# Patient Record
Sex: Male | Born: 1955
Health system: Southern US, Community
[De-identification: ages and names within clinical notes are randomized; demographics above are authoritative.]

## PROBLEM LIST (undated history)

## (undated) DIAGNOSIS — I469 Cardiac arrest, cause unspecified: Secondary | ICD-10-CM

## (undated) DIAGNOSIS — R011 Cardiac murmur, unspecified: Secondary | ICD-10-CM

## (undated) DIAGNOSIS — I2510 Atherosclerotic heart disease of native coronary artery without angina pectoris: Secondary | ICD-10-CM

## (undated) DIAGNOSIS — J449 Chronic obstructive pulmonary disease, unspecified: Secondary | ICD-10-CM

## (undated) DIAGNOSIS — I219 Acute myocardial infarction, unspecified: Secondary | ICD-10-CM

## (undated) DIAGNOSIS — F32A Depression, unspecified: Secondary | ICD-10-CM

## (undated) DIAGNOSIS — K219 Gastro-esophageal reflux disease without esophagitis: Secondary | ICD-10-CM

## (undated) DIAGNOSIS — E119 Type 2 diabetes mellitus without complications: Secondary | ICD-10-CM

## (undated) DIAGNOSIS — N189 Chronic kidney disease, unspecified: Secondary | ICD-10-CM

## (undated) DIAGNOSIS — G4730 Sleep apnea, unspecified: Secondary | ICD-10-CM

## (undated) DIAGNOSIS — E785 Hyperlipidemia, unspecified: Secondary | ICD-10-CM

## (undated) DIAGNOSIS — F1020 Alcohol dependence, uncomplicated: Secondary | ICD-10-CM

## (undated) DIAGNOSIS — M259 Joint disorder, unspecified: Secondary | ICD-10-CM

## (undated) DIAGNOSIS — I495 Sick sinus syndrome: Secondary | ICD-10-CM

## (undated) DIAGNOSIS — G250 Essential tremor: Secondary | ICD-10-CM

## (undated) DIAGNOSIS — J45909 Unspecified asthma, uncomplicated: Secondary | ICD-10-CM

## (undated) DIAGNOSIS — I10 Essential (primary) hypertension: Secondary | ICD-10-CM

## (undated) DIAGNOSIS — I499 Cardiac arrhythmia, unspecified: Secondary | ICD-10-CM

## (undated) DIAGNOSIS — K589 Irritable bowel syndrome without diarrhea: Secondary | ICD-10-CM

## (undated) HISTORY — PX: SHOULDER SURGERY: SHX246

## (undated) HISTORY — PX: CORONARY ANGIOPLASTY WITH STENT PLACEMENT: SHX49

## (undated) HISTORY — PX: SIGMOIDOSCOPY: SHX6686

## (undated) HISTORY — PX: RHINOPLASTY: SUR1284

---

## 2006-01-18 ENCOUNTER — Ambulatory Visit: Payer: Self-pay | Admitting: Internal Medicine

## 2006-03-08 ENCOUNTER — Ambulatory Visit: Payer: Self-pay | Admitting: Internal Medicine

## 2007-02-01 ENCOUNTER — Ambulatory Visit: Payer: Self-pay | Admitting: Rheumatology

## 2007-04-30 ENCOUNTER — Ambulatory Visit: Payer: Self-pay | Admitting: Orthopaedic Surgery

## 2007-06-26 ENCOUNTER — Other Ambulatory Visit: Payer: Self-pay

## 2007-06-26 ENCOUNTER — Emergency Department: Payer: Self-pay | Admitting: Emergency Medicine

## 2007-06-27 ENCOUNTER — Other Ambulatory Visit: Payer: Self-pay

## 2007-07-01 ENCOUNTER — Ambulatory Visit: Payer: Self-pay | Admitting: Emergency Medicine

## 2007-07-11 ENCOUNTER — Inpatient Hospital Stay: Payer: Self-pay | Admitting: Internal Medicine

## 2007-07-11 ENCOUNTER — Other Ambulatory Visit: Payer: Self-pay

## 2008-04-02 ENCOUNTER — Ambulatory Visit: Payer: Medicare Other | Admitting: Internal Medicine

## 2008-05-29 DIAGNOSIS — M259 Joint disorder, unspecified: Secondary | ICD-10-CM

## 2008-05-29 HISTORY — DX: Joint disorder, unspecified: M25.9

## 2009-06-08 ENCOUNTER — Inpatient Hospital Stay: Payer: Self-pay | Admitting: Internal Medicine

## 2010-06-17 ENCOUNTER — Ambulatory Visit: Payer: Self-pay | Admitting: Internal Medicine

## 2011-11-22 DIAGNOSIS — I2510 Atherosclerotic heart disease of native coronary artery without angina pectoris: Secondary | ICD-10-CM | POA: Insufficient documentation

## 2011-12-20 DIAGNOSIS — Z9889 Other specified postprocedural states: Secondary | ICD-10-CM | POA: Insufficient documentation

## 2013-09-24 ENCOUNTER — Ambulatory Visit: Payer: Self-pay | Admitting: Unknown Physician Specialty

## 2013-10-15 ENCOUNTER — Ambulatory Visit: Payer: Self-pay | Admitting: Unknown Physician Specialty

## 2013-10-17 LAB — PATHOLOGY REPORT

## 2013-12-16 ENCOUNTER — Ambulatory Visit: Payer: Self-pay | Admitting: Unknown Physician Specialty

## 2014-03-25 DIAGNOSIS — E119 Type 2 diabetes mellitus without complications: Secondary | ICD-10-CM | POA: Insufficient documentation

## 2014-04-17 DIAGNOSIS — E782 Mixed hyperlipidemia: Secondary | ICD-10-CM | POA: Insufficient documentation

## 2014-10-12 DIAGNOSIS — I10 Essential (primary) hypertension: Secondary | ICD-10-CM | POA: Insufficient documentation

## 2015-03-12 ENCOUNTER — Ambulatory Visit: Payer: 59 | Attending: Specialist

## 2015-03-12 DIAGNOSIS — G4733 Obstructive sleep apnea (adult) (pediatric): Secondary | ICD-10-CM | POA: Insufficient documentation

## 2015-04-03 ENCOUNTER — Ambulatory Visit: Payer: 59 | Attending: Specialist

## 2015-04-03 DIAGNOSIS — G4761 Periodic limb movement disorder: Secondary | ICD-10-CM | POA: Diagnosis not present

## 2015-04-03 DIAGNOSIS — G4733 Obstructive sleep apnea (adult) (pediatric): Secondary | ICD-10-CM | POA: Diagnosis present

## 2016-01-27 ENCOUNTER — Inpatient Hospital Stay
Admission: EM | Admit: 2016-01-27 | Discharge: 2016-01-28 | DRG: 392 | Disposition: A | Discharge status: Home / Self Care | Payer: 59 | Attending: Internal Medicine | Admitting: Internal Medicine

## 2016-01-27 ENCOUNTER — Encounter: Payer: Self-pay | Admitting: Emergency Medicine

## 2016-01-27 ENCOUNTER — Emergency Department: Payer: 59

## 2016-01-27 DIAGNOSIS — Z9889 Other specified postprocedural states: Secondary | ICD-10-CM | POA: Diagnosis not present

## 2016-01-27 DIAGNOSIS — R739 Hyperglycemia, unspecified: Secondary | ICD-10-CM

## 2016-01-27 DIAGNOSIS — N189 Chronic kidney disease, unspecified: Secondary | ICD-10-CM | POA: Diagnosis present

## 2016-01-27 DIAGNOSIS — Z79899 Other long term (current) drug therapy: Secondary | ICD-10-CM | POA: Diagnosis not present

## 2016-01-27 DIAGNOSIS — F1021 Alcohol dependence, in remission: Secondary | ICD-10-CM | POA: Diagnosis present

## 2016-01-27 DIAGNOSIS — F329 Major depressive disorder, single episode, unspecified: Secondary | ICD-10-CM | POA: Diagnosis present

## 2016-01-27 DIAGNOSIS — T380X5A Adverse effect of glucocorticoids and synthetic analogues, initial encounter: Secondary | ICD-10-CM | POA: Diagnosis present

## 2016-01-27 DIAGNOSIS — Z87891 Personal history of nicotine dependence: Secondary | ICD-10-CM

## 2016-01-27 DIAGNOSIS — R1110 Vomiting, unspecified: Secondary | ICD-10-CM

## 2016-01-27 DIAGNOSIS — Z880 Allergy status to penicillin: Secondary | ICD-10-CM

## 2016-01-27 DIAGNOSIS — Z825 Family history of asthma and other chronic lower respiratory diseases: Secondary | ICD-10-CM | POA: Diagnosis not present

## 2016-01-27 DIAGNOSIS — I2510 Atherosclerotic heart disease of native coronary artery without angina pectoris: Secondary | ICD-10-CM | POA: Diagnosis present

## 2016-01-27 DIAGNOSIS — Z955 Presence of coronary angioplasty implant and graft: Secondary | ICD-10-CM | POA: Diagnosis not present

## 2016-01-27 DIAGNOSIS — E1165 Type 2 diabetes mellitus with hyperglycemia: Secondary | ICD-10-CM | POA: Diagnosis present

## 2016-01-27 DIAGNOSIS — Z809 Family history of malignant neoplasm, unspecified: Secondary | ICD-10-CM | POA: Diagnosis not present

## 2016-01-27 DIAGNOSIS — K529 Noninfective gastroenteritis and colitis, unspecified: Secondary | ICD-10-CM | POA: Diagnosis present

## 2016-01-27 DIAGNOSIS — L501 Idiopathic urticaria: Secondary | ICD-10-CM | POA: Diagnosis present

## 2016-01-27 DIAGNOSIS — Z8674 Personal history of sudden cardiac arrest: Secondary | ICD-10-CM | POA: Diagnosis not present

## 2016-01-27 DIAGNOSIS — Z8249 Family history of ischemic heart disease and other diseases of the circulatory system: Secondary | ICD-10-CM | POA: Diagnosis not present

## 2016-01-27 DIAGNOSIS — I129 Hypertensive chronic kidney disease with stage 1 through stage 4 chronic kidney disease, or unspecified chronic kidney disease: Secondary | ICD-10-CM | POA: Diagnosis present

## 2016-01-27 DIAGNOSIS — I252 Old myocardial infarction: Secondary | ICD-10-CM

## 2016-01-27 DIAGNOSIS — J45909 Unspecified asthma, uncomplicated: Secondary | ICD-10-CM | POA: Diagnosis present

## 2016-01-27 DIAGNOSIS — G4733 Obstructive sleep apnea (adult) (pediatric): Secondary | ICD-10-CM | POA: Diagnosis present

## 2016-01-27 DIAGNOSIS — E1122 Type 2 diabetes mellitus with diabetic chronic kidney disease: Secondary | ICD-10-CM | POA: Diagnosis present

## 2016-01-27 HISTORY — DX: Joint disorder, unspecified: M25.9

## 2016-01-27 HISTORY — DX: Cardiac arrest, cause unspecified: I46.9

## 2016-01-27 HISTORY — DX: Unspecified asthma, uncomplicated: J45.909

## 2016-01-27 HISTORY — DX: Atherosclerotic heart disease of native coronary artery without angina pectoris: I25.10

## 2016-01-27 HISTORY — DX: Acute myocardial infarction, unspecified: I21.9

## 2016-01-27 HISTORY — DX: Type 2 diabetes mellitus without complications: E11.9

## 2016-01-27 LAB — CBC
HEMATOCRIT: 51.7 % (ref 40.0–52.0)
HEMOGLOBIN: 18 g/dL (ref 13.0–18.0)
MCH: 30.3 pg (ref 26.0–34.0)
MCHC: 34.8 g/dL (ref 32.0–36.0)
MCV: 87.1 fL (ref 80.0–100.0)
Platelets: 188 10*3/uL (ref 150–440)
RBC: 5.93 MIL/uL — ABNORMAL HIGH (ref 4.40–5.90)
RDW: 15.1 % — ABNORMAL HIGH (ref 11.5–14.5)
WBC: 17.9 10*3/uL — ABNORMAL HIGH (ref 3.8–10.6)

## 2016-01-27 LAB — COMPREHENSIVE METABOLIC PANEL
ALBUMIN: 4.4 g/dL (ref 3.5–5.0)
ALK PHOS: 140 U/L — AB (ref 38–126)
ALT: 46 U/L (ref 17–63)
ANION GAP: 11 (ref 5–15)
AST: 35 U/L (ref 15–41)
BUN: 15 mg/dL (ref 6–20)
CALCIUM: 9.5 mg/dL (ref 8.9–10.3)
CHLORIDE: 103 mmol/L (ref 101–111)
CO2: 22 mmol/L (ref 22–32)
Creatinine, Ser: 1.09 mg/dL (ref 0.61–1.24)
GFR calc Af Amer: >60 mL/min (ref >60)
GFR calc non Af Amer: >60 mL/min (ref >60)
GLUCOSE: 310 mg/dL — AB (ref 65–99)
POTASSIUM: 4.2 mmol/L (ref 3.5–5.1)
SODIUM: 136 mmol/L (ref 135–145)
Total Bilirubin: 1.2 mg/dL (ref 0.3–1.2)
Total Protein: 7.6 g/dL (ref 6.5–8.1)

## 2016-01-27 LAB — URINALYSIS COMPLETE WITH MICROSCOPIC (ARMC ONLY)
BACTERIA UA: NONE SEEN
Bilirubin Urine: NEGATIVE
Glucose, UA: >500 mg/dL — AB
Hgb urine dipstick: NEGATIVE
Leukocytes, UA: NEGATIVE
Nitrite: NEGATIVE
PH: 5 (ref 5.0–8.0)
PROTEIN: 30 mg/dL — AB
SQUAMOUS EPITHELIAL / LPF: NONE SEEN
Specific Gravity, Urine: 1.03 (ref 1.005–1.030)

## 2016-01-27 LAB — GLUCOSE, CAPILLARY
GLUCOSE-CAPILLARY: 308 mg/dL — AB (ref 65–99)
GLUCOSE-CAPILLARY: 190 mg/dL — AB (ref 65–99)
GLUCOSE-CAPILLARY: 164 mg/dL — AB (ref 65–99)
Glucose-Capillary: 252 mg/dL — ABNORMAL HIGH (ref 65–99)

## 2016-01-27 LAB — MAGNESIUM: Magnesium: 1.8 mg/dL (ref 1.7–2.4)

## 2016-01-27 LAB — PHOSPHORUS: PHOSPHORUS: 2.4 mg/dL — AB (ref 2.5–4.6)

## 2016-01-27 LAB — LIPASE, BLOOD: Lipase: 46 U/L (ref 11–51)

## 2016-01-27 MED ORDER — PROMETHAZINE HCL 25 MG/ML IJ SOLN
INTRAMUSCULAR | Status: AC
Start: 2016-01-27 — End: 2016-01-27
  Administered 2016-01-27: 25 mg via INTRAVENOUS
  Filled 2016-01-27: qty 1

## 2016-01-27 MED ORDER — ACETAMINOPHEN 650 MG RE SUPP: 650.0000 mg | Freq: Four times a day (QID) | RECTAL | Status: DC | PRN

## 2016-01-27 MED ORDER — BENAZEPRIL HCL 20 MG PO TABS
20.0000 mg | ORAL_TABLET | Freq: Every day | ORAL | Status: DC
  Administered 2016-01-28: 20 mg via ORAL
  Filled 2016-01-27: qty 1

## 2016-01-27 MED ORDER — HYDROMORPHONE HCL 1 MG/ML IJ SOLN
0.5000 mg | Freq: Once | INTRAMUSCULAR | Status: AC
  Administered 2016-01-27: 0.5 mg via INTRAVENOUS
  Filled 2016-01-27: qty 1

## 2016-01-27 MED ORDER — ADULT MULTIVITAMIN W/MINERALS CH
0.5000 | ORAL_TABLET | Freq: Every day | ORAL | Status: DC
  Administered 2016-01-28: 0.5 via ORAL
  Filled 2016-01-27: qty 1

## 2016-01-27 MED ORDER — METRONIDAZOLE 500 MG PO TABS: 500.0000 mg | ORAL_TABLET | Freq: Two times a day (BID) | ORAL | 0 refills | Status: DC

## 2016-01-27 MED ORDER — ONDANSETRON HCL 4 MG/2ML IJ SOLN: 4.0000 mg | Freq: Once | INTRAMUSCULAR | Status: DC

## 2016-01-27 MED ORDER — IOPAMIDOL (ISOVUE-300) INJECTION 61%
30.0000 mL | Freq: Once | INTRAVENOUS | Status: AC | PRN
  Administered 2016-01-27: 30 mL via ORAL

## 2016-01-27 MED ORDER — MAGNESIUM OXIDE 400 (241.3 MG) MG PO TABS
400.0000 mg | ORAL_TABLET | Freq: Every day | ORAL | Status: DC
  Administered 2016-01-28: 400 mg via ORAL
  Filled 2016-01-27: qty 1

## 2016-01-27 MED ORDER — TESTOSTERONE 50 MG/5GM (1%) TD GEL: 10.0000 g | Freq: Every day | TRANSDERMAL | Status: DC

## 2016-01-27 MED ORDER — LEVOCETIRIZINE DIHYDROCHLORIDE 5 MG PO TABS: 5.0000 mg | ORAL_TABLET | Freq: Every day | ORAL | Status: DC

## 2016-01-27 MED ORDER — SODIUM CHLORIDE 0.9 % IV BOLUS (SEPSIS)
1000.0000 mL | Freq: Once | INTRAVENOUS | Status: AC
  Administered 2016-01-27: 1000 mL via INTRAVENOUS

## 2016-01-27 MED ORDER — SODIUM CHLORIDE 0.9 % IV SOLN
INTRAVENOUS | Status: DC
  Administered 2016-01-27 – 2016-01-28 (×2): via INTRAVENOUS

## 2016-01-27 MED ORDER — INSULIN ASPART 100 UNIT/ML ~~LOC~~ SOLN
SUBCUTANEOUS | Status: AC
  Administered 2016-01-27: 10 [IU] via SUBCUTANEOUS
  Filled 2016-01-27: qty 10

## 2016-01-27 MED ORDER — ONDANSETRON HCL 4 MG/2ML IJ SOLN
4.0000 mg | Freq: Once | INTRAMUSCULAR | Status: AC
  Administered 2016-01-27: 4 mg via INTRAVENOUS

## 2016-01-27 MED ORDER — ONDANSETRON HCL 4 MG/2ML IJ SOLN
INTRAMUSCULAR | Status: AC
  Administered 2016-01-27: 4 mg via INTRAVENOUS
  Filled 2016-01-27: qty 2

## 2016-01-27 MED ORDER — MORPHINE SULFATE (PF) 2 MG/ML IV SOLN: 1.0000 mg | INTRAVENOUS | Status: DC | PRN

## 2016-01-27 MED ORDER — SODIUM CHLORIDE 0.9 % IV BOLUS (SEPSIS)
1000.0000 mL | Freq: Once | INTRAVENOUS | Status: AC
Start: 2016-01-27 — End: 2016-01-27
  Administered 2016-01-27: 1000 mL via INTRAVENOUS

## 2016-01-27 MED ORDER — ASPIRIN EC 81 MG PO TBEC
81.0000 mg | DELAYED_RELEASE_TABLET | Freq: Every day | ORAL | Status: DC
  Administered 2016-01-28: 81 mg via ORAL
  Filled 2016-01-27: qty 1

## 2016-01-27 MED ORDER — ENOXAPARIN SODIUM 40 MG/0.4ML ~~LOC~~ SOLN
40.0000 mg | SUBCUTANEOUS | Status: DC
  Administered 2016-01-27: 40 mg via SUBCUTANEOUS
  Filled 2016-01-27: qty 0.4

## 2016-01-27 MED ORDER — ONDANSETRON 4 MG PO TBDP: 4.0000 mg | ORAL_TABLET | Freq: Three times a day (TID) | ORAL | 0 refills | Status: DC | PRN

## 2016-01-27 MED ORDER — METRONIDAZOLE IN NACL 5-0.79 MG/ML-% IV SOLN
500.0000 mg | Freq: Three times a day (TID) | INTRAVENOUS | Status: DC
  Administered 2016-01-28: 500 mg via INTRAVENOUS
  Filled 2016-01-27 (×3): qty 100

## 2016-01-27 MED ORDER — CIPROFLOXACIN IN D5W 400 MG/200ML IV SOLN
400.0000 mg | Freq: Two times a day (BID) | INTRAVENOUS | Status: DC
  Administered 2016-01-28: 400 mg via INTRAVENOUS
  Filled 2016-01-27 (×3): qty 200

## 2016-01-27 MED ORDER — FAMOTIDINE 20 MG PO TABS
20.0000 mg | ORAL_TABLET | Freq: Every day | ORAL | Status: DC
  Administered 2016-01-27: 20 mg via ORAL
  Filled 2016-01-27: qty 1

## 2016-01-27 MED ORDER — IOPAMIDOL (ISOVUE-300) INJECTION 61%
100.0000 mL | Freq: Once | INTRAVENOUS | Status: AC | PRN
  Administered 2016-01-27: 100 mL via INTRAVENOUS

## 2016-01-27 MED ORDER — INSULIN ASPART 100 UNIT/ML ~~LOC~~ SOLN
0.0000 [IU] | Freq: Three times a day (TID) | SUBCUTANEOUS | Status: DC
  Administered 2016-01-28: 3 [IU] via SUBCUTANEOUS
  Filled 2016-01-27: qty 3

## 2016-01-27 MED ORDER — PROMETHAZINE HCL 25 MG/ML IJ SOLN
25.0000 mg | Freq: Once | INTRAMUSCULAR | Status: AC
  Administered 2016-01-27: 25 mg via INTRAVENOUS

## 2016-01-27 MED ORDER — ACETAMINOPHEN 325 MG PO TABS: 650.0000 mg | ORAL_TABLET | Freq: Four times a day (QID) | ORAL | Status: DC | PRN

## 2016-01-27 MED ORDER — UMECLIDINIUM-VILANTEROL 62.5-25 MCG/INH IN AEPB
1.0000 | INHALATION_SPRAY | Freq: Every day | RESPIRATORY_TRACT | Status: DC
  Administered 2016-01-28: 1 via RESPIRATORY_TRACT
  Filled 2016-01-27: qty 14

## 2016-01-27 MED ORDER — CIPROFLOXACIN HCL 500 MG PO TABS: 500.0000 mg | ORAL_TABLET | Freq: Two times a day (BID) | ORAL | 0 refills | Status: DC

## 2016-01-27 MED ORDER — BISACODYL 5 MG PO TBEC: 5.0000 mg | DELAYED_RELEASE_TABLET | Freq: Every day | ORAL | Status: DC | PRN

## 2016-01-27 MED ORDER — DICYCLOMINE HCL 10 MG PO CAPS
10.0000 mg | ORAL_CAPSULE | Freq: Every day | ORAL | Status: DC
  Administered 2016-01-27: 10 mg via ORAL
  Filled 2016-01-27: qty 1

## 2016-01-27 MED ORDER — ONDANSETRON HCL 4 MG/2ML IJ SOLN
4.0000 mg | Freq: Once | INTRAMUSCULAR | Status: AC | PRN
  Administered 2016-01-27: 4 mg via INTRAVENOUS

## 2016-01-27 MED ORDER — MONTELUKAST SODIUM 10 MG PO TABS
10.0000 mg | ORAL_TABLET | Freq: Every day | ORAL | Status: DC
  Administered 2016-01-27: 10 mg via ORAL
  Filled 2016-01-27: qty 1

## 2016-01-27 MED ORDER — ZOLPIDEM TARTRATE 5 MG PO TABS: 5.0000 mg | ORAL_TABLET | Freq: Every evening | ORAL | Status: DC | PRN

## 2016-01-27 MED ORDER — ONDANSETRON HCL 4 MG/2ML IJ SOLN
INTRAMUSCULAR | Status: AC
  Filled 2016-01-27: qty 2

## 2016-01-27 MED ORDER — CIPROFLOXACIN IN D5W 400 MG/200ML IV SOLN
400.0000 mg | Freq: Once | INTRAVENOUS | Status: AC
  Administered 2016-01-27: 400 mg via INTRAVENOUS

## 2016-01-27 MED ORDER — ATORVASTATIN CALCIUM 20 MG PO TABS
40.0000 mg | ORAL_TABLET | Freq: Every day | ORAL | Status: DC
  Administered 2016-01-28: 40 mg via ORAL
  Filled 2016-01-27: qty 2

## 2016-01-27 MED ORDER — ONDANSETRON HCL 4 MG/2ML IJ SOLN: 4.0000 mg | Freq: Four times a day (QID) | INTRAMUSCULAR | Status: DC | PRN

## 2016-01-27 MED ORDER — LORATADINE 10 MG PO TABS
10.0000 mg | ORAL_TABLET | Freq: Every day | ORAL | Status: DC
  Administered 2016-01-28: 10 mg via ORAL
  Filled 2016-01-27: qty 1

## 2016-01-27 MED ORDER — HYDROCODONE-ACETAMINOPHEN 5-325 MG PO TABS
1.0000 | ORAL_TABLET | ORAL | Status: DC | PRN
  Administered 2016-01-27: 2 via ORAL
  Filled 2016-01-27: qty 2

## 2016-01-27 MED ORDER — MORPHINE SULFATE (PF) 2 MG/ML IV SOLN: 2.0000 mg | INTRAVENOUS | Status: DC | PRN

## 2016-01-27 MED ORDER — METOCLOPRAMIDE HCL 5 MG/ML IJ SOLN
10.0000 mg | Freq: Once | INTRAMUSCULAR | Status: AC
  Administered 2016-01-27: 10 mg via INTRAVENOUS
  Filled 2016-01-27: qty 2

## 2016-01-27 MED ORDER — HYDROXYZINE HCL 25 MG PO TABS
50.0000 mg | ORAL_TABLET | Freq: Every day | ORAL | Status: DC
  Administered 2016-01-27: 50 mg via ORAL
  Filled 2016-01-27: qty 2

## 2016-01-27 MED ORDER — SENNOSIDES-DOCUSATE SODIUM 8.6-50 MG PO TABS: 1.0000 | ORAL_TABLET | Freq: Every evening | ORAL | Status: DC | PRN

## 2016-01-27 MED ORDER — METRONIDAZOLE IN NACL 5-0.79 MG/ML-% IV SOLN
500.0000 mg | Freq: Once | INTRAVENOUS | Status: AC
  Administered 2016-01-27: 500 mg via INTRAVENOUS
  Filled 2016-01-27: qty 100

## 2016-01-27 MED ORDER — ALBUTEROL SULFATE (2.5 MG/3ML) 0.083% IN NEBU
2.5000 mg | INHALATION_SOLUTION | Freq: Every day | RESPIRATORY_TRACT | Status: DC
  Administered 2016-01-28: 2.5 mg via RESPIRATORY_TRACT
  Filled 2016-01-27: qty 3

## 2016-01-27 MED ORDER — METOCLOPRAMIDE HCL 10 MG PO TABS: 10.0000 mg | ORAL_TABLET | Freq: Three times a day (TID) | ORAL | 0 refills | Status: DC | PRN

## 2016-01-27 MED ORDER — MAGNESIUM CITRATE PO SOLN
1.0000 | Freq: Once | ORAL | Status: DC | PRN
  Filled 2016-01-27: qty 296

## 2016-01-27 MED ORDER — INSULIN ASPART 100 UNIT/ML ~~LOC~~ SOLN
10.0000 [IU] | Freq: Once | SUBCUTANEOUS | Status: AC
  Administered 2016-01-27: 10 [IU] via SUBCUTANEOUS

## 2016-01-27 MED ORDER — GABAPENTIN 600 MG PO TABS
600.0000 mg | ORAL_TABLET | Freq: Every day | ORAL | Status: DC
  Administered 2016-01-28: 600 mg via ORAL
  Filled 2016-01-27: qty 1

## 2016-01-27 MED ORDER — ONDANSETRON HCL 4 MG PO TABS: 4.0000 mg | ORAL_TABLET | Freq: Four times a day (QID) | ORAL | Status: DC | PRN

## 2016-01-27 MED ORDER — PANTOPRAZOLE SODIUM 40 MG PO TBEC
40.0000 mg | DELAYED_RELEASE_TABLET | Freq: Every day | ORAL | Status: DC
  Administered 2016-01-28: 40 mg via ORAL
  Filled 2016-01-27: qty 1

## 2016-01-27 MED ORDER — INSULIN ASPART 100 UNIT/ML ~~LOC~~ SOLN: 0.0000 [IU] | Freq: Every day | SUBCUTANEOUS | Status: DC

## 2016-01-27 NOTE — ED Notes (Signed)
PT reports nausea/ vomiting from drinking contrast, EDP made aware and VORB received for phenergan

## 2016-01-27 NOTE — Progress Notes (Signed)
Pharmacy Antibiotic Note  Frank Wong is a 60 y.o. male admitted on 01/27/2016 with IAI.  Pharmacy has been consulted for ciprofloxacin dosing.  Plan: Ciprofloxacin 400 mg IV Q12H  Height: 5\' 8"  (172.7 cm) Weight: 183 lb 1.6 oz (83.1 kg) IBW/kg (Calculated) : 68.4  Temp (24hrs), Avg:98.5 F (36.9 C), Min:98 F (36.7 C), Max:98.9 F (37.2 C)   Recent Labs Lab 01/27/16 1618  WBC 17.9*  CREATININE 1.09    Estimated Creatinine Clearance: 78.6 mL/min (by C-G formula based on SCr of 1.09 mg/dL).    Allergies  Allergen Reactions  . Penicillins Other (See Comments)    Has patient had a PCN reaction causing immediate rash, facial/tongue/throat swelling, SOB or lightheadedness with hypotension: No Has patient had a PCN reaction causing severe rash involving mucus membranes or skin necrosis: No Has patient had a PCN reaction that required hospitalization No Has patient had a PCN reaction occurring within the last 10 years: No If all of the above answers are "NO", then may proceed with Cephalosporin use.     Thank you for allowing pharmacy to be a part of this patient's care.  Laural Benes, Pharm.D., BCPS Clinical Pharmacist 01/27/2016 10:21 PM

## 2016-01-27 NOTE — H&P (Signed)
[place] @ [facility] Admission History and Physical Harvie Bridge, D.O.  ---------------------------------------------------------------------------------------------------------------------   PATIENT NAME: Frank Wong MR#: [id number] DATE OF BIRTH: August 09, 1955 DATE OF ADMISSION: 01/27/2016 PRIMARY CARE PHYSICIAN: Madelyn Brunner, MD  REQUESTING/REFERRING PHYSICIAN: ED Dr. Mariea Clonts  CHIEF COMPLAINT: Chief Complaint  Patient presents with  . Abdominal Pain  . Emesis    HISTORY OF PRESENT ILLNESS: Frank Wong is a 60 y.o. male with a known history of DM, CAD, MI, asthma, idiopathic hives was in a usual state of health until yesterday evening when he began to feel nauseous.  This morning he had one episode of loose stool and then vomited several times, nonbloody nonbilious.  Patient reports that he has been experiencing idiopathic urticaria including hives to his upper extremities and trunk for the past year. He was recently had an accident exacerbation and was placed on a course of prednisone. His blood sugars have been elevated up as high as the 400s in the last couple of days.  Otherwise there has been no change in status. Patient has been taking medication as prescribed and there has been no recent change in medication or diet.  There has been no recent illness, travel or sick contacts.    In the ED, patient received Cipro, Flagyl.  Zofran, IVFs and the plan was to treat with by mouth antibiotics as an outpatient however he had intractable vomiting. Hospitalists were therefore contacted requesting admission to treat his enteritis with IV medication as well as for intractable vomiting.  Patient denies fevers/chills, weakness, dizziness, chest pain, shortness of breath, abdominal pain, dysuria/frequency, changes in mental status.   PAST MEDICAL HISTORY: Past Medical History:  Diagnosis Date  . Asthma   . CAD (coronary artery disease)   . Cardiac arrest ([place])    . Diabetes mellitus without complication ([place])   . Heart attack ([place])     . Alcoholism (CMS-HCC)  . Asthma, exercise induced  . Cardiac arrhythmia  . Chronic epigastric pain 11/24/2013  . Chronic kidney disease  . Coronary artery disease  . Depression  . Diabetes mellitus type 2, diet-controlled (CMS-HCC)  . Epididymal pain 11/24/2013  . Essential tremor  . Fingers fractured 1970 1971  bilateral  . Gastritis 2015 EGD  . GERD (gastroesophageal reflux disease) 2009  . H. pylori infection  . Heart attack (CMS-HCC) 2010  . Hyperlipemia  . Hypertension  . Internal hemorrhoids  . Multiple gastric erosions 11/24/2013  . Renal insufficiency  . Sinoatrial node dysfunction (CMS-HCC)  . Sleep apnea, obstructive  uses BI-PAP, instructed to bring on DOS  . Tendinitis of left rotator cuff  . Tremor 2012  . Wears glasses       PAST SURGICAL HISTORY: Past Surgical History:  Procedure Laterality Date  . CORONARY ANGIOPLASTY WITH STENT PLACEMENT    . RHINOPLASTY    . SHOULDER SURGERY     . ARTHROSCOPIC SUBACROMIAL DECOMP Right 11/23/2011  Procedure: ARTHROSCOPIC SUBACROMIAL DECOMP; Surgeon: Park Liter, MD; Location: [facility] OR; Service: Orthopedics; Laterality: Right; revision due to recurrent symptoms  . ARTHROSCOPY SHOULDER Right 2008  seizure, cardiac arrest, respiratory arrest post block  . ARTHROSCOPY SHOULDER W/DEBRIDEMENT Right 11/23/2011  Procedure: ARTHROSCOPY SHOULDER W/DEBRIDEMENT; Surgeon: Park Liter, MD; Location: [facility] OR; Service: Orthopedics; Laterality: Right; with biceps tenotomy  . CARDIAC CATHETERIZATION  . cardiac stent 2011  single  . COLONOSCOPY 03/10/2003  Hyperplastic Polyps  . COLONOSCOPY 10/15/2013  Int Hemorrhoids: CBF 09/2023  . CORONARY ANGIOPLASTY  .  deviated septum  . EGD 03/10/2003  No repeat per RTE  . EGD 10/15/2013  H Pylori  . SHOULDER SURGERY Right 11/06/2007  Debridement labrum, SAD        SOCIAL HISTORY: Social History   Substance Use Topics  . Smoking status: Former Research scientist (life sciences)  . Smokeless tobacco: Not on file  . Alcohol use No  . Smoking status: Former Smoker  Packs/day: 1.50  Years: 23.00  Types: Cigarettes  Quit date: 11/22/1994  . Smokeless tobacco: Former Systems developer  Types: Chew  . Alcohol use No  Comment: hx of ETOH abuse; quit 1985  . Drug use: No  Comment: muscle relaxants. Quit 1985  . Sexual activity: Defer      FAMILY HISTORY: . Hypertension Brother  . Emphysema Mother  . Ulcers Father  stomach  . Hypertension Brother  . Ulcerative colitis Son  . Cancer Maternal Grandmother    MEDICATIONS AT HOME: Prior to Admission medications   Medication Sig Start Date End Date Taking? Authorizing Provider  ciprofloxacin (CIPRO) 500 MG tablet Take 1 tablet (500 mg total) by mouth 2 (two) times daily. 01/27/16 02/06/16  Anne-Caroline Mariea Clonts, MD  metoCLOPramide (REGLAN) 10 MG tablet Take 1 tablet (10 mg total) by mouth every 8 (eight) hours as needed for nausea or vomiting. 01/27/16 01/26/17  Eula Listen, MD  metroNIDAZOLE (FLAGYL) 500 MG tablet Take 1 tablet (500 mg total) by mouth 2 (two) times daily. 01/27/16   Anne-Caroline Mariea Clonts, MD  ondansetron (ZOFRAN ODT) 4 MG disintegrating tablet Take 1 tablet (4 mg total) by mouth every 8 (eight) hours as needed for nausea or vomiting. 01/27/16   Eula Listen, MD    . albuterol 90 mcg/actuation inhaler, 2 puff qid prn, Disp: 1 Inhaler, Rfl: 6 . aspirin 81 mg tablet, 1 tab by mouth daily, Disp: , Rfl:  . atorvastatin (LIPITOR) 40 MG tablet, TAKE 1 TABLET (40 MG TOTAL) BY MOUTH ONCE DAILY., Disp: , Rfl: 4 . B INFANTIS/B ANI/B LON/B BIFID (PROBIOTIC 4X ORAL), Take by mouth., Disp: , Rfl:  . benazepril (LOTENSIN) 20 MG tablet, TAKE 1 TABLET BY MOUTH ONCE A DAY FOR BLOOD PRESSURE., Disp: 30 tablet, Rfl: 4 . clobetasol (CORMAX) 0.05 % external solution, Apply topically as needed. , Disp: , Rfl:  . dicyclomine (BENTYL) 10 mg capsule, TAKE 1 CAPSULE (10 MG  TOTAL) BY MOUTH ONCE DAILY AS NEEDED (ONLY TAKE AS NEEDED)., Disp: 60 capsule, Rfl: 0 . doxepin (SINEQUAN) 25 MG capsule, TAKE 1 CAPSULE BY MOUTH AT BEDTIME MAY MAKE DROWSY, Disp: , Rfl: 1 . fexofenadine (ALLEGRA) 180 MG tablet, Take 180 mg by mouth once daily., Disp: , Rfl:  . gabapentin (NEURONTIN) 600 MG tablet, Take 1 tablet (600 mg total) by mouth once daily., Disp: 90 tablet, Rfl: 3 . hydrocortisone (ANUSOL-HC) 2.5 % rectal cream, Apply topically 2 (two) times daily. Apply a small amount to affected area bid prn, Disp: , Rfl:  . hydrocortisone-iodoquinl-aloe2 (ALCORTIN A) 2-1-1 % Gel, Apply topically., Disp: , Rfl:  . hydrOXYzine (ATARAX) 25 MG tablet, Take 25 mg by mouth once daily as needed for Itching., Disp: , Rfl:  . levocetirizine (XYZAL) 5 MG tablet, Take 5 mg by mouth every evening., Disp: , Rfl:  . magnesium sulfate 500 mg/mL oral solution, Take 500 mg by mouth once., Disp: , Rfl:  . metFORMIN (GLUCOPHAGE) 500 MG tablet, Take 1 tablet (500 mg total) by mouth 2 (two) times daily with meals., Disp: 60 tablet, Rfl: 11 . montelukast (SINGULAIR) 10 mg tablet,  Take 10 mg by mouth nightly., Disp: , Rfl:  . nystatin-triamcinolone cream, Apply topically 2 (two) times daily., Disp: 30 g, Rfl: 3 . ranitidine (ZANTAC) 150 MG capsule, Take 150 mg by mouth 2 (two) times daily., Disp: , Rfl:  . ranitidine (ZANTAC) 150 MG tablet, Take 150 mg by mouth nightly., Disp: , Rfl: 2 . sildenafil (REVATIO) 20 mg tablet, Take 20 mg by mouth once daily., Disp: , Rfl:  . testosterone 20.25 mg/1.25 gram (1.62 %) GlPm, , Disp: , Rfl:  . triamcinolone 0.1 % cream, Apply to affected area bid until clear., Disp: 30 g, Rfl: 3 . umeclidinium-vilanterol (ANORO ELLIPTA) 62.5-25 mcg/actuation inhaler, Inhale 1 inhalation into the lungs once daily., Disp: 1 Inhaler, Rfl: 6 . UNABLE TO FIND, BiPap Machine Max IPAP of 25 Min EPAP of 10 Max pressure support of 8, Disp: 1 Unspecified, Rfl: 0 . VAYACOG 100-19.5-6.5 mg Cap,  TAKE 1 CAPSULE BY MOUTH ONCE DAILY.-NOT COVERED LEFT MESSAGE, Disp: 30 capsule, Rfl: 3      DRUG ALLERGIES: Allergies  Allergen Reactions  . Penicillins      REVIEW OF SYSTEMS: CONSTITUTIONAL: No fever/chills, fatigue, weakness, weight gain/loss, headache EYES: No blurry or double vision. ENT: No tinnitus, postnasal drip, redness or soreness of the oropharynx. RESPIRATORY: No cough, wheeze, hemoptysis, dyspnea. CARDIOVASCULAR: No chest pain, orthopnea, palpitations, syncope. GASTROINTESTINAL: No nausea, vomiting, constipation, diarrhea, abdominal pain, hematemesis, melena or hematochezia. GENITOURINARY: No dysuria or hematuria. ENDOCRINE: No polyuria or nocturia. No heat or cold intolerance. HEMATOLOGY: No anemia, bruising, bleeding. INTEGUMENTARY: No rashes, ulcers, lesions. MUSCULOSKELETAL: No arthritis, swelling, gout. NEUROLOGIC: No numbness, tingling, weakness or ataxia. No seizure-type activity. PSYCHIATRIC: No anxiety, depression, insomnia.  PHYSICAL EXAMINATION: VITAL SIGNS: Blood pressure 132/76, pulse (!) 102, temperature 98 F (36.7 C), temperature source Oral, resp. rate 18, height 5\' 8"  (1.727 m), weight 81.2 kg (179 lb), SpO2 94 %.  GENERAL: 60 y.o.-year-old white male patient, well-developed, well-nourished lying in the bed in no acute distress.  Pleasant and cooperative.   HEENT: Head atraumatic, normocephalic. Pupils equal, round, reactive to light and accommodation. No scleral icterus. Extraocular muscles intact. Nares are patent. Oropharynx is clear. Mucus membranes moist. NECK: Supple, full range of motion. No JVD, no bruit heard. No thyroid enlargement, no tenderness, no cervical lymphadenopathy. CHEST: Normal breath sounds bilaterally. No wheezing, rales, rhonchi or crackles. No use of accessory muscles of respiration.  No reproducible chest wall tenderness.  CARDIOVASCULAR: S1, S2 normal. No murmurs, rubs, or gallops. Cap refill <2 seconds. ABDOMEN: Soft,  nontender, nondistended. No rebound, guarding, rigidity. Normoactive bowel sounds present in all four quadrants. No organomegaly or mass. EXTREMITIES: Full range of motion. No pedal edema, cyanosis, or clubbing. NEUROLOGIC: Cranial nerves II through XII are grossly intact with no focal sensorimotor deficit. Muscle strength 5/5 in all extremities. Sensation intact. Gait not checked. PSYCHIATRIC: The patient is alert and oriented x 3. Normal affect, mood, thought content. SKIN: Warm, dry, and intact without obvious rash, lesion, or ulcer.  LABORATORY PANEL:  CBC  Recent Labs Lab 01/27/16 1618  WBC 17.9*  HGB 18.0  HCT 51.7  PLT 188   ----------------------------------------------------------------------------------------------------------------- Chemistries  Recent Labs Lab 01/27/16 1618  NA 136  K 4.2  CL 103  CO2 22  GLUCOSE 310*  BUN 15  CREATININE 1.09  CALCIUM 9.5  AST 35  ALT 46  ALKPHOS 140*  BILITOT 1.2   ------------------------------------------------------------------------------------------------------------------ Cardiac Enzymes No results for input(s): TROPONINI in the last 168 hours. ------------------------------------------------------------------------------------------------------------------  RADIOLOGY: Ct  Abdomen Pelvis W Contrast  Result Date: 01/27/2016 CLINICAL DATA:  60 year old male with acute abdominal pain, nausea and vomiting today. EXAM: CT ABDOMEN AND PELVIS WITH CONTRAST TECHNIQUE: Multidetector CT imaging of the abdomen and pelvis was performed using the standard protocol following bolus administration of intravenous contrast. CONTRAST:  170mL ISOVUE-300 IOPAMIDOL (ISOVUE-300) INJECTION 61% COMPARISON:  12/16/2013 CT FINDINGS: Lower chest:  Unremarkable Hepatobiliary: The liver and gallbladder are unremarkable. There is no evidence of biliary dilatation. Pancreas: Unremarkable Spleen: Unremarkable Adrenals/Urinary Tract: The kidneys, adrenal  glands and bladder are unremarkable except for left renal cysts. Stomach/Bowel: Nondistended fluid-filled loops of small bowel with possible mild small bowel wall thickening in the central and right lower abdomen identified. There is no evidence of bowel obstruction. The appendix is normal. Vascular/Lymphatic: Abdominal aortic atherosclerotic calcifications noted without aneurysm. No enlarged lymph nodes identified. Reproductive: Unremarkable Other: No free fluid, abscess or pneumoperitoneum. Musculoskeletal: No acute or suspicious abnormality. IMPRESSION: Nondistended fluid-filled loops of small bowel with possible mild small bowel wall thickening -question enteritis. No evidence of bowel obstruction. No other acute abnormalities noted. Abdominal aortic atherosclerosis. Electronically Signed   By: Margarette Canada M.D.   On: 01/27/2016 19:32    EKG: NSR@ 97bpm, nonspecific ST T wave changes.    IMPRESSION AND PLAN:  This is a 60 y.o. male with a history of DM, HTN, CAD, Asthma, idiopathic urticaria now being admitted with: 1. Enteritis-Admit patient for IV fluid hydration, IV Cipro and Flagyl, stool studies, pain control. Patient may have a clear liquid diet 2. Intractable vomiting secondary to #1-antiemetics and IV fluid hydration 3. Hyperglycemia secondary to infection and steroids tight glucose control with insulin sliding scale before meals and at bedtime. We'll be holding his metformin during his hospitalization. 4. HTN continue home medications 5. CAD continue home medications 6. Asthma continue home medications 7. Idiopathic urticaria-continue home medications   Diet/Nutrition: Clear liquids, heart healthy, card controlled Fluids: IVNS DVT Px: Lovenox, SCDs and early ambulation Code Status: Full  All the records are reviewed and case discussed with ED provider. Management plans discussed with the patient and his family who express understanding and agree with plan of care.   TOTAL TIME  TAKING CARE OF THIS PATIENT: 45 minutes.   Deonna Krummel D.O. on 01/27/2016 at 9:03 PM Between 7am to 6pm - Pager - [phone] After 6pm go to [email or web address] - [employer] [place] [facility] Office [phone] CC: Primary care physician; Madelyn Brunner, MD     Note: This dictation was prepared with Dragon dictation along with smaller phrase technology. Any transcriptional errors that result from this process are unintentional.

## 2016-01-27 NOTE — ED Notes (Signed)
Patient transported to CT 

## 2016-01-27 NOTE — ED Provider Notes (Signed)
[facility] Emergency Department Provider Note  ____________________________________________  Time seen: Approximately 5:23 PM  I have reviewed the triage vital signs and the nursing notes.   HISTORY  Chief Complaint Abdominal Pain and Emesis    HPI Frank Wong is a 60 y.o. male with a history of DM, CAD presenting with nausea, vomiting, diarrhea and hyperglycemia. The patient reports that several weeks ago he was prescribed prednisone for idiosyncratic hives. His hives have resolved and he has not been on prednisone recently, but he has noticed that his blood sugars have been variable between 89 and 500. His morning,  the patient developed some nausea and vomiting, one episode of loose nonbloody stool, and some mild lower abdominal pain. He has not had any fever, chills, urinary symptoms. No back pain. Recent changes in his IBD's medications. He was given a liter of intravenous fluids and Zofran in triage, and his nausea has completely resolved. He has no known sick contacts and no travel outside the [place].    Past Medical History:  Diagnosis Date  . Asthma   . CAD (coronary artery disease)   . Cardiac arrest ([place])   . Diabetes mellitus without complication ([place])   . Heart attack ([place])     There are no active problems to display for this patient.   Past Surgical History:  Procedure Laterality Date  . CORONARY ANGIOPLASTY WITH STENT PLACEMENT    . RHINOPLASTY    . SHOULDER SURGERY        Allergies Penicillins  History reviewed. No pertinent family history.  Social History Social History  Substance Use Topics  . Smoking status: Former Research scientist (life sciences)  . Smokeless tobacco: Not on file  . Alcohol use No    Review of Systems Constitutional: No fever/chills. no lightheadedness or syncope.  Eyes: No visual changes. ENT: No sore throat. No congestion or rhinorrhea. Cardiovascular: Denies chest pain. Denies palpitations. Respiratory: Denies  shortness of breath.  No cough. GI: Pos lower abd pain.  Pos n/v and diarrhea.  Neg constipation Genitourinary: Negative for dysuria.  No flank pain Musculoskeletal: Negative for back pain. Skin: Negative for rash. Neurological: Negative for headaches. No focal numbness, tingling or weakness.   10-point ROS otherwise negative.  ____________________________________________   PHYSICAL EXAM:  VITAL SIGNS: ED Triage Vitals  Enc Vitals Group     BP 01/27/16 1604 116/76     Pulse Rate 01/27/16 1604 (!) 107     Resp 01/27/16 1604 18     Temp 01/27/16 1604 98 F (36.7 C)     Temp Source 01/27/16 1604 Oral     SpO2 01/27/16 1604 97 %     Weight 01/27/16 1609 179 lb (81.2 kg)     Height 01/27/16 1609 5\' 8"  (1.727 m)     Head Circumference --      Peak Flow --      Pain Score 01/27/16 1609 6     Pain Loc --      Pain Edu? --      Excl. in [place]? --     Constitutional: Alert and oriented. Well appearing and in no acute distress. Answers questions appropriately. Eyes: Conjunctivae are normal.  EOMI. No scleral icterus. Head: Atraumatic. Nose: No congestion/rhinnorhea. Mouth/Throat: Mucous membranes are moist.  Neck: No stridor.  Supple.  No JVD. Cardiovascular: Normal rate, regular rhythm. No murmurs, rubs or gallops.  Respiratory: Normal respiratory effort.  No accessory muscle use or retractions. Lungs CTAB.  No wheezes,  rales or ronchi. Gastrointestinal: Soft and nondistended. tender to palpation in the right and left lower quadrant without focality.   No guarding or rebound.  No peritoneal signs. Musculoskeletal: No LE edema. . Neurologic:  A&Ox3.  Speech is clear.  Face and smile are symmetric.  EOMI.  Moves all extremities well. Skin:  Skin is warm, dry and intact. No rash noted. No jaundice Psychiatric: Mood and affect are normal. Speech and behavior are normal.  Normal judgement.  ____________________________________________   LABS (all labs ordered are listed, but only  abnormal results are displayed)  Labs Reviewed  COMPREHENSIVE METABOLIC PANEL - Abnormal; Notable for the following:       Result Value   Glucose, Bld 310 (*)    Alkaline Phosphatase 140 (*)    All other components within normal limits  CBC - Abnormal; Notable for the following:    WBC 17.9 (*)    RBC 5.93 (*)    RDW 15.1 (*)    All other components within normal limits  URINALYSIS COMPLETEWITH MICROSCOPIC ([facility] ONLY) - Abnormal; Notable for the following:    Color, Urine YELLOW (*)    APPearance CLEAR (*)    Glucose, UA >500 (*)    Ketones, ur TRACE (*)    Protein, ur 30 (*)    All other components within normal limits  GLUCOSE, CAPILLARY - Abnormal; Notable for the following:    Glucose-Capillary 308 (*)    All other components within normal limits  GLUCOSE, CAPILLARY - Abnormal; Notable for the following:    Glucose-Capillary 252 (*)    All other components within normal limits  GLUCOSE, CAPILLARY - Abnormal; Notable for the following:    Glucose-Capillary 190 (*)    All other components within normal limits  LIPASE, BLOOD   ____________________________________________  EKG  ED ECG REPORT I, Eula Listen, the attending physician, personally viewed and interpreted this ECG.   Date: 01/27/2016  EKG Time: 1619  Rate: 97  Rhythm: normal sinus rhythm  Axis: Normal  Intervals:none  ST&T Change: No ST elevation.  ____________________________________________  RADIOLOGY  Ct Abdomen Pelvis W Contrast  Result Date: 01/27/2016 CLINICAL DATA:  61 year old male with acute abdominal pain, nausea and vomiting today. EXAM: CT ABDOMEN AND PELVIS WITH CONTRAST TECHNIQUE: Multidetector CT imaging of the abdomen and pelvis was performed using the standard protocol following bolus administration of intravenous contrast. CONTRAST:  172mL ISOVUE-300 IOPAMIDOL (ISOVUE-300) INJECTION 61% COMPARISON:  12/16/2013 CT FINDINGS: Lower chest:  Unremarkable Hepatobiliary: The liver  and gallbladder are unremarkable. There is no evidence of biliary dilatation. Pancreas: Unremarkable Spleen: Unremarkable Adrenals/Urinary Tract: The kidneys, adrenal glands and bladder are unremarkable except for left renal cysts. Stomach/Bowel: Nondistended fluid-filled loops of small bowel with possible mild small bowel wall thickening in the central and right lower abdomen identified. There is no evidence of bowel obstruction. The appendix is normal. Vascular/Lymphatic: Abdominal aortic atherosclerotic calcifications noted without aneurysm. No enlarged lymph nodes identified. Reproductive: Unremarkable Other: No free fluid, abscess or pneumoperitoneum. Musculoskeletal: No acute or suspicious abnormality. IMPRESSION: Nondistended fluid-filled loops of small bowel with possible mild small bowel wall thickening -question enteritis. No evidence of bowel obstruction. No other acute abnormalities noted. Abdominal aortic atherosclerosis. Electronically Signed   By: Margarette Canada M.D.   On: 01/27/2016 19:32    ____________________________________________   PROCEDURES  Procedure(s) performed: None  Procedures  Critical Care performed: No ____________________________________________   INITIAL IMPRESSION / ASSESSMENT AND PLAN / ED COURSE  Pertinent labs & imaging results  that were available during my care of the patient were reviewed by me and considered in my medical decision making (see chart for details).  60 y.o. male with a history of diabetes, CAD presenting with 1 day of nausea vomiting and diarrhea with lower abdominal pain. The patient does not have a fever here, but he does have an elevated white blood cell count at 17 and tenderness on my examination. I'll get a CT scan to evaluate for intra-abdominal abscess or infection, diverticulitis. Other possible etiologies include a viral or foodborne GI illness. The patient's symptoms have significantly improved, and I will continue to work on  improving his hyperglycemia.  ----------------------------------------- 7:51 PM on 01/27/2016 -----------------------------------------  At this time, the patient is still experiencing some nausea although his pain has significantly improved. His CT scan shows a diffuse enteritis without any focal complication including perforation or abscess. I will treat him with IV ciprofloxacin and Flagyl, as well as continued antibiotic. If he is able to tolerate by mouth. We'll plan discharge home with symptomatic treatment and antibiotics. If he continues to vomit, plan to admit him to the hospital for further treatment.  ----------------------------------------- 8:02 PM on 01/27/2016 -----------------------------------------  The patient has failed his by mouth challenge and will require admission to the hospital for further treatment.  ____________________________________________  FINAL CLINICAL IMPRESSION(S) / ED DIAGNOSES  Final diagnoses:  Enteritis  Hyperglycemia    Clinical Course      NEW MEDICATIONS STARTED DURING THIS VISIT:  New Prescriptions   CIPROFLOXACIN (CIPRO) 500 MG TABLET    Take 1 tablet (500 mg total) by mouth 2 (two) times daily.   METOCLOPRAMIDE (REGLAN) 10 MG TABLET    Take 1 tablet (10 mg total) by mouth every 8 (eight) hours as needed for nausea or vomiting.   METRONIDAZOLE (FLAGYL) 500 MG TABLET    Take 1 tablet (500 mg total) by mouth 2 (two) times daily.   ONDANSETRON (ZOFRAN ODT) 4 MG DISINTEGRATING TABLET    Take 1 tablet (4 mg total) by mouth every 8 (eight) hours as needed for nausea or vomiting.      Eula Listen, MD 01/27/16 2008

## 2016-01-27 NOTE — ED Triage Notes (Signed)
Pt c/o nausea and vomiting that started today. Is type II DM. Reports sugars over 400 at home. Blood sugar 308 in ED. Vomiting in triage and now diaphoretic.

## 2016-01-27 NOTE — ED Notes (Signed)
Pt vomited large amounts of yellow bile/fluid

## 2016-01-28 LAB — C DIFFICILE QUICK SCREEN W PCR REFLEX
C DIFFICILE (CDIFF) TOXIN: NEGATIVE
C Diff antigen: NEGATIVE
C Diff interpretation: NOT DETECTED

## 2016-01-28 LAB — BASIC METABOLIC PANEL
Anion gap: 6 (ref 5–15)
BUN: 13 mg/dL (ref 6–20)
CHLORIDE: 110 mmol/L (ref 101–111)
CO2: 24 mmol/L (ref 22–32)
CREATININE: 1.07 mg/dL (ref 0.61–1.24)
Calcium: 7.6 mg/dL — ABNORMAL LOW (ref 8.9–10.3)
Glucose, Bld: 145 mg/dL — ABNORMAL HIGH (ref 65–99)
POTASSIUM: 3.6 mmol/L (ref 3.5–5.1)
SODIUM: 140 mmol/L (ref 135–145)

## 2016-01-28 LAB — GASTROINTESTINAL PANEL BY PCR, STOOL (REPLACES STOOL CULTURE)
ADENOVIRUS F40/41: NOT DETECTED
ASTROVIRUS: NOT DETECTED
CRYPTOSPORIDIUM: NOT DETECTED
Campylobacter species: NOT DETECTED
Cyclospora cayetanensis: NOT DETECTED
ENTEROAGGREGATIVE E COLI (EAEC): NOT DETECTED
ENTEROPATHOGENIC E COLI (EPEC): NOT DETECTED
Entamoeba histolytica: NOT DETECTED
Enterotoxigenic E coli (ETEC): NOT DETECTED
GIARDIA LAMBLIA: NOT DETECTED
Norovirus GI/GII: DETECTED — AB
Plesimonas shigelloides: NOT DETECTED
ROTAVIRUS A: NOT DETECTED
Salmonella species: NOT DETECTED
Sapovirus (I, II, IV, and V): NOT DETECTED
Shiga like toxin producing E coli (STEC): NOT DETECTED
Shigella/Enteroinvasive E coli (EIEC): NOT DETECTED
VIBRIO SPECIES: NOT DETECTED
Vibrio cholerae: NOT DETECTED
YERSINIA ENTEROCOLITICA: NOT DETECTED

## 2016-01-28 LAB — GLUCOSE, CAPILLARY: GLUCOSE-CAPILLARY: 138 mg/dL — AB (ref 65–99)

## 2016-01-28 LAB — CBC
HCT: 37.2 % — ABNORMAL LOW (ref 40.0–52.0)
Hemoglobin: 13.4 g/dL (ref 13.0–18.0)
MCH: 31.2 pg (ref 26.0–34.0)
MCHC: 35.9 g/dL (ref 32.0–36.0)
MCV: 86.9 fL (ref 80.0–100.0)
PLATELETS: 138 10*3/uL — AB (ref 150–440)
RBC: 4.28 MIL/uL — AB (ref 4.40–5.90)
RDW: 15 % — AB (ref 11.5–14.5)
WBC: 8.7 10*3/uL (ref 3.8–10.6)

## 2016-01-28 MED ORDER — CIPROFLOXACIN HCL 500 MG PO TABS: 500.0000 mg | ORAL_TABLET | Freq: Two times a day (BID) | ORAL | 0 refills | Status: AC

## 2016-01-28 NOTE — Progress Notes (Signed)
Following discharge, lab called with a critical value that patient's stool tested positive for norovirus, Dr. Gloriajean Dell notified via telephone and notified that patient has already been discharged, MD acknowledged.

## 2016-01-28 NOTE — Discharge Instructions (Signed)
°  DIET:  °Diabetic diet ° °DISCHARGE CONDITION:  °Stable ° °ACTIVITY:  °Activity as tolerated ° °OXYGEN:  °Home Oxygen: No. °  °Oxygen Delivery: room air ° °DISCHARGE LOCATION:  °home  ° ° °ADDITIONAL DISCHARGE INSTRUCTION: ° ° °If you experience worsening of your admission symptoms, develop shortness of breath, life threatening emergency, suicidal or homicidal thoughts you must seek medical attention immediately by calling 911 or calling your MD immediately  if symptoms less severe. ° °You Must read complete instructions/literature along with all the possible adverse reactions/side effects for all the Medicines you take and that have been prescribed to you. Take any new Medicines after you have completely understood and accpet all the possible adverse reactions/side effects.  ° °Please note ° °You were cared for by a hospitalist during your hospital stay. If you have any questions about your discharge medications or the care you received while you were in the hospital after you are discharged, you can call the unit and asked to speak with the hospitalist on call if the hospitalist that took care of you is not available. Once you are discharged, your primary care physician will handle any further medical issues. Please note that NO REFILLS for any discharge medications will be authorized once you are discharged, as it is imperative that you return to your primary care physician (or establish a relationship with a primary care physician if you do not have one) for your aftercare needs so that they can reassess your need for medications and monitor your lab values. ° ° °

## 2016-01-28 NOTE — Progress Notes (Signed)
Pharmacy Antibiotic Note  Frank Wong is a 60 y.o. male admitted on 01/27/2016 with IAI.  Pharmacy has been consulted for ciprofloxacin dosing.  Day #2 of antibiotics.   Plan: Continue Ciprofloxacin 400 mg IV Q12H and flagyl 500mg  IV Q8h.  Height: 5\' 8"  (172.7 cm) Weight: 183 lb 1.6 oz (83.1 kg) IBW/kg (Calculated) : 68.4  Temp (24hrs), Avg:98.3 F (36.8 C), Min:98 F (36.7 C), Max:98.9 F (37.2 C)   Recent Labs Lab 01/27/16 1618 01/28/16 0522  WBC 17.9* 8.7  CREATININE 1.09 1.07    Estimated Creatinine Clearance: 80 mL/min (by C-G formula based on SCr of 1.07 mg/dL).    Allergies  Allergen Reactions  . Penicillins Other (See Comments)    Has patient had a PCN reaction causing immediate rash, facial/tongue/throat swelling, SOB or lightheadedness with hypotension: No Has patient had a PCN reaction causing severe rash involving mucus membranes or skin necrosis: No Has patient had a PCN reaction that required hospitalization No Has patient had a PCN reaction occurring within the last 10 years: No If all of the above answers are "NO", then may proceed with Cephalosporin use.    9/1: C. Diff test: in process 9/1: GI Panel: in process  Thank you for allowing pharmacy to be a part of this patient's care.  Loree Fee, Pharm.D., BCPS Clinical Pharmacist 01/28/2016 10:38 AM

## 2016-01-28 NOTE — Progress Notes (Signed)
Patient is alert and oriented x 4, ambulatory in room, 1 loose bm in am sent for specimen, c.diff negative, wife at bedside, on room air, denies n/v or pain, tolerating diet, d/c to home on po cipro, medications sent to pt's pharmacy, f/u appt with patient's pcp scheduled, reviewed d/c instructions with patient and patient's wife, both verbalize understanding of d/c instructions and denies further questions at this time. Discharged to home via wife, pushed to visitor entrance via nursing staff. Uneventful shift.

## 2016-01-28 NOTE — Discharge Summary (Signed)
Frank Wong, 60 y.o., DOB 08/26/1955, MRN [id number]. Admission date: 01/27/2016 Discharge Date 01/28/2016 Primary MD Madelyn Brunner, MD Admitting Physician Harvie Bridge, DO  Admission Diagnosis  Enteritis [K52.9] Hyperglycemia [R73.9] Intractable vomiting with nausea, vomiting of unspecified type [R11.10]  Discharge Diagnosis   Active Problems:   Enteritis presumed infectious asthma Coronary artery disease Diabetes type 2 History of alcoholism Chronic kidney disease Depression       Hospital Course  Frank Wong is a 60 y.o. male with a known history of DM, CAD, MI, asthma, idiopathic hives was in a usual state of health until yesterday evening when he began to feel nauseous.  This morning he had one episode of loose stool and then vomited several times, nonbloody nonbilious.Marland Kitchen He did have a CT scan of the abdomen which showed possible entritis. He was given IV fluids and supportive care his symptoms are much improved and he wants to be discharged home today.         Consults  None  Significant Tests:  See full reports for all details     Ct Abdomen Pelvis W Contrast  Result Date: 01/27/2016 CLINICAL DATA:  60 year old male with acute abdominal pain, nausea and vomiting today. EXAM: CT ABDOMEN AND PELVIS WITH CONTRAST TECHNIQUE: Multidetector CT imaging of the abdomen and pelvis was performed using the standard protocol following bolus administration of intravenous contrast. CONTRAST:  181mL ISOVUE-300 IOPAMIDOL (ISOVUE-300) INJECTION 61% COMPARISON:  12/16/2013 CT FINDINGS: Lower chest:  Unremarkable Hepatobiliary: The liver and gallbladder are unremarkable. There is no evidence of biliary dilatation. Pancreas: Unremarkable Spleen: Unremarkable Adrenals/Urinary Tract: The kidneys, adrenal glands and bladder are unremarkable except for left renal cysts. Stomach/Bowel: Nondistended fluid-filled loops of small bowel with possible mild small bowel wall thickening in the  central and right lower abdomen identified. There is no evidence of bowel obstruction. The appendix is normal. Vascular/Lymphatic: Abdominal aortic atherosclerotic calcifications noted without aneurysm. No enlarged lymph nodes identified. Reproductive: Unremarkable Other: No free fluid, abscess or pneumoperitoneum. Musculoskeletal: No acute or suspicious abnormality. IMPRESSION: Nondistended fluid-filled loops of small bowel with possible mild small bowel wall thickening -question enteritis. No evidence of bowel obstruction. No other acute abnormalities noted. Abdominal aortic atherosclerosis. Electronically Signed   By: Margarette Canada M.D.   On: 01/27/2016 19:32       Today   Subjective:   Frank Wong  Feeling better nausea vomting resolved  Objective:   Blood pressure 103/60, pulse 72, temperature 98.2 F (36.8 C), temperature source Oral, resp. rate 18, height 5\' 8"  (1.727 m), weight 183 lb 1.6 oz (83.1 kg), SpO2 97 %.  .  Intake/Output Summary (Last 24 hours) at 01/28/16 1349 Last data filed at 01/28/16 0948  Gross per 24 hour  Intake           781.67 ml  Output              900 ml  Net          -118.33 ml    Exam VITAL SIGNS: Blood pressure 103/60, pulse 72, temperature 98.2 F (36.8 C), temperature source Oral, resp. rate 18, height 5\' 8"  (1.727 m), weight 183 lb 1.6 oz (83.1 kg), SpO2 97 %.  GENERAL:  60 y.o.-year-old patient lying in the bed with no acute distress.  EYES: Pupils equal, round, reactive to light and accommodation. No scleral icterus. Extraocular muscles intact.  HEENT: Head atraumatic, normocephalic. Oropharynx and nasopharynx clear.  NECK:  Supple, no jugular venous distention. No  thyroid enlargement, no tenderness.  LUNGS: Normal breath sounds bilaterally, no wheezing, rales,rhonchi or crepitation. No use of accessory muscles of respiration.  CARDIOVASCULAR: S1, S2 normal. No murmurs, rubs, or gallops.  ABDOMEN: Soft, nontender, nondistended. Bowel sounds  present. No organomegaly or mass.  EXTREMITIES: No pedal edema, cyanosis, or clubbing.  NEUROLOGIC: Cranial nerves II through XII are intact. Muscle strength 5/5 in all extremities. Sensation intact. Gait not checked.  PSYCHIATRIC: The patient is alert and oriented x 3.  SKIN: No obvious rash, lesion, or ulcer.   Data Review     CBC w Diff: Lab Results  Component Value Date   WBC 8.7 01/28/2016   HGB 13.4 01/28/2016   HCT 37.2 (L) 01/28/2016   PLT 138 (L) 01/28/2016   CMP: Lab Results  Component Value Date   NA 140 01/28/2016   K 3.6 01/28/2016   CL 110 01/28/2016   CO2 24 01/28/2016   BUN 13 01/28/2016   CREATININE 1.07 01/28/2016   PROT 7.6 01/27/2016   ALBUMIN 4.4 01/27/2016   BILITOT 1.2 01/27/2016   ALKPHOS 140 (H) 01/27/2016   AST 35 01/27/2016   ALT 46 01/27/2016  .  Micro Results Recent Results (from the past 240 hour(s))  Gastrointestinal Panel by PCR , Stool     Status: Abnormal   Collection Time: 01/28/16  9:15 AM  Result Value Ref Range Status   Campylobacter species NOT DETECTED NOT DETECTED Final   Plesimonas shigelloides NOT DETECTED NOT DETECTED Final   Salmonella species NOT DETECTED NOT DETECTED Final   Yersinia enterocolitica NOT DETECTED NOT DETECTED Final   Vibrio species NOT DETECTED NOT DETECTED Final   Vibrio cholerae NOT DETECTED NOT DETECTED Final   Enteroaggregative E coli (EAEC) NOT DETECTED NOT DETECTED Final   Enteropathogenic E coli (EPEC) NOT DETECTED NOT DETECTED Final   Enterotoxigenic E coli (ETEC) NOT DETECTED NOT DETECTED Final   Shiga like toxin producing E coli (STEC) NOT DETECTED NOT DETECTED Final   Shigella/Enteroinvasive E coli (EIEC) NOT DETECTED NOT DETECTED Final   Cryptosporidium NOT DETECTED NOT DETECTED Final   Cyclospora cayetanensis NOT DETECTED NOT DETECTED Final   Entamoeba histolytica NOT DETECTED NOT DETECTED Final   Giardia lamblia NOT DETECTED NOT DETECTED Final   Adenovirus F40/41 NOT DETECTED NOT DETECTED  Final   Astrovirus NOT DETECTED NOT DETECTED Final   Norovirus GI/GII DETECTED (A) NOT DETECTED Final    Comment: CRITICAL RESULT CALLED TO, READ BACK BY AND VERIFIED WITH: BRANDI DAVENPORT 01/28/16 1255 SGD    Rotavirus A NOT DETECTED NOT DETECTED Final   Sapovirus (I, II, IV, and V) NOT DETECTED NOT DETECTED Final  C difficile quick scan w PCR reflex     Status: None   Collection Time: 01/28/16  9:15 AM  Result Value Ref Range Status   C Diff antigen NEGATIVE NEGATIVE Final   C Diff toxin NEGATIVE NEGATIVE Final   C Diff interpretation No C. difficile detected.  Final        Code Status Orders        Start     Ordered   01/27/16 2220  Full code  Continuous     01/27/16 2219    Code Status History    Date Active Date Inactive Code Status Order ID Comments User Context   This patient has a current code status but no historical code status.    Advance Directive Documentation   Flowsheet Row Most Recent Value  Type of Advance Directive  Living will  Pre-existing out of facility DNR order (yellow form or pink MOST form)  No data  "MOST" Form in Place?  No data          Follow-up Information    Madelyn Brunner, MD On 02/04/2016.   Specialty:  Internal Medicine Why:  Friday at 11:00am for hospital follow-up Contact information: [address] [place] [address] [phone]           Discharge Medications     Medication List    TAKE these medications   albuterol 108 (90 Base) MCG/ACT inhaler Commonly known as:  PROVENTIL HFA;VENTOLIN HFA Inhale 1 puff into the lungs daily.   aspirin EC 81 MG tablet Take 81 mg by mouth daily.   atorvastatin 40 MG tablet Commonly known as:  LIPITOR Take 40 mg by mouth daily.   benazepril 20 MG tablet Commonly known as:  LOTENSIN Take 1 tablet by mouth daily.   ciprofloxacin 500 MG tablet Commonly known as:  CIPRO Take 1 tablet (500 mg total) by mouth 2 (two) times daily.    dicyclomine 10 MG capsule Commonly known as:  BENTYL Take 10 mg by mouth at bedtime.   fexofenadine 180 MG tablet Commonly known as:  ALLEGRA Take 180 mg by mouth daily.   gabapentin 600 MG tablet Commonly known as:  NEURONTIN Take 600 mg by mouth daily.   hydrOXYzine 25 MG tablet Commonly known as:  ATARAX/VISTARIL Take 50 mg by mouth at bedtime.   lansoprazole 15 MG capsule Commonly known as:  PREVACID Take 30 mg by mouth daily at 12 noon.   levocetirizine 5 MG tablet Commonly known as:  XYZAL Take 5 mg by mouth daily.   magnesium oxide 400 MG tablet Commonly known as:  MAG-OX Take 400 mg by mouth daily.   metFORMIN 500 MG tablet Commonly known as:  GLUCOPHAGE Take 500 mg by mouth 2 (two) times daily with a meal.   montelukast 10 MG tablet Commonly known as:  SINGULAIR Take 10 mg by mouth at bedtime.   multivitamin with minerals Tabs tablet Take 0.5 tablets by mouth daily.   ondansetron 4 MG disintegrating tablet Commonly known as:  ZOFRAN ODT Take 1 tablet (4 mg total) by mouth every 8 (eight) hours as needed for nausea or vomiting.   ranitidine 150 MG tablet Commonly known as:  ZANTAC Take 150 mg by mouth at bedtime.   testosterone 50 MG/5GM (1%) Gel Commonly known as:  ANDROGEL Place 10 g onto the skin daily.   umeclidinium-vilanterol 62.5-25 MCG/INH Aepb Commonly known as:  ANORO ELLIPTA Inhale 1 puff into the lungs daily.          Total Time in preparing paper work, data evaluation and todays exam - 35 minutes  Dustin Flock M.D on 01/28/2016 at 1:49 PM  [facility]   Office  [phone]

## 2016-04-07 DIAGNOSIS — M25512 Pain in left shoulder: Secondary | ICD-10-CM | POA: Insufficient documentation

## 2016-04-07 DIAGNOSIS — G8929 Other chronic pain: Secondary | ICD-10-CM | POA: Insufficient documentation

## 2016-10-03 ENCOUNTER — Other Ambulatory Visit: Payer: Self-pay | Admitting: Nurse Practitioner

## 2016-10-03 ENCOUNTER — Ambulatory Visit
Admission: RE | Admit: 2016-10-03 | Discharge: 2016-10-03 | Disposition: A | Discharge status: Home / Self Care | Payer: 59 | Source: Ambulatory Visit | Attending: Nurse Practitioner | Admitting: Nurse Practitioner

## 2016-10-03 ENCOUNTER — Other Ambulatory Visit
Admission: RE | Admit: 2016-10-03 | Discharge: 2016-10-03 | Disposition: A | Discharge status: Home / Self Care | Payer: 59 | Source: Ambulatory Visit | Attending: Nurse Practitioner | Admitting: Nurse Practitioner

## 2016-10-03 DIAGNOSIS — I700 Atherosclerosis of aorta: Secondary | ICD-10-CM | POA: Insufficient documentation

## 2016-10-03 DIAGNOSIS — R1084 Generalized abdominal pain: Secondary | ICD-10-CM | POA: Diagnosis present

## 2016-10-03 DIAGNOSIS — R197 Diarrhea, unspecified: Secondary | ICD-10-CM | POA: Diagnosis present

## 2016-10-03 HISTORY — DX: Essential (primary) hypertension: I10

## 2016-10-03 LAB — GASTROINTESTINAL PANEL BY PCR, STOOL (REPLACES STOOL CULTURE)

## 2016-10-03 LAB — C DIFFICILE QUICK SCREEN W PCR REFLEX
C Diff antigen: NEGATIVE
C Diff interpretation: NOT DETECTED
C Diff toxin: NEGATIVE

## 2016-10-03 LAB — POCT I-STAT CREATININE: Creatinine, Ser: 1.1 mg/dL (ref 0.61–1.24)

## 2016-10-03 MED ORDER — IOPAMIDOL (ISOVUE-300) INJECTION 61%
100.0000 mL | Freq: Once | INTRAVENOUS | Status: AC | PRN
  Administered 2016-10-03: 100 mL via INTRAVENOUS

## 2016-12-01 ENCOUNTER — Encounter (INDEPENDENT_AMBULATORY_CARE_PROVIDER_SITE_OTHER): Payer: Self-pay

## 2016-12-06 ENCOUNTER — Other Ambulatory Visit (INDEPENDENT_AMBULATORY_CARE_PROVIDER_SITE_OTHER): Payer: Self-pay | Admitting: Nurse Practitioner

## 2016-12-06 DIAGNOSIS — R109 Unspecified abdominal pain: Secondary | ICD-10-CM

## 2016-12-07 ENCOUNTER — Ambulatory Visit (INDEPENDENT_AMBULATORY_CARE_PROVIDER_SITE_OTHER): Payer: 59

## 2016-12-07 DIAGNOSIS — R109 Unspecified abdominal pain: Secondary | ICD-10-CM | POA: Diagnosis not present

## 2017-03-15 ENCOUNTER — Other Ambulatory Visit: Payer: Self-pay | Admitting: Otolaryngology

## 2017-03-15 DIAGNOSIS — R1312 Dysphagia, oropharyngeal phase: Secondary | ICD-10-CM

## 2017-04-03 ENCOUNTER — Ambulatory Visit
Admission: RE | Admit: 2017-04-03 | Discharge: 2017-04-03 | Disposition: A | Discharge status: Home / Self Care | Payer: 59 | Source: Ambulatory Visit | Attending: Otolaryngology | Admitting: Otolaryngology

## 2017-04-03 DIAGNOSIS — R1312 Dysphagia, oropharyngeal phase: Secondary | ICD-10-CM

## 2017-04-03 DIAGNOSIS — R1310 Dysphagia, unspecified: Secondary | ICD-10-CM | POA: Diagnosis present

## 2017-04-23 NOTE — Therapy (Addendum)
[facility] [place], [place], [address] Phone: [phone]   Fax:     Modified Barium Swallow  Patient Details  Name: Frank Wong MRN: [id number] Date of Birth: [birth year] No Data Recorded  Encounter Date: 04/03/2017    Past Medical History:  Diagnosis Date  . Asthma   . CAD (coronary artery disease)   . Cardiac arrest ([place])   . Diabetes mellitus without complication ([place])   . Heart attack ([place])   . Hypertension   . Shoulder problem 2010   right    Past Surgical History:  Procedure Laterality Date  . CORONARY ANGIOPLASTY WITH STENT PLACEMENT    . RHINOPLASTY    . SHOULDER SURGERY      There were no vitals filed for this visit.      Subjective: Patient behavior: (alertness, ability to follow instructions, etc.): pt was alert, talkative. He followed commands and engaged easily w/ SLP. 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  Chief complaint: dysphagia. Pt described ongoing difficulty swallowing solids moreso than liquids - mild-mod in severity for 6+ months. He does have Reflux and is on a PPI per chart notes. He has been consulted by GI and ENT.    Objective:  Radiological Procedure: A videoflouroscopic evaluation of oral-preparatory, reflex initiation, and pharyngeal phases of the swallow was performed; as well as a screening of the upper esophageal phase.  I. POSTURE: upright II. VIEW: lateral III. COMPENSATORY STRATEGIES: none indicated IV. BOLUSES ADMINISTERED:  Thin Liquid: 5 trials  Nectar-thick Liquid: 1 trial  Honey-thick Liquid: NT  Puree: 3 trials  Mechanical Soft: 2 trials V. RESULTS OF EVALUATION: A. ORAL PREPARATORY PHASE: (The lips, tongue, and velum are observed for strength and coordination)       **Overall Severity Rating: WFL. Pt exhibited timely mastication and bolus  management for A-P transfer; full oral clearing post swallow w/ all consistencies.  B. SWALLOW INITIATION/REFLEX: (The reflex is normal if "triggered" by the time the bolus reached the base of the tongue)  **Overall Severity Rating: grossly WFL. The pharyngeal swallow initiation appeared to trigger at the level of the Valleculae w/ the majority of the bolus trials and consistencies; intermittently at the [facility] w/ the increased solid texture consistency given. Suspect pt's baseline of Reflux could increase the possibility of Laryngopharyngeal Reflux Diseae (LPR) in which the inflammation and edema from the effects of the Reflux could decrease sensation of the larynx and pharynx thus impacting timing of the pharyngeal swallow.   C. PHARYNGEAL PHASE: (Pharyngeal function is normal if the bolus shows rapid, smooth, and continuous transit through the pharynx and there is no pharyngeal residue after the swallow)  **Overall Severity Rating: [facility]. Pt exhibited no pharyngeal residue post swallowing w/ the trial consistencies indicating adequate laryngeal excursion and pharyngeal pressure during the swallowing.   D. LARYNGEAL PENETRATION: (Material entering into the laryngeal inlet/vestibule but not aspirated): NONE E. ASPIRATION: NONE F. ESOPHAGEAL PHASE: (Screening of the upper esophagus): Noted min Esophageal dysmotility in the Cervical Esophagus w/ bolus stasis intermittently. Of note, there appeared to be min narrowing of the Cervical Esophagus at about the C5-C6 level d/t what appeared to be bony protrusions of the vertebrae into the Esophageal space. This could be the feeling of "difficulty swallowing" and food "getting stuck" that pt has been complaining about.   ASSESSMENT: Pt appears to present w/  adequate oropharyngeal phase swallow function w/ reduced risk for prandial aspiration(oropharyngeal); however, pt does have Esophageal dysmotility and baseline h/o GERD/REFLUX per chart notes. During  the Oral phase, pt exhibited timely mastication and bolus management w/ appropriately A-P transfer and clearing of bolus material w/ each consistency. During the Pharyngeal phase, adequate, timely pharyngeal swallow initiation was noted w/ all trial consistencies; trials appeared to trigger the pharyngeal swallow at the level of the Valleculae w/ the majority of the bolus trials and consistencies; intermittently at the [facility] w/ the increased solid texture consistency given. Suspect pt's baseline of Reflux could increase the possibility of Laryngopharyngeal Reflux Diseae (LPR) in which the inflammation and edema from the effects of the Reflux could decrease sensation of the larynx and pharynx thus impacting timing of the pharyngeal swallow.  No pharyngeal residue remained post swallowing indicating adequate laryngeal excursion and pharyngeal pressure during the swallow. No laryngeal penetration or aspiration noted during this study. During the Esophageal Phase of swallowing, however, noted min. Esophageal dysmotility in the Cervical Esophagus w/ bolus stasis occurring intermittently. Of note, there appeared to be min narrowing of the Cervical Esophagus at about the C5-C6 level d/t what appeared to be bony protrusions of the vertebrae into the Esophageal space. This could be the feeling of "difficulty swallowing" and food "getting stuck" that pt has been complaining about.     PLAN/RECOMMENDATIONS:  A. Diet: regular/mech soft diet consistency(meats cut small, moistened foods); Thin liquids. Small bites of foods chewed thoroughly and eaten slowly.  B. Swallowing Precautions: general aspiration precautions; REFLUX Precautions.   C. Recommended consultation to continue f/u w/ GI for management of Esophageal dysmotility; Reflux s/s.   D. Therapy recommendations: none.  E. Results and recommendations were discussed w/ pt; video viewed. Education given.          Oropharyngeal dysphagia - Plan: DG  Swallowing Func-Speech Pathology, DG Swallowing Func-Speech Pathology        Problem List Patient Active Problem List   Diagnosis Date Noted  . Enteritis presumed infectious 01/27/2016    Rema Lievanos 04/23/2017, 3:16 PM  [facility] [place], [place], [address] Phone: [phone]   Fax:     Name: ATIBA KIMBERLIN MRN: [id number] Date of Birth: April 16, 1956

## 2017-04-27 ENCOUNTER — Ambulatory Visit: Admit: 2017-04-27 | Payer: 59 | Admitting: Unknown Physician Specialty

## 2017-04-27 SURGERY — ESOPHAGOGASTRODUODENOSCOPY (EGD) WITH PROPOFOL
Anesthesia: General

## 2017-06-07 DIAGNOSIS — J300 Vasomotor rhinitis: Secondary | ICD-10-CM | POA: Diagnosis not present

## 2017-06-07 DIAGNOSIS — J4540 Moderate persistent asthma, uncomplicated: Secondary | ICD-10-CM | POA: Diagnosis not present

## 2017-06-07 DIAGNOSIS — H1045 Other chronic allergic conjunctivitis: Secondary | ICD-10-CM | POA: Diagnosis not present

## 2017-06-07 DIAGNOSIS — R21 Rash and other nonspecific skin eruption: Secondary | ICD-10-CM | POA: Diagnosis not present

## 2017-06-11 ENCOUNTER — Other Ambulatory Visit: Payer: Self-pay

## 2017-06-11 ENCOUNTER — Ambulatory Visit: Payer: 59 | Admitting: Anesthesiology

## 2017-06-11 ENCOUNTER — Ambulatory Visit
Admission: RE | Admit: 2017-06-11 | Discharge: 2017-06-11 | Disposition: A | Discharge status: Home / Self Care | Payer: 59 | Source: Ambulatory Visit | Attending: Unknown Physician Specialty | Admitting: Unknown Physician Specialty

## 2017-06-11 ENCOUNTER — Encounter: Payer: Self-pay | Admitting: Student

## 2017-06-11 ENCOUNTER — Encounter
Admission: RE | Disposition: A | Discharge status: Home / Self Care | Payer: Self-pay | Source: Ambulatory Visit | Attending: Unknown Physician Specialty

## 2017-06-11 DIAGNOSIS — Z7984 Long term (current) use of oral hypoglycemic drugs: Secondary | ICD-10-CM | POA: Diagnosis not present

## 2017-06-11 DIAGNOSIS — K219 Gastro-esophageal reflux disease without esophagitis: Secondary | ICD-10-CM | POA: Diagnosis not present

## 2017-06-11 DIAGNOSIS — K2970 Gastritis, unspecified, without bleeding: Secondary | ICD-10-CM | POA: Diagnosis not present

## 2017-06-11 DIAGNOSIS — Z8674 Personal history of sudden cardiac arrest: Secondary | ICD-10-CM | POA: Diagnosis not present

## 2017-06-11 DIAGNOSIS — I252 Old myocardial infarction: Secondary | ICD-10-CM | POA: Insufficient documentation

## 2017-06-11 DIAGNOSIS — K3189 Other diseases of stomach and duodenum: Secondary | ICD-10-CM | POA: Diagnosis not present

## 2017-06-11 DIAGNOSIS — I2510 Atherosclerotic heart disease of native coronary artery without angina pectoris: Secondary | ICD-10-CM | POA: Diagnosis not present

## 2017-06-11 DIAGNOSIS — Z87891 Personal history of nicotine dependence: Secondary | ICD-10-CM | POA: Diagnosis not present

## 2017-06-11 DIAGNOSIS — K2960 Other gastritis without bleeding: Secondary | ICD-10-CM | POA: Diagnosis not present

## 2017-06-11 DIAGNOSIS — I10 Essential (primary) hypertension: Secondary | ICD-10-CM | POA: Insufficient documentation

## 2017-06-11 DIAGNOSIS — R1310 Dysphagia, unspecified: Secondary | ICD-10-CM | POA: Diagnosis present

## 2017-06-11 DIAGNOSIS — E119 Type 2 diabetes mellitus without complications: Secondary | ICD-10-CM | POA: Diagnosis not present

## 2017-06-11 DIAGNOSIS — J45909 Unspecified asthma, uncomplicated: Secondary | ICD-10-CM | POA: Diagnosis not present

## 2017-06-11 DIAGNOSIS — G4730 Sleep apnea, unspecified: Secondary | ICD-10-CM | POA: Diagnosis not present

## 2017-06-11 DIAGNOSIS — Z955 Presence of coronary angioplasty implant and graft: Secondary | ICD-10-CM | POA: Insufficient documentation

## 2017-06-11 DIAGNOSIS — Z79899 Other long term (current) drug therapy: Secondary | ICD-10-CM | POA: Diagnosis not present

## 2017-06-11 DIAGNOSIS — K2900 Acute gastritis without bleeding: Secondary | ICD-10-CM | POA: Diagnosis not present

## 2017-06-11 DIAGNOSIS — Z7982 Long term (current) use of aspirin: Secondary | ICD-10-CM | POA: Insufficient documentation

## 2017-06-11 HISTORY — DX: Gastro-esophageal reflux disease without esophagitis: K21.9

## 2017-06-11 HISTORY — PX: ESOPHAGOGASTRODUODENOSCOPY (EGD) WITH PROPOFOL: SHX5813

## 2017-06-11 HISTORY — PX: BALLOON DILATION: SHX5330

## 2017-06-11 HISTORY — DX: Sleep apnea, unspecified: G47.30

## 2017-06-11 LAB — GLUCOSE, CAPILLARY
GLUCOSE-CAPILLARY: 284 mg/dL — AB (ref 65–99)
Glucose-Capillary: 258 mg/dL — ABNORMAL HIGH (ref 65–99)

## 2017-06-11 SURGERY — ESOPHAGOGASTRODUODENOSCOPY (EGD) WITH PROPOFOL
Anesthesia: General

## 2017-06-11 MED ORDER — FENTANYL CITRATE (PF) 100 MCG/2ML IJ SOLN
INTRAMUSCULAR | Status: DC | PRN
  Administered 2017-06-11: 50 ug via INTRAVENOUS
  Administered 2017-06-11 (×2): 25 ug via INTRAVENOUS

## 2017-06-11 MED ORDER — SODIUM CHLORIDE 0.9 % IV SOLN
INTRAVENOUS | Status: DC
  Administered 2017-06-11: 10:00:00 via INTRAVENOUS

## 2017-06-11 MED ORDER — LIDOCAINE HCL (CARDIAC) 20 MG/ML IV SOLN
INTRAVENOUS | Status: DC | PRN
  Administered 2017-06-11: 30 mg via INTRAVENOUS

## 2017-06-11 MED ORDER — PROPOFOL 500 MG/50ML IV EMUL
INTRAVENOUS | Status: DC | PRN
  Administered 2017-06-11: 120 ug/kg/min via INTRAVENOUS

## 2017-06-11 MED ORDER — BUTAMBEN-TETRACAINE-BENZOCAINE 2-2-14 % EX AERO
INHALATION_SPRAY | CUTANEOUS | Status: AC
  Filled 2017-06-11: qty 5

## 2017-06-11 MED ORDER — BUTAMBEN-TETRACAINE-BENZOCAINE 2-2-14 % EX AERO
INHALATION_SPRAY | CUTANEOUS | Status: DC | PRN
  Administered 2017-06-11: 2 via TOPICAL

## 2017-06-11 MED ORDER — LIDOCAINE HCL (PF) 2 % IJ SOLN
INTRAMUSCULAR | Status: AC
  Filled 2017-06-11: qty 10

## 2017-06-11 MED ORDER — PROPOFOL 500 MG/50ML IV EMUL
INTRAVENOUS | Status: AC
  Filled 2017-06-11: qty 50

## 2017-06-11 MED ORDER — MIDAZOLAM HCL 2 MG/2ML IJ SOLN
INTRAMUSCULAR | Status: DC | PRN
  Administered 2017-06-11: 2 mg via INTRAVENOUS

## 2017-06-11 MED ORDER — SODIUM CHLORIDE 0.9 % IV SOLN: INTRAVENOUS | Status: DC

## 2017-06-11 MED ORDER — MIDAZOLAM HCL 2 MG/2ML IJ SOLN
INTRAMUSCULAR | Status: AC
  Filled 2017-06-11: qty 2

## 2017-06-11 MED ORDER — FENTANYL CITRATE (PF) 100 MCG/2ML IJ SOLN
INTRAMUSCULAR | Status: AC
  Filled 2017-06-11: qty 2

## 2017-06-11 NOTE — H&P (Signed)
Primary Care Physician:  Katheren Shams Primary Gastroenterologist:  Dr. Vira Agar  Pre-Procedure History & Physical: HPI:  Frank Wong is a 62 y.o. male is here for an endoscopy.   Past Medical History:  Diagnosis Date  . Asthma   . CAD (coronary artery disease)   . Cardiac arrest ([place])   . Diabetes mellitus without complication ([place])   . GERD (gastroesophageal reflux disease)   . Heart attack ([place])   . Hypertension   . Shoulder problem 2010   right  . Sleep apnea     Past Surgical History:  Procedure Laterality Date  . CORONARY ANGIOPLASTY WITH STENT PLACEMENT    . RHINOPLASTY    . SHOULDER SURGERY      Prior to Admission medications   Medication Sig Start Date End Date Taking? Authorizing Provider  albuterol (PROVENTIL HFA;VENTOLIN HFA) 108 (90 Base) MCG/ACT inhaler Inhale 1 puff into the lungs daily.   Yes [provider]  aspirin EC 81 MG tablet Take 81 mg by mouth daily.   Yes [provider]  atorvastatin (LIPITOR) 40 MG tablet Take 40 mg by mouth daily.   Yes [provider]  benazepril (LOTENSIN) 20 MG tablet Take 1 tablet by mouth daily. 07/18/15  Yes [provider]  dexlansoprazole (DEXILANT) 60 MG capsule Take 60 mg by mouth daily.   Yes [provider]  dicyclomine (BENTYL) 10 MG capsule Take 10 mg by mouth at bedtime.   Yes [provider]  fexofenadine (ALLEGRA) 180 MG tablet Take 180 mg by mouth daily.   Yes [provider]  gabapentin (NEURONTIN) 600 MG tablet Take 600 mg by mouth daily.   Yes [provider]  hydrOXYzine (ATARAX/VISTARIL) 25 MG tablet Take 50 mg by mouth at bedtime.   Yes [provider]  lansoprazole (PREVACID) 15 MG capsule Take 30 mg by mouth daily at 12 noon.   Yes [provider]  levocetirizine (XYZAL) 5 MG tablet Take 5 mg by mouth daily.   Yes [provider]  magnesium oxide (MAG-OX) 400 MG tablet Take 400 mg by mouth daily.    Yes [provider]  metFORMIN (GLUCOPHAGE) 500 MG tablet Take 500 mg by mouth 2 (two) times daily with a meal.   Yes [provider]  montelukast (SINGULAIR) 10 MG tablet Take 10 mg by mouth at bedtime.   Yes [provider]  ranitidine (ZANTAC) 150 MG tablet Take 150 mg by mouth at bedtime.   Yes [provider]  testosterone (ANDROGEL) 50 MG/5GM (1%) GEL Place 10 g onto the skin daily.    Yes [provider]  Multiple Vitamin (MULTIVITAMIN WITH MINERALS) TABS tablet Take 0.5 tablets by mouth daily.    [provider]  ondansetron (ZOFRAN ODT) 4 MG disintegrating tablet Take 1 tablet (4 mg total) by mouth every 8 (eight) hours as needed for nausea or vomiting. Patient not taking: Reported on 06/11/2017 01/27/16   Eula Listen, MD  umeclidinium-vilanterol [facility] ELLIPTA) 62.5-25 MCG/INH AEPB Inhale 1 puff into the lungs daily. 10/14/15   [provider]    Allergies as of 04/18/2017 - Review Complete 10/03/2016  Allergen Reaction Noted  . Penicillins Other (See Comments) 01/27/2016    History reviewed. No pertinent family history.  Social History   Socioeconomic History  . Marital status: Married    Spouse name: Not on file  . Number of children: Not on file  . Years of education: Not on file  .  Highest education level: Not on file  Social Needs  . Financial resource strain: Not on file  . Food insecurity - worry: Not on file  . Food insecurity - inability: Not on file  . Transportation needs - medical: Not on file  . Transportation needs - non-medical: Not on file  Occupational History  . Not on file  Tobacco Use  . Smoking status: Former Research scientist (life sciences)  . Smokeless tobacco: Former Network engineer and Sexual Activity  . Alcohol use: No  . Drug use: No  . Sexual activity: Not on file  Other Topics Concern  . Not on file  Social History Narrative  . Not on file    Review of Systems: See HPI, otherwise  negative ROS  Physical Exam: BP 130/87   Pulse 72   Temp (!) 96.5 F (35.8 C) (Tympanic)   Resp 16   Ht 5\' 8"  (1.727 m)   Wt 87.1 kg (192 lb)   SpO2 99%   BMI 29.19 kg/m  General:   Alert,  pleasant and cooperative in NAD Head:  Normocephalic and atraumatic. Neck:  Supple; no masses or thyromegaly. Lungs:  Clear throughout to auscultation.    Heart:  Regular rate and rhythm. Abdomen:  Soft, nontender and nondistended. Normal bowel sounds, without guarding, and without rebound.   Neurologic:  Alert and  oriented x4;  grossly normal neurologically.  Impression/Plan: Frank Wong is here for an endoscopy to be performed for dysphagia.  Risks, benefits, limitations, and alternatives regarding  endoscopy have been reviewed with the patient.  Questions have been answered.  All parties agreeable.   Gaylyn Cheers, MD  06/11/2017, 9:52 AM

## 2017-06-11 NOTE — Transfer of Care (Signed)
Immediate Anesthesia Transfer of Care Note  Patient: Frank Wong  Procedure(s) Performed: ESOPHAGOGASTRODUODENOSCOPY (EGD) WITH PROPOFOL (N/A ) BALLOON DILATION (N/A )  Patient Location: PACU  Anesthesia Type:General  Level of Consciousness: awake and sedated  Airway & Oxygen Therapy: Patient Spontanous Breathing and Patient connected to nasal cannula oxygen  Post-op Assessment: Report given to RN and Post -op Vital signs reviewed and stable  Post vital signs: Reviewed and stable  Last Vitals:  Vitals:   06/11/17 0921  BP: 130/87  Pulse: 72  Resp: 16  Temp: (!) 35.8 C  SpO2: 99%    Last Pain:  Vitals:   06/11/17 0921  TempSrc: Tympanic         Complications: No apparent anesthesia complications

## 2017-06-11 NOTE — Anesthesia Postprocedure Evaluation (Signed)
Anesthesia Post Note  Patient: Frank Wong  Procedure(s) Performed: ESOPHAGOGASTRODUODENOSCOPY (EGD) WITH PROPOFOL (N/A ) BALLOON DILATION (N/A )  Patient location during evaluation: Endoscopy Anesthesia Type: General Level of consciousness: awake and alert Pain management: pain level controlled Vital Signs Assessment: post-procedure vital signs reviewed and stable Respiratory status: spontaneous breathing and respiratory function stable Cardiovascular status: stable Anesthetic complications: no     Last Vitals:  Vitals:   06/11/17 1047 06/11/17 1057  BP: 129/72 (!) 139/91  Pulse: 65 62  Resp: 14 15  Temp:    SpO2: 96% 97%    Last Pain:  Vitals:   06/11/17 1057  TempSrc:   PainSc: 2                  KEPHART,WILLIAM K

## 2017-06-11 NOTE — Anesthesia Post-op Follow-up Note (Signed)
Anesthesia QCDR form completed.        

## 2017-06-11 NOTE — Anesthesia Procedure Notes (Signed)
Performed by: Cook-Martin, Adaleen Hulgan Pre-anesthesia Checklist: Patient identified, Emergency Drugs available, Suction available, Patient being monitored and Timeout performed Patient Re-evaluated:Patient Re-evaluated prior to induction Oxygen Delivery Method: Nasal cannula Preoxygenation: Pre-oxygenation with 100% oxygen Induction Type: IV induction Airway Equipment and Method: Bite block Placement Confirmation: CO2 detector and positive ETCO2       

## 2017-06-11 NOTE — Anesthesia Preprocedure Evaluation (Signed)
Anesthesia Evaluation  Patient identified by MRN, date of birth, ID band Patient awake    Reviewed: Allergy & Precautions, NPO status , Patient's Chart, lab work & pertinent test results  History of Anesthesia Complications (+) history of anesthetic complications (iv injection of local, sz and cardiovascular collapse)  Airway Mallampati: II       Dental   Pulmonary asthma , sleep apnea and Continuous Positive Airway Pressure Ventilation , former smoker,           Cardiovascular hypertension, Pt. on medications + CAD and + Past MI  (-) CHF (-) dysrhythmias (-) Valvular Problems/Murmurs     Neuro/Psych neg Seizures    GI/Hepatic Neg liver ROS, GERD  Medicated,  Endo/Other  diabetes, Type 2, Oral Hypoglycemic Agents  Renal/GU negative Renal ROS     Musculoskeletal   Abdominal   Peds  Hematology negative hematology ROS (+)   Anesthesia Other Findings   Reproductive/Obstetrics                             Anesthesia Physical Anesthesia Plan  ASA: III  Anesthesia Plan: General   Post-op Pain Management:    Induction: Intravenous  PONV Risk Score and Plan: 2 and TIVA, Propofol infusion and Treatment may vary due to age or medical condition  Airway Management Planned: Nasal Cannula  Additional Equipment:   Intra-op Plan:   Post-operative Plan:   Informed Consent: I have reviewed the patients History and Physical, chart, labs and discussed the procedure including the risks, benefits and alternatives for the proposed anesthesia with the patient or authorized representative who has indicated his/her understanding and acceptance.     Plan Discussed with:   Anesthesia Plan Comments:         Anesthesia Quick Evaluation

## 2017-06-11 NOTE — Op Note (Signed)
[facility] Patient Name: Frank Wong Procedure Date: 06/11/2017 9:55 AM MRN: [id number] Account #: [id number] Date of Birth: [birth year] Admit Type: Outpatient Age: 62 Room: [facility] ENDO ROOM 3 Gender: Male Note Status: Finalized Procedure:            Upper GI endoscopy Indications:          Dysphagia Providers:            Manya Silvas, MD Referring MD:         No Local Md, MD (Referring MD) Medicines:            Propofol per Anesthesia Complications:        No immediate complications. Procedure:            Pre-Anesthesia Assessment:                       - After reviewing the risks and benefits, the patient                        was deemed in satisfactory condition to undergo the                        procedure.                       After obtaining informed consent, the endoscope was                        passed under direct vision. Throughout the procedure,                        the patient's blood pressure, pulse, and oxygen                        saturations were monitored continuously. The Endoscope                        was introduced through the mouth, and advanced to the                        second part of duodenum. The upper GI endoscopy was                        accomplished without difficulty. The patient tolerated                        the procedure well. Findings:      The examined esophagus was normal. At the end of the procedure A       guidewire was placed and the scope was withdrawn. Dilation was performed       with a Savary dilator with minimal resistance at 17 mm.      Patchy mild inflammation characterized by erythema and granularity was       found in the gastric antrum. Biopsies were taken with a cold forceps for       histology. Biopsies were taken with a cold forceps for Helicobacter       pylori testing.      The examined duodenum was normal. Impression:           - Normal esophagus. Dilated.            -  Gastritis. Biopsied.                       - Normal examined duodenum. Recommendation:       - Await pathology results.                       - soft food for 3 days, eat slowly, chew well, take                        small bites Manya Silvas, MD 06/11/2017 10:15:36 AM This report has been signed electronically. Number of Addenda: 0 Note Initiated On: 06/11/2017 9:55 AM      [facility]

## 2017-06-12 ENCOUNTER — Encounter: Payer: Self-pay | Admitting: Unknown Physician Specialty

## 2017-06-12 LAB — SURGICAL PATHOLOGY

## 2017-06-14 DIAGNOSIS — L570 Actinic keratosis: Secondary | ICD-10-CM | POA: Diagnosis not present

## 2017-06-14 DIAGNOSIS — L501 Idiopathic urticaria: Secondary | ICD-10-CM | POA: Diagnosis not present

## 2017-06-14 DIAGNOSIS — L820 Inflamed seborrheic keratosis: Secondary | ICD-10-CM | POA: Diagnosis not present

## 2017-06-14 DIAGNOSIS — L509 Urticaria, unspecified: Secondary | ICD-10-CM | POA: Diagnosis not present

## 2017-06-14 DIAGNOSIS — L503 Dermatographic urticaria: Secondary | ICD-10-CM | POA: Diagnosis not present

## 2017-06-26 ENCOUNTER — Other Ambulatory Visit: Payer: Self-pay | Admitting: Nurse Practitioner

## 2017-06-26 DIAGNOSIS — I10 Essential (primary) hypertension: Secondary | ICD-10-CM | POA: Diagnosis not present

## 2017-06-26 DIAGNOSIS — K766 Portal hypertension: Secondary | ICD-10-CM | POA: Diagnosis not present

## 2017-06-26 DIAGNOSIS — R0789 Other chest pain: Secondary | ICD-10-CM | POA: Diagnosis not present

## 2017-06-26 DIAGNOSIS — I25118 Atherosclerotic heart disease of native coronary artery with other forms of angina pectoris: Secondary | ICD-10-CM | POA: Diagnosis not present

## 2017-06-26 DIAGNOSIS — K3189 Other diseases of stomach and duodenum: Secondary | ICD-10-CM | POA: Diagnosis not present

## 2017-06-28 ENCOUNTER — Ambulatory Visit
Admission: RE | Admit: 2017-06-28 | Discharge: 2017-06-28 | Disposition: A | Discharge status: Home / Self Care | Payer: 59 | Source: Ambulatory Visit | Attending: Nurse Practitioner | Admitting: Nurse Practitioner

## 2017-06-28 ENCOUNTER — Ambulatory Visit: Payer: 59

## 2017-06-28 DIAGNOSIS — N281 Cyst of kidney, acquired: Secondary | ICD-10-CM | POA: Insufficient documentation

## 2017-06-28 DIAGNOSIS — K766 Portal hypertension: Secondary | ICD-10-CM | POA: Insufficient documentation

## 2017-06-28 DIAGNOSIS — K3189 Other diseases of stomach and duodenum: Secondary | ICD-10-CM | POA: Insufficient documentation

## 2017-07-10 DIAGNOSIS — R0789 Other chest pain: Secondary | ICD-10-CM | POA: Diagnosis not present

## 2017-07-10 DIAGNOSIS — G250 Essential tremor: Secondary | ICD-10-CM | POA: Diagnosis not present

## 2017-07-10 DIAGNOSIS — R200 Anesthesia of skin: Secondary | ICD-10-CM | POA: Diagnosis not present

## 2017-07-16 DIAGNOSIS — I25118 Atherosclerotic heart disease of native coronary artery with other forms of angina pectoris: Secondary | ICD-10-CM | POA: Diagnosis not present

## 2017-07-16 DIAGNOSIS — I10 Essential (primary) hypertension: Secondary | ICD-10-CM | POA: Diagnosis not present

## 2017-07-16 DIAGNOSIS — E782 Mixed hyperlipidemia: Secondary | ICD-10-CM | POA: Diagnosis not present

## 2017-08-08 DIAGNOSIS — E291 Testicular hypofunction: Secondary | ICD-10-CM | POA: Diagnosis not present

## 2017-08-08 DIAGNOSIS — N401 Enlarged prostate with lower urinary tract symptoms: Secondary | ICD-10-CM | POA: Diagnosis not present

## 2017-08-08 DIAGNOSIS — Z79899 Other long term (current) drug therapy: Secondary | ICD-10-CM | POA: Diagnosis not present

## 2017-09-18 DIAGNOSIS — G4733 Obstructive sleep apnea (adult) (pediatric): Secondary | ICD-10-CM | POA: Diagnosis not present

## 2017-09-24 DIAGNOSIS — L509 Urticaria, unspecified: Secondary | ICD-10-CM | POA: Diagnosis not present

## 2017-10-01 DIAGNOSIS — L501 Idiopathic urticaria: Secondary | ICD-10-CM | POA: Diagnosis not present

## 2017-10-16 DIAGNOSIS — Z5181 Encounter for therapeutic drug level monitoring: Secondary | ICD-10-CM | POA: Diagnosis not present

## 2017-10-16 DIAGNOSIS — E119 Type 2 diabetes mellitus without complications: Secondary | ICD-10-CM | POA: Diagnosis not present

## 2017-10-16 DIAGNOSIS — E782 Mixed hyperlipidemia: Secondary | ICD-10-CM | POA: Diagnosis not present

## 2017-10-23 DIAGNOSIS — I25118 Atherosclerotic heart disease of native coronary artery with other forms of angina pectoris: Secondary | ICD-10-CM | POA: Diagnosis not present

## 2017-10-23 DIAGNOSIS — Z0001 Encounter for general adult medical examination with abnormal findings: Secondary | ICD-10-CM | POA: Diagnosis not present

## 2017-10-23 DIAGNOSIS — I10 Essential (primary) hypertension: Secondary | ICD-10-CM | POA: Diagnosis not present

## 2017-10-25 DIAGNOSIS — R200 Anesthesia of skin: Secondary | ICD-10-CM | POA: Diagnosis not present

## 2017-10-25 DIAGNOSIS — G250 Essential tremor: Secondary | ICD-10-CM | POA: Diagnosis not present

## 2017-12-12 DIAGNOSIS — G4733 Obstructive sleep apnea (adult) (pediatric): Secondary | ICD-10-CM | POA: Diagnosis not present

## 2017-12-13 DIAGNOSIS — L509 Urticaria, unspecified: Secondary | ICD-10-CM | POA: Diagnosis not present

## 2017-12-13 DIAGNOSIS — G4733 Obstructive sleep apnea (adult) (pediatric): Secondary | ICD-10-CM | POA: Diagnosis not present

## 2017-12-13 DIAGNOSIS — J45990 Exercise induced bronchospasm: Secondary | ICD-10-CM | POA: Diagnosis not present

## 2017-12-18 DIAGNOSIS — L509 Urticaria, unspecified: Secondary | ICD-10-CM | POA: Diagnosis not present

## 2017-12-31 DIAGNOSIS — R079 Chest pain, unspecified: Secondary | ICD-10-CM | POA: Diagnosis not present

## 2017-12-31 DIAGNOSIS — N183 Chronic kidney disease, stage 3 unspecified: Secondary | ICD-10-CM | POA: Insufficient documentation

## 2017-12-31 DIAGNOSIS — R0602 Shortness of breath: Secondary | ICD-10-CM | POA: Diagnosis not present

## 2017-12-31 DIAGNOSIS — I25118 Atherosclerotic heart disease of native coronary artery with other forms of angina pectoris: Secondary | ICD-10-CM | POA: Diagnosis not present

## 2018-01-03 DIAGNOSIS — J300 Vasomotor rhinitis: Secondary | ICD-10-CM | POA: Diagnosis not present

## 2018-01-03 DIAGNOSIS — H1045 Other chronic allergic conjunctivitis: Secondary | ICD-10-CM | POA: Diagnosis not present

## 2018-01-03 DIAGNOSIS — J4540 Moderate persistent asthma, uncomplicated: Secondary | ICD-10-CM | POA: Diagnosis not present

## 2018-01-03 DIAGNOSIS — R21 Rash and other nonspecific skin eruption: Secondary | ICD-10-CM | POA: Diagnosis not present

## 2018-01-08 DIAGNOSIS — H16223 Keratoconjunctivitis sicca, not specified as Sjogren's, bilateral: Secondary | ICD-10-CM | POA: Diagnosis not present

## 2018-01-08 DIAGNOSIS — H11003 Unspecified pterygium of eye, bilateral: Secondary | ICD-10-CM | POA: Diagnosis not present

## 2018-01-09 DIAGNOSIS — Z09 Encounter for follow-up examination after completed treatment for conditions other than malignant neoplasm: Secondary | ICD-10-CM | POA: Diagnosis not present

## 2018-01-09 DIAGNOSIS — Z01818 Encounter for other preprocedural examination: Secondary | ICD-10-CM | POA: Diagnosis not present

## 2018-01-09 DIAGNOSIS — I25118 Atherosclerotic heart disease of native coronary artery with other forms of angina pectoris: Secondary | ICD-10-CM | POA: Diagnosis not present

## 2018-01-09 DIAGNOSIS — R079 Chest pain, unspecified: Secondary | ICD-10-CM | POA: Diagnosis not present

## 2018-01-10 DIAGNOSIS — I208 Other forms of angina pectoris: Secondary | ICD-10-CM

## 2018-01-16 ENCOUNTER — Encounter
Admission: RE | Disposition: A | Discharge status: Home / Self Care | Payer: Self-pay | Source: Ambulatory Visit | Attending: Internal Medicine

## 2018-01-16 ENCOUNTER — Other Ambulatory Visit: Payer: Self-pay

## 2018-01-16 ENCOUNTER — Observation Stay
Admission: RE | Admit: 2018-01-16 | Discharge: 2018-01-17 | Disposition: A | Discharge status: Home / Self Care | Payer: 59 | Source: Ambulatory Visit | Attending: Internal Medicine | Admitting: Internal Medicine

## 2018-01-16 DIAGNOSIS — Z8379 Family history of other diseases of the digestive system: Secondary | ICD-10-CM | POA: Diagnosis not present

## 2018-01-16 DIAGNOSIS — K589 Irritable bowel syndrome without diarrhea: Secondary | ICD-10-CM | POA: Insufficient documentation

## 2018-01-16 DIAGNOSIS — Z8249 Family history of ischemic heart disease and other diseases of the circulatory system: Secondary | ICD-10-CM | POA: Insufficient documentation

## 2018-01-16 DIAGNOSIS — F329 Major depressive disorder, single episode, unspecified: Secondary | ICD-10-CM | POA: Diagnosis not present

## 2018-01-16 DIAGNOSIS — I208 Other forms of angina pectoris: Secondary | ICD-10-CM

## 2018-01-16 DIAGNOSIS — K219 Gastro-esophageal reflux disease without esophagitis: Secondary | ICD-10-CM | POA: Diagnosis not present

## 2018-01-16 DIAGNOSIS — M25512 Pain in left shoulder: Secondary | ICD-10-CM | POA: Insufficient documentation

## 2018-01-16 DIAGNOSIS — Z7984 Long term (current) use of oral hypoglycemic drugs: Secondary | ICD-10-CM | POA: Insufficient documentation

## 2018-01-16 DIAGNOSIS — Z7951 Long term (current) use of inhaled steroids: Secondary | ICD-10-CM | POA: Diagnosis not present

## 2018-01-16 DIAGNOSIS — Z809 Family history of malignant neoplasm, unspecified: Secondary | ICD-10-CM | POA: Insufficient documentation

## 2018-01-16 DIAGNOSIS — G4733 Obstructive sleep apnea (adult) (pediatric): Secondary | ICD-10-CM | POA: Diagnosis not present

## 2018-01-16 DIAGNOSIS — I25119 Atherosclerotic heart disease of native coronary artery with unspecified angina pectoris: Secondary | ICD-10-CM | POA: Diagnosis not present

## 2018-01-16 DIAGNOSIS — Z79899 Other long term (current) drug therapy: Secondary | ICD-10-CM | POA: Diagnosis not present

## 2018-01-16 DIAGNOSIS — Z825 Family history of asthma and other chronic lower respiratory diseases: Secondary | ICD-10-CM | POA: Insufficient documentation

## 2018-01-16 DIAGNOSIS — G8929 Other chronic pain: Secondary | ICD-10-CM | POA: Insufficient documentation

## 2018-01-16 DIAGNOSIS — Z8711 Personal history of peptic ulcer disease: Secondary | ICD-10-CM | POA: Insufficient documentation

## 2018-01-16 DIAGNOSIS — R079 Chest pain, unspecified: Secondary | ICD-10-CM

## 2018-01-16 DIAGNOSIS — G250 Essential tremor: Secondary | ICD-10-CM | POA: Diagnosis not present

## 2018-01-16 DIAGNOSIS — I200 Unstable angina: Secondary | ICD-10-CM | POA: Diagnosis not present

## 2018-01-16 DIAGNOSIS — N189 Chronic kidney disease, unspecified: Secondary | ICD-10-CM | POA: Insufficient documentation

## 2018-01-16 DIAGNOSIS — I2510 Atherosclerotic heart disease of native coronary artery without angina pectoris: Secondary | ICD-10-CM | POA: Diagnosis not present

## 2018-01-16 DIAGNOSIS — Z87891 Personal history of nicotine dependence: Secondary | ICD-10-CM | POA: Diagnosis not present

## 2018-01-16 DIAGNOSIS — I25790 Atherosclerosis of other coronary artery bypass graft(s) with unstable angina pectoris: Secondary | ICD-10-CM | POA: Diagnosis not present

## 2018-01-16 DIAGNOSIS — M1990 Unspecified osteoarthritis, unspecified site: Secondary | ICD-10-CM | POA: Insufficient documentation

## 2018-01-16 DIAGNOSIS — I1310 Hypertensive heart and chronic kidney disease without heart failure, with stage 1 through stage 4 chronic kidney disease, or unspecified chronic kidney disease: Secondary | ICD-10-CM | POA: Diagnosis not present

## 2018-01-16 DIAGNOSIS — Z955 Presence of coronary angioplasty implant and graft: Secondary | ICD-10-CM | POA: Diagnosis present

## 2018-01-16 DIAGNOSIS — E1122 Type 2 diabetes mellitus with diabetic chronic kidney disease: Secondary | ICD-10-CM | POA: Diagnosis not present

## 2018-01-16 DIAGNOSIS — J45990 Exercise induced bronchospasm: Secondary | ICD-10-CM | POA: Diagnosis not present

## 2018-01-16 DIAGNOSIS — Z9889 Other specified postprocedural states: Secondary | ICD-10-CM | POA: Diagnosis not present

## 2018-01-16 DIAGNOSIS — I252 Old myocardial infarction: Secondary | ICD-10-CM | POA: Diagnosis not present

## 2018-01-16 DIAGNOSIS — E785 Hyperlipidemia, unspecified: Secondary | ICD-10-CM | POA: Diagnosis not present

## 2018-01-16 HISTORY — PX: CORONARY STENT INTERVENTION: CATH118234

## 2018-01-16 HISTORY — PX: LEFT HEART CATH AND CORONARY ANGIOGRAPHY: CATH118249

## 2018-01-16 LAB — GLUCOSE, CAPILLARY
GLUCOSE-CAPILLARY: 100 mg/dL — AB (ref 70–99)
GLUCOSE-CAPILLARY: 111 mg/dL — AB (ref 70–99)
Glucose-Capillary: 150 mg/dL — ABNORMAL HIGH (ref 70–99)

## 2018-01-16 LAB — POCT ACTIVATED CLOTTING TIME: ACTIVATED CLOTTING TIME: 351 s

## 2018-01-16 LAB — HEPARIN LEVEL (UNFRACTIONATED)

## 2018-01-16 SURGERY — CORONARY STENT INTERVENTION
Anesthesia: Moderate Sedation

## 2018-01-16 SURGERY — LEFT HEART CATH AND CORONARY ANGIOGRAPHY
Anesthesia: Moderate Sedation | Laterality: Left

## 2018-01-16 MED ORDER — FENTANYL CITRATE (PF) 100 MCG/2ML IJ SOLN
INTRAMUSCULAR | Status: DC | PRN
  Administered 2018-01-16 (×2): 25 ug via INTRAVENOUS

## 2018-01-16 MED ORDER — MIDAZOLAM HCL 2 MG/2ML IJ SOLN
INTRAMUSCULAR | Status: AC
  Filled 2018-01-16: qty 2

## 2018-01-16 MED ORDER — SODIUM CHLORIDE 0.9 % IV SOLN
INTRAVENOUS | Status: AC | PRN
  Administered 2018-01-16: 243 mL via INTRAVENOUS

## 2018-01-16 MED ORDER — ASPIRIN 81 MG PO CHEW
CHEWABLE_TABLET | ORAL | Status: AC
  Filled 2018-01-16: qty 3

## 2018-01-16 MED ORDER — SODIUM CHLORIDE 0.9% FLUSH: 3.0000 mL | Freq: Two times a day (BID) | INTRAVENOUS | Status: DC

## 2018-01-16 MED ORDER — ACETAMINOPHEN 325 MG PO TABS
650.0000 mg | ORAL_TABLET | ORAL | Status: DC | PRN
  Administered 2018-01-16: 650 mg via ORAL
  Filled 2018-01-16: qty 2

## 2018-01-16 MED ORDER — HEPARIN (PORCINE) IN NACL 100-0.45 UNIT/ML-% IJ SOLN
INTRAMUSCULAR | Status: AC
  Administered 2018-01-16: 4000 [IU] via INTRAVENOUS
  Filled 2018-01-16: qty 250

## 2018-01-16 MED ORDER — SODIUM CHLORIDE 0.9 % IV SOLN: 250.0000 mL | INTRAVENOUS | Status: DC | PRN

## 2018-01-16 MED ORDER — MORPHINE SULFATE (PF) 2 MG/ML IV SOLN
2.0000 mg | Freq: Once | INTRAVENOUS | Status: AC
  Administered 2018-01-16: 2 mg via INTRAVENOUS

## 2018-01-16 MED ORDER — ATORVASTATIN CALCIUM 80 MG PO TABS
80.0000 mg | ORAL_TABLET | Freq: Every day | ORAL | Status: DC
  Filled 2018-01-16: qty 1
  Filled 2018-01-16: qty 4
  Filled 2018-01-16: qty 1

## 2018-01-16 MED ORDER — ASPIRIN 81 MG PO CHEW
81.0000 mg | CHEWABLE_TABLET | Freq: Every day | ORAL | Status: DC
  Administered 2018-01-17: 81 mg via ORAL
  Filled 2018-01-16: qty 1

## 2018-01-16 MED ORDER — LIDOCAINE HCL (PF) 1 % IJ SOLN
INTRAMUSCULAR | Status: AC
  Filled 2018-01-16: qty 30

## 2018-01-16 MED ORDER — SODIUM CHLORIDE 0.9 % WEIGHT BASED INFUSION
1.0000 mL/kg/h | INTRAVENOUS | Status: AC
  Administered 2018-01-16 (×2): 1 mL/kg/h via INTRAVENOUS

## 2018-01-16 MED ORDER — GLIPIZIDE 5 MG PO TABS
5.0000 mg | ORAL_TABLET | Freq: Every day | ORAL | Status: DC
  Administered 2018-01-17: 5 mg via ORAL
  Filled 2018-01-16: qty 1

## 2018-01-16 MED ORDER — CLONAZEPAM 0.5 MG PO TABS
0.5000 mg | ORAL_TABLET | Freq: Every day | ORAL | Status: DC | PRN
  Administered 2018-01-16: 0.5 mg via ORAL
  Filled 2018-01-16: qty 1

## 2018-01-16 MED ORDER — OXYCODONE HCL 5 MG PO TABS
5.0000 mg | ORAL_TABLET | ORAL | Status: DC | PRN
  Administered 2018-01-16 (×2): 5 mg via ORAL
  Filled 2018-01-16 (×2): qty 1

## 2018-01-16 MED ORDER — TICAGRELOR 90 MG PO TABS
ORAL_TABLET | ORAL | Status: DC | PRN
  Administered 2018-01-16: 180 mg via ORAL

## 2018-01-16 MED ORDER — MIDAZOLAM HCL 2 MG/2ML IJ SOLN
INTRAMUSCULAR | Status: DC | PRN
  Administered 2018-01-16: 1 mg via INTRAVENOUS

## 2018-01-16 MED ORDER — BENAZEPRIL HCL 10 MG PO TABS
10.0000 mg | ORAL_TABLET | Freq: Every day | ORAL | Status: DC
  Administered 2018-01-17: 10 mg via ORAL
  Filled 2018-01-16 (×2): qty 1

## 2018-01-16 MED ORDER — HEPARIN BOLUS VIA INFUSION
4000.0000 [IU] | Freq: Once | INTRAVENOUS | Status: AC
  Administered 2018-01-16: 4000 [IU] via INTRAVENOUS
  Filled 2018-01-16: qty 4000

## 2018-01-16 MED ORDER — ASPIRIN 81 MG PO CHEW: 81.0000 mg | CHEWABLE_TABLET | ORAL | Status: DC

## 2018-01-16 MED ORDER — ONDANSETRON HCL 4 MG/2ML IJ SOLN: 4.0000 mg | Freq: Four times a day (QID) | INTRAMUSCULAR | Status: DC | PRN

## 2018-01-16 MED ORDER — TICAGRELOR 90 MG PO TABS
ORAL_TABLET | ORAL | Status: AC
  Filled 2018-01-16: qty 2

## 2018-01-16 MED ORDER — NITROGLYCERIN 5 MG/ML IV SOLN
INTRAVENOUS | Status: AC
  Filled 2018-01-16: qty 10

## 2018-01-16 MED ORDER — HEPARIN (PORCINE) IN NACL 1000-0.9 UT/500ML-% IV SOLN
INTRAVENOUS | Status: AC
  Filled 2018-01-16: qty 1000

## 2018-01-16 MED ORDER — ASPIRIN 81 MG PO CHEW
CHEWABLE_TABLET | ORAL | Status: DC | PRN
  Administered 2018-01-16: 243 mg via ORAL

## 2018-01-16 MED ORDER — BIVALIRUDIN BOLUS VIA INFUSION - CUPID
INTRAVENOUS | Status: DC | PRN
  Administered 2018-01-16: 60.75 mg via INTRAVENOUS

## 2018-01-16 MED ORDER — PANTOPRAZOLE SODIUM 40 MG PO TBEC
40.0000 mg | DELAYED_RELEASE_TABLET | Freq: Every day | ORAL | Status: DC
  Administered 2018-01-17: 40 mg via ORAL
  Filled 2018-01-16: qty 1

## 2018-01-16 MED ORDER — MORPHINE SULFATE (PF) 2 MG/ML IV SOLN
INTRAVENOUS | Status: AC
  Filled 2018-01-16: qty 1

## 2018-01-16 MED ORDER — GABAPENTIN 600 MG PO TABS
600.0000 mg | ORAL_TABLET | Freq: Every day | ORAL | Status: DC
  Administered 2018-01-16 – 2018-01-17 (×2): 600 mg via ORAL
  Filled 2018-01-16 (×2): qty 1

## 2018-01-16 MED ORDER — SODIUM CHLORIDE 0.9 % IV SOLN
INTRAVENOUS | Status: AC | PRN
  Administered 2018-01-16: 250 mL/h via INTRAVENOUS

## 2018-01-16 MED ORDER — IOPAMIDOL (ISOVUE-300) INJECTION 61%
INTRAVENOUS | Status: DC | PRN
  Administered 2018-01-16: 150 mL via INTRA_ARTERIAL

## 2018-01-16 MED ORDER — SODIUM CHLORIDE 0.9% FLUSH: 3.0000 mL | INTRAVENOUS | Status: DC | PRN

## 2018-01-16 MED ORDER — MIDAZOLAM HCL 2 MG/2ML IJ SOLN
INTRAMUSCULAR | Status: DC | PRN
  Administered 2018-01-16 (×2): 1 mg via INTRAVENOUS

## 2018-01-16 MED ORDER — BIVALIRUDIN TRIFLUOROACETATE 250 MG IV SOLR
INTRAVENOUS | Status: AC
  Filled 2018-01-16: qty 250

## 2018-01-16 MED ORDER — IOPAMIDOL (ISOVUE-300) INJECTION 61%
INTRAVENOUS | Status: DC | PRN
  Administered 2018-01-16: 130 mL via INTRA_ARTERIAL

## 2018-01-16 MED ORDER — LABETALOL HCL 5 MG/ML IV SOLN: 10.0000 mg | INTRAVENOUS | Status: AC | PRN

## 2018-01-16 MED ORDER — FENTANYL CITRATE (PF) 100 MCG/2ML IJ SOLN
INTRAMUSCULAR | Status: AC
  Filled 2018-01-16: qty 2

## 2018-01-16 MED ORDER — TICAGRELOR 90 MG PO TABS
90.0000 mg | ORAL_TABLET | Freq: Two times a day (BID) | ORAL | Status: DC
  Administered 2018-01-16 – 2018-01-17 (×2): 90 mg via ORAL
  Filled 2018-01-16 (×2): qty 1

## 2018-01-16 MED ORDER — HYDRALAZINE HCL 20 MG/ML IJ SOLN: 5.0000 mg | INTRAMUSCULAR | Status: AC | PRN

## 2018-01-16 MED ORDER — SODIUM CHLORIDE 0.9 % WEIGHT BASED INFUSION: 1.0000 mL/kg/h | INTRAVENOUS | Status: DC

## 2018-01-16 MED ORDER — SODIUM CHLORIDE 0.9 % WEIGHT BASED INFUSION
3.0000 mL/kg/h | INTRAVENOUS | Status: AC
  Administered 2018-01-16: 3 mL/kg/h via INTRAVENOUS

## 2018-01-16 MED ORDER — SODIUM CHLORIDE 0.9 % IV SOLN
INTRAVENOUS | Status: AC | PRN
  Administered 2018-01-16: 1.75 mg/kg/h via INTRAVENOUS

## 2018-01-16 MED ORDER — HEPARIN (PORCINE) IN NACL 100-0.45 UNIT/ML-% IJ SOLN
950.0000 [IU]/h | INTRAMUSCULAR | Status: DC
  Administered 2018-01-16: 950 [IU]/h via INTRAVENOUS

## 2018-01-16 SURGICAL SUPPLY — 14 items
BALLN TREK RX 2.5X15 (BALLOONS) ×2
BALLN ~~LOC~~ TREK RX 2.75X12 (BALLOONS) ×2
BALLOON TREK RX 2.5X15 (BALLOONS) ×1 IMPLANT
BALLOON ~~LOC~~ TREK RX 2.75X12 (BALLOONS) ×1 IMPLANT
CATH VISTA GUIDE 6FR XB3.5 SH (CATHETERS) ×2 IMPLANT
DEVICE CLOSURE MYNXGRIP 6/7F (Vascular Products) ×2 IMPLANT
DEVICE INFLAT 30 PLUS (MISCELLANEOUS) ×2 IMPLANT
DEVICE SAFEGUARD 24CM (GAUZE/BANDAGES/DRESSINGS) ×2 IMPLANT
KIT MANI 3VAL PERCEP (MISCELLANEOUS) ×2 IMPLANT
PACK CARDIAC CATH (CUSTOM PROCEDURE TRAY) ×2 IMPLANT
SHEATH AVANTI 6FR X 11CM (SHEATH) ×2 IMPLANT
STENT SIERRA 2.50 X 18 MM (Permanent Stent) ×2 IMPLANT
WIRE G HI TQ BMW 190 (WIRE) ×2 IMPLANT
WIRE GUIDERIGHT .035X150 (WIRE) ×2 IMPLANT

## 2018-01-16 SURGICAL SUPPLY — 12 items
CATH INFINITI 5FR ANG PIGTAIL (CATHETERS) ×2 IMPLANT
CATH INFINITI 5FR JL4 (CATHETERS) ×2 IMPLANT
CATH INFINITI JR4 5F (CATHETERS) ×2 IMPLANT
CATH VISTA GUIDE 6FR XB3.5 SH (CATHETERS) IMPLANT
DEVICE INFLAT 30 PLUS (MISCELLANEOUS) IMPLANT
KIT MANI 3VAL PERCEP (MISCELLANEOUS) ×2 IMPLANT
NEEDLE PERC 18GX7CM (NEEDLE) ×2 IMPLANT
PACK CARDIAC CATH (CUSTOM PROCEDURE TRAY) ×2 IMPLANT
SHEATH AVANTI 5FR X 11CM (SHEATH) ×2 IMPLANT
SHEATH AVANTI 6FR X 11CM (SHEATH) ×2 IMPLANT
WIRE G HI TQ BMW 190 (WIRE) IMPLANT
WIRE GUIDERIGHT .035X150 (WIRE) ×2 IMPLANT

## 2018-01-16 NOTE — Progress Notes (Signed)
Cath lab collecting patient for PCI procedure

## 2018-01-16 NOTE — Progress Notes (Signed)
Pt requesting something more for pain, tylenol not helping, pain at groin site. Pt also requesting his klonopin that he takes at home, pt takes 0.5mg  daily as needed for anxiety. Dr. Clayborn Bigness paged, and he gave orders to give his home dose of klonopin, and also gave orders for oxycodone 1-2 tabs every 4hrs for pain. Will give and continue to monitor. Conley Simmonds, RN, BSN

## 2018-01-16 NOTE — Progress Notes (Signed)
ANTICOAGULATION CONSULT NOTE - Initial Consult  Pharmacy Consult for Heparin Indication: chest pain/ACS  Allergies  Allergen Reactions  . Penicillins Other (See Comments)    Has patient had a PCN reaction causing immediate rash, facial/tongue/throat swelling, SOB or lightheadedness with hypotension: No Has patient had a PCN reaction causing severe rash involving mucus membranes or skin necrosis: No Has patient had a PCN reaction that required hospitalization No Has patient had a PCN reaction occurring within the last 10 years: No If all of the above answers are "NO", then may proceed with Cephalosporin use.   . Esomeprazole Magnesium Rash    Patient Measurements: Height: 5\' 8"  (172.7 cm) Weight: 178 lb 9.2 oz (81 kg) IBW/kg (Calculated) : 68.4 Heparin Dosing Weight: 81 kg  Vital Signs: Temp: 98 F (36.7 C) (08/21 0742) Temp Source: Oral (08/21 0742) BP: 95/59 (08/21 1000) Pulse Rate: 49 (08/21 1000)  Labs: No results for input(s): HGB, HCT, PLT, APTT, LABPROT, INR, HEPARINUNFRC, HEPRLOWMOCWT, CREATININE, CKTOTAL, CKMB, TROPONINI in the last 72 hours.  CrCl cannot be calculated (Patient's most recent lab result is older than the maximum 21 days allowed.).   Medical History: Past Medical History:  Diagnosis Date  . Asthma   . CAD (coronary artery disease)   . Cardiac arrest ([place])   . Diabetes mellitus without complication ([place])   . GERD (gastroesophageal reflux disease)   . Heart attack ([place])   . Hypertension   . Shoulder problem 2010   right  . Sleep apnea     Medications:  Infusions:  . sodium chloride    . sodium chloride    . heparin 950 Units/hr (01/16/18 1018)    Assessment: 62 yo male awaiting cardiac intervention with Heparin consult ordered for bolus and drip  Goal of Therapy:  Heparin level 0.3-0.7 units/ml Monitor platelets by anticoagulation protocol: Yes   Plan:  Give 4000 units bolus x 1 Start heparin infusion at 950 units/hr Check  anti-Xa level in 6 hours and daily while on heparin Continue to monitor H&H and platelets  Prudy Feeler, RPh 01/16/2018,10:18 AM

## 2018-01-17 ENCOUNTER — Encounter: Payer: Self-pay | Admitting: *Deleted

## 2018-01-17 DIAGNOSIS — I25790 Atherosclerosis of other coronary artery bypass graft(s) with unstable angina pectoris: Secondary | ICD-10-CM | POA: Diagnosis not present

## 2018-01-17 DIAGNOSIS — I25119 Atherosclerotic heart disease of native coronary artery with unspecified angina pectoris: Secondary | ICD-10-CM | POA: Diagnosis not present

## 2018-01-17 MED ORDER — TICAGRELOR 90 MG PO TABS: 90.0000 mg | ORAL_TABLET | Freq: Two times a day (BID) | ORAL | 12 refills | Status: DC

## 2018-01-17 NOTE — Progress Notes (Signed)
Pt discharged to home via wc.  Instructions  given to pt.  Questions answered.  No distress.  

## 2018-01-17 NOTE — Plan of Care (Signed)
  Problem: Activity: Goal: Risk for activity intolerance will decrease Outcome: Progressing   Problem: Pain Managment: Goal: General experience of comfort will improve Outcome: Progressing Note:  Pt had some pain at the right groin site, oxycodone given with relief   Problem: Safety: Goal: Ability to remain free from injury will improve Outcome: Progressing   Problem: Education: Goal: Knowledge of General Education information will improve Description Including pain rating scale, medication(s)/side effects and non-pharmacologic comfort measures Outcome: Completed/Met   Problem: Clinical Measurements: Goal: Ability to maintain clinical measurements within normal limits will improve Outcome: Completed/Met Goal: Will remain free from infection Outcome: Completed/Met Goal: Diagnostic test results will improve Outcome: Completed/Met Goal: Respiratory complications will improve Outcome: Completed/Met Note:  Pt wears home CPAP at night   Problem: Cardiovascular: Goal: Vascular access site(s) Level 0-1 will be maintained Outcome: Completed/Met Note:  Site clean dry & intact, level 0

## 2018-01-17 NOTE — Discharge Summary (Signed)
[facility] Discharge Summary  Patient ID: Frank Wong MRN: [id number] DOB/AGE: 11/23/1955 62 y.o.  Admit date: 01/16/2018 Discharge date: 01/17/2018  Primary Discharge Diagnosis: Ischemic chest pain I20.9 and Coronary artery disease with angina I25.119 Secondary Discharge Diagnosis diabetes, high blood pressure and high cholesterol  Significant Diagnostic Studies: Cardiac cath with left ventricular angiogram and selective coronary injection as well as PCI and stent placement of left anterior descending.  Hospital Course: The patient was admitted to specials for cardiac cath with selective coronary angiogram after full consent, risk and benefits explained, and time out called with all approprate details voiced and discussed. The patient has had progressive canadian class 3 angina with high probability risk stress test consistent with ischemic chest pain and or anginal equivalent with coronary artery risk factors including diabetes, high blood pressure and high cholesterol. The procedure was performed without complication and it revealed normal left ventricular function with ejection fraction of 55%.  It was found that the patient had severe 1 vessel coronary atherosclerosis with significant left anterior descending stenosis requiring further intervention. Therefore, the patient had a PCI and drug eluding stent placed without complication. The patient has been ambulating without further significant symptoms and has reached his maximal hospital benefit and will be discharged to home in good condition.  Cardiac rehabilitation has been discussed and recommended. Medication management of cardiovascular risk factors will be given post discharge and modified as an outpatient.   Discharge Exam: Blood pressure 127/80, pulse 66, temperature 98.7 F (37.1 C), temperature source Oral, resp. rate 15, height 5\' 8"  (1.727 m), weight 81 kg, SpO2 98 %.  Constitutional: [place] oriented to person,  place, and time. No distress.  HENT: No nasal discharge.  Head: Normocephalic and atraumatic.  Eyes: Pupils are equal and round. No discharge.  Neck: Normal range of motion. Neck supple. No JVD present. No thyromegaly present.  Cardiovascular: Normal rate, regular rhythm, normal S1 S2, no gallop, no friction rub. No murmur Pulmonary/Chest: Effort normal, No stridor. No respiratory distress. no wheezes.  no rales.    Abdominal: Soft. Bowel sounds are normal.  no distension.  no tenderness. There is no rebound and no guarding.  Musculoskeletal: No edema, no cyanosis, normal pulses, no bleeding, Normal range of motion. no tenderness.  Neurological:  alert and oriented to person, place, and time. Coordination normal.  Skin: Skin is warm and dry. No rash noted. No erythema. No pallor.  Psychiatric:  normal mood and affect. behavior is normal.    Labs:   Lab Results  Component Value Date   WBC 8.7 01/28/2016   HGB 13.4 01/28/2016   HCT 37.2 (L) 01/28/2016   MCV 86.9 01/28/2016   PLT 138 (L) 01/28/2016   No results for input(s): NA, K, CL, CO2, BUN, CREATININE, CALCIUM, PROT, BILITOT, ALKPHOS, ALT, AST, GLUCOSE in the last 168 hours.  Invalid input(s): LABALBU  EKG: NSR without evidence of new changes  FOLLOW UP IN ONE TO TWO WEEKS Discharge Instructions    AMB Referral to Cardiac Rehabilitation - Phase II   Complete by:  As directed    Diagnosis:  Coronary Stents     Allergies as of 01/17/2018      Reactions   Penicillins Other (See Comments)   Has patient had a PCN reaction causing immediate rash, facial/tongue/throat swelling, SOB or lightheadedness with hypotension: No Has patient had a PCN reaction causing severe rash involving mucus membranes or skin necrosis: No Has patient had a PCN reaction that  required hospitalization No Has patient had a PCN reaction occurring within the last 10 years: No If all of the above answers are "NO", then may proceed with Cephalosporin use.    Esomeprazole Magnesium Rash      Medication List    STOP taking these medications   isosorbide mononitrate 30 MG 24 hr tablet Commonly known as:  IMDUR   ondansetron 4 MG disintegrating tablet Commonly known as:  ZOFRAN-ODT     TAKE these medications   albuterol 108 (90 Base) MCG/ACT inhaler Commonly known as:  PROVENTIL HFA;VENTOLIN HFA Inhale 1 puff into the lungs daily.   aspirin EC 81 MG tablet Take 81 mg by mouth daily.   atorvastatin 40 MG tablet Commonly known as:  LIPITOR Take 40 mg by mouth daily.   benazepril 20 MG tablet Commonly known as:  LOTENSIN Take 20 mg by mouth daily.   dexlansoprazole 60 MG capsule Commonly known as:  DEXILANT Take 60 mg by mouth daily.   EPINEPHrine 0.3 mg/0.3 mL Soaj injection Commonly known as:  EPI-PEN Inject 0.3 mg into the muscle as needed (anaphylaxis).   fexofenadine 180 MG tablet Commonly known as:  ALLEGRA Take 180 mg by mouth daily.   gabapentin 600 MG tablet Commonly known as:  NEURONTIN Take 600 mg by mouth daily.   glipiZIDE 5 MG tablet Commonly known as:  GLUCOTROL Take 5 mg by mouth daily before breakfast.   KLONOPIN 1 MG tablet Generic drug:  clonazePAM Take 0.5 mg by mouth daily as needed for anxiety.   levocetirizine 5 MG tablet Commonly known as:  XYZAL Take 5 mg by mouth daily.   magnesium oxide 400 MG tablet Commonly known as:  MAG-OX Take 400 mg by mouth daily.   metFORMIN 1000 MG tablet Commonly known as:  GLUCOPHAGE Take 1,000 mg by mouth 2 (two) times daily with a meal.   montelukast 10 MG tablet Commonly known as:  SINGULAIR Take 10 mg by mouth at bedtime.   multivitamin with minerals Tabs tablet Take 0.5 tablets by mouth daily.   nitroGLYCERIN 0.4 MG SL tablet Commonly known as:  NITROSTAT Place 0.4 mg under the tongue every 5 (five) minutes as needed for chest pain.   testosterone 50 MG/5GM (1%) Gel Commonly known as:  ANDROGEL Place 10 g onto the skin daily.   ticagrelor  90 MG Tabs tablet Commonly known as:  BRILINTA Take 1 tablet (90 mg total) by mouth 2 (two) times daily.   triamcinolone cream 0.1 % Commonly known as:  KENALOG Apply 1 application topically daily as needed for itching.   umeclidinium-vilanterol 62.5-25 MCG/INH Aepb Commonly known as:  ANORO ELLIPTA Inhale 1 puff into the lungs daily.      Follow-up Information    Corey Skains, MD Follow up in 1 week(s).   Specialty:  Cardiology Contact information: [address] [facility] [place] [address] [phone]           THE PATIENT  SHALL BRING ALL MEDICATIONS TO FOLLOW UP APPOINTMENT  Signed:  Corey Skains MD, [facility] 01/17/2018, 8:12 AM

## 2018-01-17 NOTE — Care Management Note (Signed)
Case Management Note  Patient Details  Name: Frank Wong MRN: [id number] Date of Birth: [birth year]  Subjective/Objective:   Brilinta voucher given                 Action/Plan:   Expected Discharge Date:  01/17/18               Expected Discharge Plan:     In-House Referral:     Discharge planning Services  CM Consult, Medication Assistance  Post Acute Care Choice:    Choice offered to:     DME Arranged:    DME Agency:     HH Arranged:    [place] Agency:     Status of Service:  Completed, signed off  If discussed at [employer] of Stay Meetings, dates discussed:    Additional Comments:  [name] [name], RN 01/17/2018, 10:09 AM

## 2018-01-18 DIAGNOSIS — E119 Type 2 diabetes mellitus without complications: Secondary | ICD-10-CM | POA: Diagnosis not present

## 2018-01-18 DIAGNOSIS — Z09 Encounter for follow-up examination after completed treatment for conditions other than malignant neoplasm: Secondary | ICD-10-CM | POA: Diagnosis not present

## 2018-01-23 DIAGNOSIS — I25118 Atherosclerotic heart disease of native coronary artery with other forms of angina pectoris: Secondary | ICD-10-CM | POA: Diagnosis not present

## 2018-01-23 DIAGNOSIS — E782 Mixed hyperlipidemia: Secondary | ICD-10-CM | POA: Diagnosis not present

## 2018-01-23 DIAGNOSIS — I10 Essential (primary) hypertension: Secondary | ICD-10-CM | POA: Diagnosis not present

## 2018-01-24 DIAGNOSIS — I25118 Atherosclerotic heart disease of native coronary artery with other forms of angina pectoris: Secondary | ICD-10-CM | POA: Diagnosis not present

## 2018-01-24 DIAGNOSIS — I10 Essential (primary) hypertension: Secondary | ICD-10-CM | POA: Diagnosis not present

## 2018-01-24 DIAGNOSIS — G4733 Obstructive sleep apnea (adult) (pediatric): Secondary | ICD-10-CM | POA: Diagnosis not present

## 2018-01-30 DIAGNOSIS — L501 Idiopathic urticaria: Secondary | ICD-10-CM | POA: Diagnosis not present

## 2018-02-06 DIAGNOSIS — Z79899 Other long term (current) drug therapy: Secondary | ICD-10-CM | POA: Diagnosis not present

## 2018-02-06 DIAGNOSIS — N401 Enlarged prostate with lower urinary tract symptoms: Secondary | ICD-10-CM | POA: Diagnosis not present

## 2018-02-06 DIAGNOSIS — E291 Testicular hypofunction: Secondary | ICD-10-CM | POA: Diagnosis not present

## 2018-02-06 DIAGNOSIS — Z125 Encounter for screening for malignant neoplasm of prostate: Secondary | ICD-10-CM | POA: Diagnosis not present

## 2018-02-08 DIAGNOSIS — L501 Idiopathic urticaria: Secondary | ICD-10-CM | POA: Diagnosis not present

## 2018-02-11 DIAGNOSIS — E782 Mixed hyperlipidemia: Secondary | ICD-10-CM | POA: Diagnosis not present

## 2018-02-11 DIAGNOSIS — I25118 Atherosclerotic heart disease of native coronary artery with other forms of angina pectoris: Secondary | ICD-10-CM | POA: Diagnosis not present

## 2018-02-11 DIAGNOSIS — I10 Essential (primary) hypertension: Secondary | ICD-10-CM | POA: Diagnosis not present

## 2018-02-11 DIAGNOSIS — I2510 Atherosclerotic heart disease of native coronary artery without angina pectoris: Secondary | ICD-10-CM | POA: Diagnosis not present

## 2018-02-21 DIAGNOSIS — L501 Idiopathic urticaria: Secondary | ICD-10-CM | POA: Diagnosis not present

## 2018-03-07 DIAGNOSIS — L501 Idiopathic urticaria: Secondary | ICD-10-CM | POA: Diagnosis not present

## 2018-03-14 DIAGNOSIS — J329 Chronic sinusitis, unspecified: Secondary | ICD-10-CM | POA: Diagnosis not present

## 2018-03-21 DIAGNOSIS — G4733 Obstructive sleep apnea (adult) (pediatric): Secondary | ICD-10-CM | POA: Diagnosis not present

## 2018-03-23 DIAGNOSIS — L501 Idiopathic urticaria: Secondary | ICD-10-CM | POA: Diagnosis not present

## 2018-04-04 DIAGNOSIS — L501 Idiopathic urticaria: Secondary | ICD-10-CM | POA: Diagnosis not present

## 2018-04-22 DIAGNOSIS — G4733 Obstructive sleep apnea (adult) (pediatric): Secondary | ICD-10-CM | POA: Diagnosis not present

## 2018-05-02 DIAGNOSIS — E119 Type 2 diabetes mellitus without complications: Secondary | ICD-10-CM | POA: Diagnosis not present

## 2018-05-08 DIAGNOSIS — I2510 Atherosclerotic heart disease of native coronary artery without angina pectoris: Secondary | ICD-10-CM | POA: Diagnosis not present

## 2018-05-08 DIAGNOSIS — E119 Type 2 diabetes mellitus without complications: Secondary | ICD-10-CM | POA: Diagnosis not present

## 2018-05-08 DIAGNOSIS — I10 Essential (primary) hypertension: Secondary | ICD-10-CM | POA: Diagnosis not present

## 2018-05-14 DIAGNOSIS — I2510 Atherosclerotic heart disease of native coronary artery without angina pectoris: Secondary | ICD-10-CM | POA: Diagnosis not present

## 2018-05-14 DIAGNOSIS — G4733 Obstructive sleep apnea (adult) (pediatric): Secondary | ICD-10-CM | POA: Diagnosis not present

## 2018-05-14 DIAGNOSIS — I10 Essential (primary) hypertension: Secondary | ICD-10-CM | POA: Diagnosis not present

## 2018-05-15 DIAGNOSIS — R200 Anesthesia of skin: Secondary | ICD-10-CM | POA: Diagnosis not present

## 2018-05-15 DIAGNOSIS — G250 Essential tremor: Secondary | ICD-10-CM | POA: Diagnosis not present

## 2018-05-16 DIAGNOSIS — L501 Idiopathic urticaria: Secondary | ICD-10-CM | POA: Diagnosis not present

## 2018-05-17 ENCOUNTER — Other Ambulatory Visit (HOSPITAL_COMMUNITY): Payer: Self-pay | Admitting: Neurology

## 2018-05-17 ENCOUNTER — Other Ambulatory Visit: Payer: Self-pay | Admitting: Neurology

## 2018-05-17 DIAGNOSIS — G8929 Other chronic pain: Secondary | ICD-10-CM

## 2018-05-17 DIAGNOSIS — R51 Headache: Principal | ICD-10-CM

## 2018-05-17 DIAGNOSIS — R519 Headache, unspecified: Secondary | ICD-10-CM

## 2018-05-20 DIAGNOSIS — L501 Idiopathic urticaria: Secondary | ICD-10-CM | POA: Diagnosis not present

## 2018-05-31 ENCOUNTER — Ambulatory Visit (HOSPITAL_COMMUNITY)
Admission: RE | Admit: 2018-05-31 | Discharge: 2018-05-31 | Disposition: A | Discharge status: Home / Self Care | Payer: 59 | Source: Ambulatory Visit | Attending: Neurology | Admitting: Neurology

## 2018-05-31 ENCOUNTER — Ambulatory Visit (HOSPITAL_COMMUNITY): Payer: 59

## 2018-05-31 ENCOUNTER — Encounter (HOSPITAL_COMMUNITY): Payer: Self-pay

## 2018-05-31 DIAGNOSIS — G8929 Other chronic pain: Secondary | ICD-10-CM

## 2018-05-31 DIAGNOSIS — R519 Headache, unspecified: Secondary | ICD-10-CM

## 2018-05-31 DIAGNOSIS — R51 Headache: Secondary | ICD-10-CM | POA: Diagnosis not present

## 2018-06-18 DIAGNOSIS — L821 Other seborrheic keratosis: Secondary | ICD-10-CM | POA: Diagnosis not present

## 2018-06-18 DIAGNOSIS — L578 Other skin changes due to chronic exposure to nonionizing radiation: Secondary | ICD-10-CM | POA: Diagnosis not present

## 2018-06-18 DIAGNOSIS — R21 Rash and other nonspecific skin eruption: Secondary | ICD-10-CM | POA: Diagnosis not present

## 2018-06-19 DIAGNOSIS — J449 Chronic obstructive pulmonary disease, unspecified: Secondary | ICD-10-CM | POA: Diagnosis not present

## 2018-06-19 DIAGNOSIS — R0609 Other forms of dyspnea: Secondary | ICD-10-CM | POA: Diagnosis not present

## 2018-06-23 DIAGNOSIS — L501 Idiopathic urticaria: Secondary | ICD-10-CM | POA: Diagnosis not present

## 2018-06-24 DIAGNOSIS — R200 Anesthesia of skin: Secondary | ICD-10-CM | POA: Diagnosis not present

## 2018-06-27 DIAGNOSIS — L501 Idiopathic urticaria: Secondary | ICD-10-CM | POA: Diagnosis not present

## 2018-07-22 DIAGNOSIS — G4733 Obstructive sleep apnea (adult) (pediatric): Secondary | ICD-10-CM | POA: Diagnosis not present

## 2018-08-07 DIAGNOSIS — Z79899 Other long term (current) drug therapy: Secondary | ICD-10-CM | POA: Diagnosis not present

## 2018-08-07 DIAGNOSIS — E291 Testicular hypofunction: Secondary | ICD-10-CM | POA: Diagnosis not present

## 2018-08-07 DIAGNOSIS — N401 Enlarged prostate with lower urinary tract symptoms: Secondary | ICD-10-CM | POA: Diagnosis not present

## 2018-08-09 DIAGNOSIS — E119 Type 2 diabetes mellitus without complications: Secondary | ICD-10-CM | POA: Diagnosis not present

## 2018-08-09 DIAGNOSIS — L03211 Cellulitis of face: Secondary | ICD-10-CM | POA: Diagnosis not present

## 2018-08-23 DIAGNOSIS — R0602 Shortness of breath: Secondary | ICD-10-CM | POA: Diagnosis not present

## 2018-08-23 DIAGNOSIS — R6883 Chills (without fever): Secondary | ICD-10-CM | POA: Diagnosis not present

## 2018-08-23 DIAGNOSIS — R05 Cough: Secondary | ICD-10-CM | POA: Diagnosis not present

## 2018-09-02 DIAGNOSIS — E119 Type 2 diabetes mellitus without complications: Secondary | ICD-10-CM | POA: Diagnosis not present

## 2018-09-09 DIAGNOSIS — G4733 Obstructive sleep apnea (adult) (pediatric): Secondary | ICD-10-CM | POA: Diagnosis not present

## 2018-09-09 DIAGNOSIS — I10 Essential (primary) hypertension: Secondary | ICD-10-CM | POA: Diagnosis not present

## 2018-09-09 DIAGNOSIS — I2510 Atherosclerotic heart disease of native coronary artery without angina pectoris: Secondary | ICD-10-CM | POA: Diagnosis not present

## 2019-07-03 ENCOUNTER — Other Ambulatory Visit: Payer: Self-pay | Admitting: Gastroenterology

## 2019-07-03 DIAGNOSIS — K760 Fatty (change of) liver, not elsewhere classified: Secondary | ICD-10-CM

## 2019-07-10 ENCOUNTER — Other Ambulatory Visit: Payer: Self-pay

## 2019-07-10 ENCOUNTER — Ambulatory Visit
Admission: RE | Admit: 2019-07-10 | Discharge: 2019-07-10 | Disposition: A | Discharge status: Home / Self Care | Payer: 59 | Source: Ambulatory Visit | Attending: Gastroenterology | Admitting: Gastroenterology

## 2019-07-10 DIAGNOSIS — K760 Fatty (change of) liver, not elsewhere classified: Secondary | ICD-10-CM

## 2019-08-03 ENCOUNTER — Ambulatory Visit: Payer: 59 | Attending: Internal Medicine

## 2019-08-03 DIAGNOSIS — Z23 Encounter for immunization: Secondary | ICD-10-CM

## 2019-08-03 NOTE — Progress Notes (Signed)
   Covid-19 Vaccination Clinic  Name:  Frank Wong    MRN: [id number] DOB: [birth year]  08/03/2019  [name] was observed post Covid-19 immunization for 15 minutes without incident. He was provided with Vaccine Information Sheet and instruction to access the V-Safe system.   [name] was instructed to call 911 with any severe reactions post vaccine: Marland Kitchen Difficulty breathing  . Swelling of face and throat  . A fast heartbeat  . A bad rash all over body  . Dizziness and weakness   Immunizations Administered    Name Date Dose VIS Date Route   Pfizer COVID-19 Vaccine 08/03/2019 12:35 PM 0.3 mL 05/09/2019 Intramuscular   Manufacturer: [place]   Lot: [id number]   [place]: [id number]

## 2019-08-27 ENCOUNTER — Ambulatory Visit: Payer: 59 | Attending: Internal Medicine

## 2019-08-27 ENCOUNTER — Other Ambulatory Visit: Payer: Self-pay

## 2019-08-27 DIAGNOSIS — Z23 Encounter for immunization: Secondary | ICD-10-CM

## 2019-08-27 NOTE — Progress Notes (Signed)
   Covid-19 Vaccination Clinic  Name:  JAURON FREERKSEN    MRN: [id number] DOB: [birth year]  08/27/2019  Mr. Widrick was observed post Covid-19 immunization for 15 minutes without incident. He was provided with Vaccine Information Sheet and instruction to access the V-Safe system.   Mr. Connally was instructed to call 911 with any severe reactions post vaccine: Marland Kitchen Difficulty breathing  . Swelling of face and throat  . A fast heartbeat  . A bad rash all over body  . Dizziness and weakness   Immunizations Administered    Name Date Dose VIS Date Route   Pfizer COVID-19 Vaccine 08/27/2019  8:21 AM 0.3 mL 05/09/2019 Intramuscular   Manufacturer: [place]   Lot: [id number]   [place]: [id number]

## 2019-10-09 IMAGING — MR MR MRV HEAD W/O CM
10 of 12 series · 38 of 48 positions shown · non-contrast
Comparison: MRI brain study same day.

CLINICAL DATA: Chronic headaches, worsening recently.

EXAM:
MR VENOGRAM the HEAD WITHOUT CONTRAST
TECHNIQUE: Angiographic images of the intracranial venous structures were
obtained using MRV technique without intravenous contrast.

[Series 3: DWI · axial · 3.0mm · 0.94mm/px · z∈[-2,+145]mm · 7 of 100 slices shown (1 of 2)]
[im 1/100]
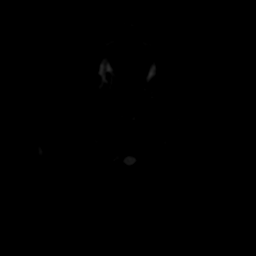
[im 17/100]
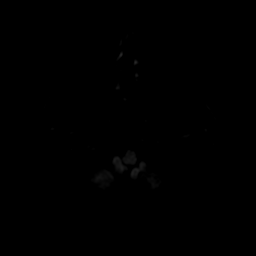
[im 34/100]
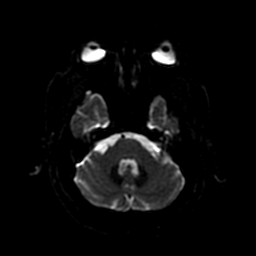
[im 50/100]
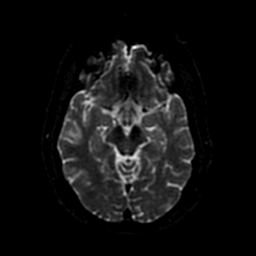
[im 67/100]
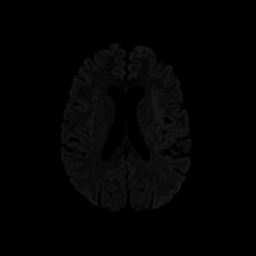
[im 83/100]
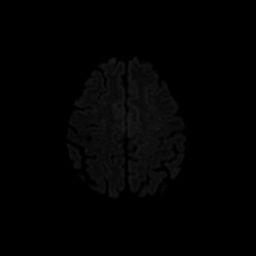
[im 100/100]
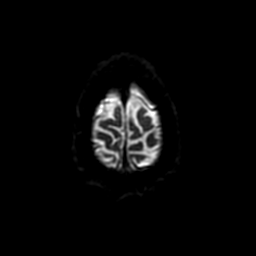

[Series 4: DWI · coronal · 4.0mm · 0.94mm/px · 5 of 72 slices shown (2 of 2)]
[im 1/72]
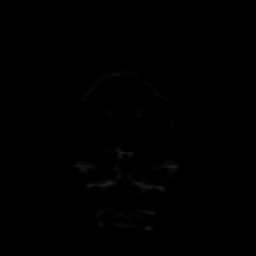
[im 18/72]
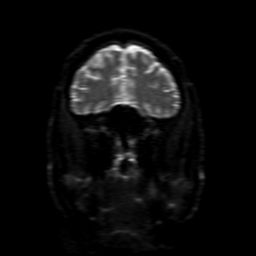
[im 36/72]
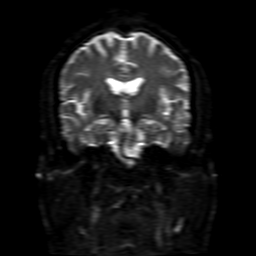
[im 54/72]
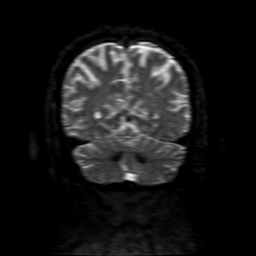
[im 72/72]
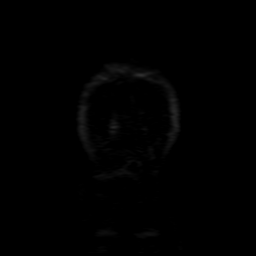

[Series 5: T1 · sagittal · 1.2mm · 0.94mm/px · 8 of 160 slices shown]
[im 1/160]
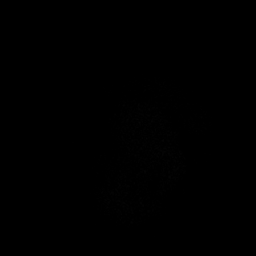
[im 20/160]
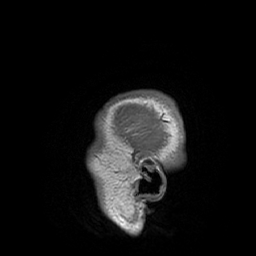
[im 40/160]
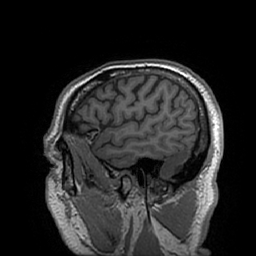
[im 60/160]
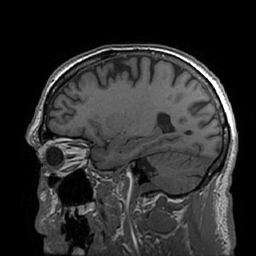
[im 100/160]
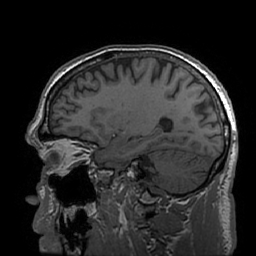
[im 120/160]
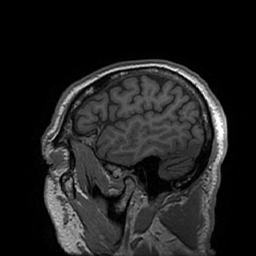
[im 140/160]
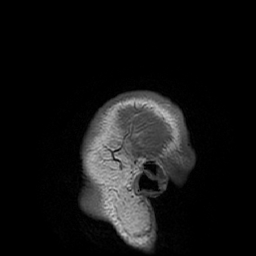
[im 160/160]
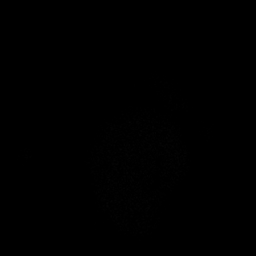

[Series 6: FLAIR · sagittal · 5.0mm · 0.47mm/px · 1 of 23 slices shown (1 of 2)]
[im 1/23]
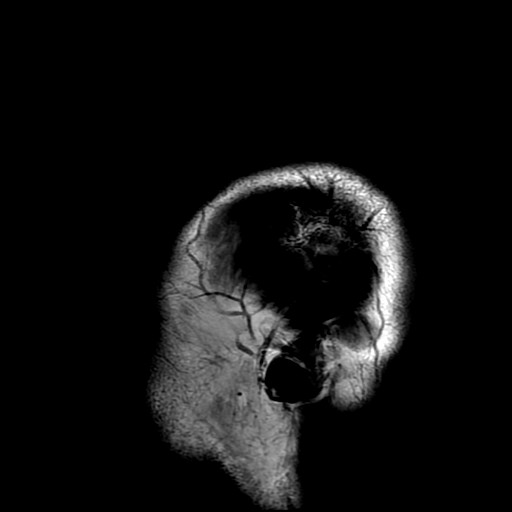

[Series 7: T2 · axial · 5.0mm · 0.47mm/px · 1 of 25 slices shown (1 of 2)]
[im 1/25]
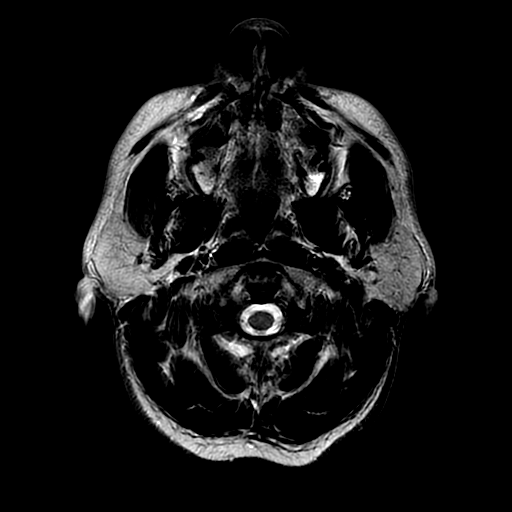

[Series 8: FLAIR · axial · 5.0mm · 0.47mm/px · 1 of 25 slices shown (2 of 2)]
[im 1/25]
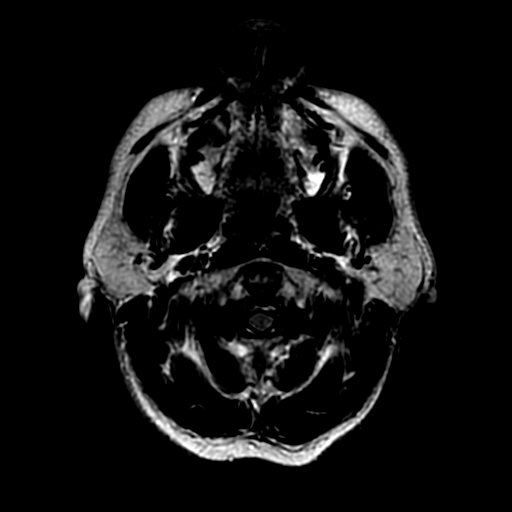

[Series 9: MRV · coronal · 1.5mm · 0.43mm/px · 8 of 143 slices shown]
[im 1/143]
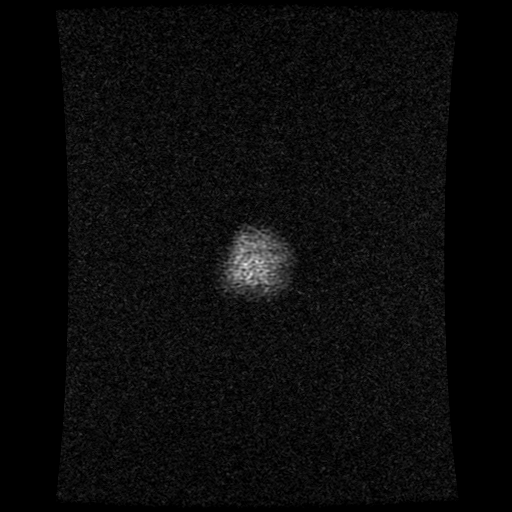
[im 21/143]
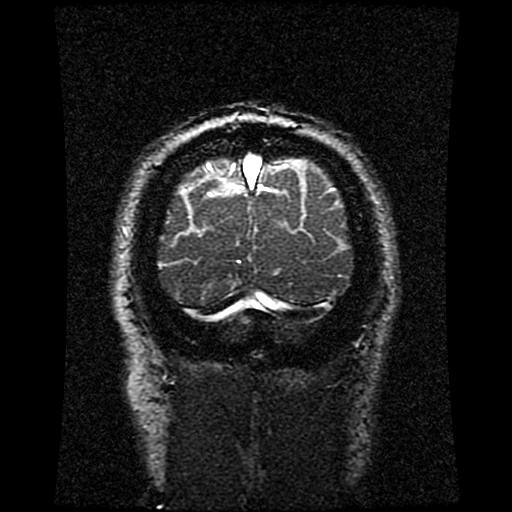
[im 41/143]
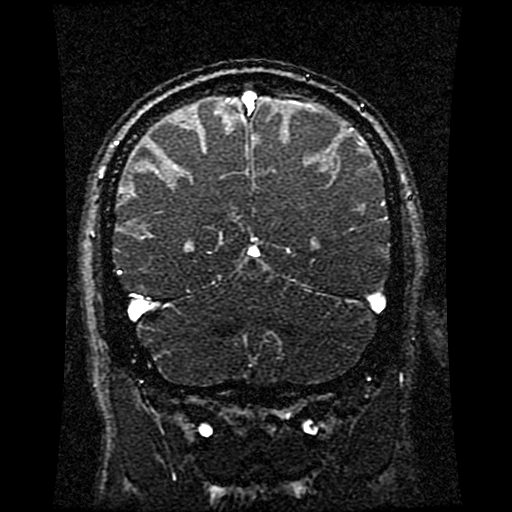
[im 61/143]
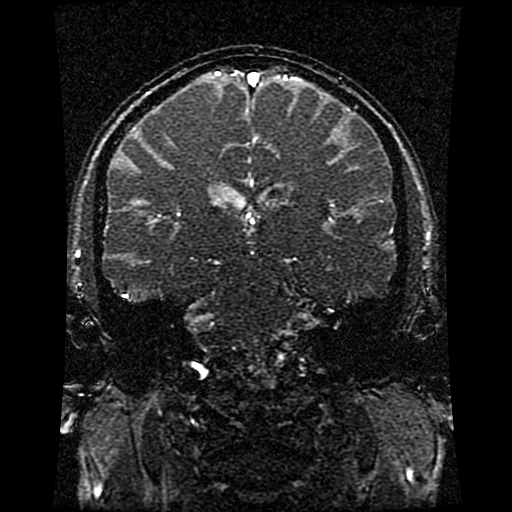
[im 82/143]
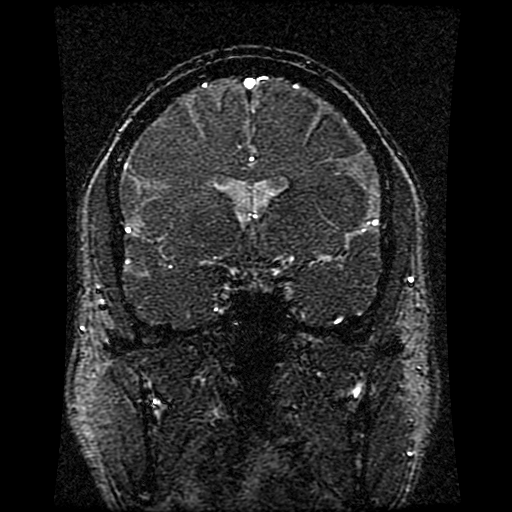
[im 102/143]
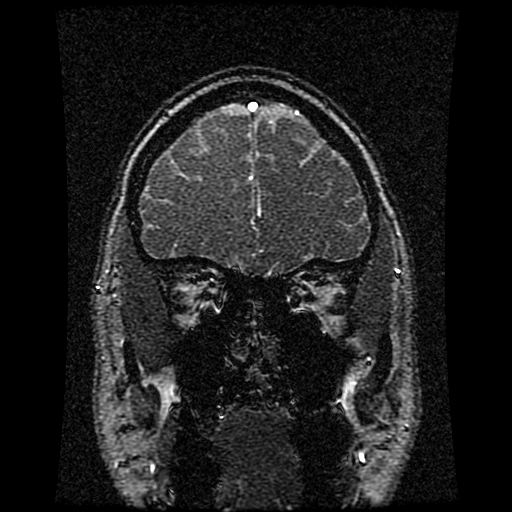
[im 122/143]
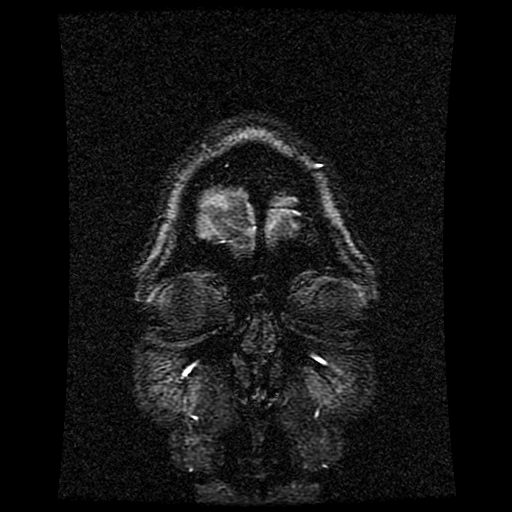
[im 143/143]
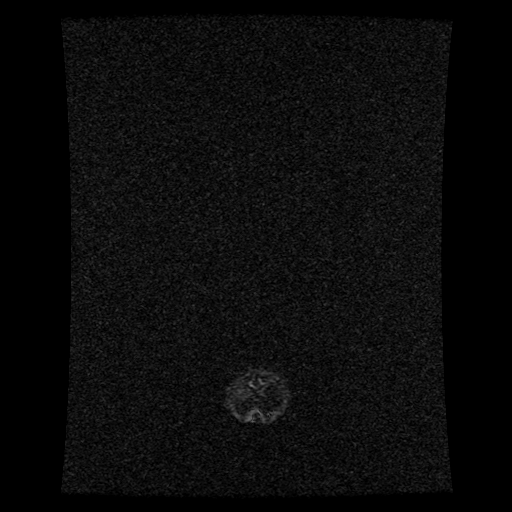

[Series 12: T2 · coronal · 5.0mm · 0.94mm/px · 2 of 30 slices shown (2 of 2)]
[im 1/30]
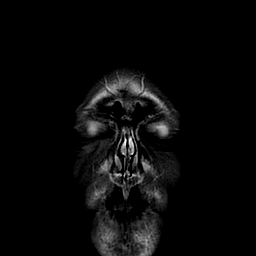
[im 30/30]
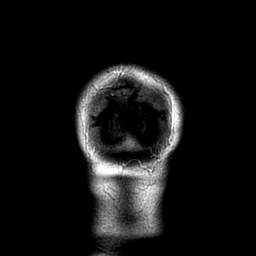

[Series 350: ADC · axial · 3.0mm · 0.94mm/px · z∈[-2,+145]mm · 3 of 48 slices shown (1 of 2)]
[im 1/48]
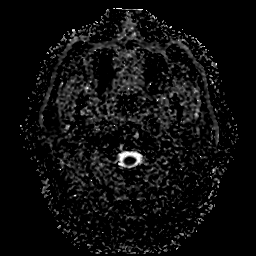
[im 24/48]
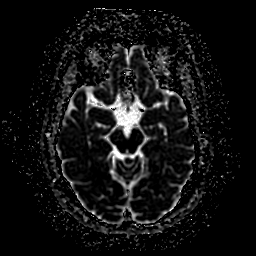
[im 48/48]
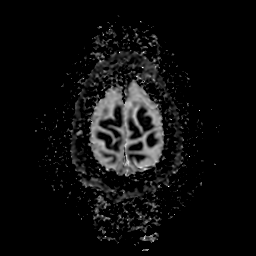

[Series 450: ADC · coronal · 4.0mm · 0.94mm/px · 2 of 36 slices shown (2 of 2)]
[im 1/36]
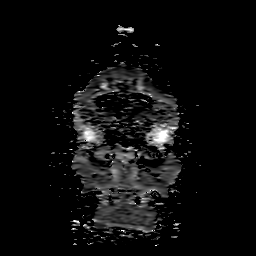
[im 36/36]
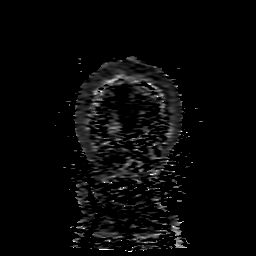

[38 of 48 positions shown; findings below may reference images not displayed]

FINDINGS: Allowing for technical factors, the examination is normal. Superior
sagittal sinus is normal. Transverse and sigmoid sinuses are normal.
Both jugular veins show flow. Deep venous system appears normal.
IMPRESSION: Normal examination. No evidence of venous disease as a cause of
headache.

## 2019-11-12 ENCOUNTER — Ambulatory Visit: Payer: 59 | Admitting: Dermatology

## 2020-02-09 ENCOUNTER — Other Ambulatory Visit: Payer: Self-pay

## 2020-02-09 ENCOUNTER — Ambulatory Visit (INDEPENDENT_AMBULATORY_CARE_PROVIDER_SITE_OTHER): Payer: 59 | Admitting: Dermatology

## 2020-02-09 DIAGNOSIS — R238 Other skin changes: Secondary | ICD-10-CM | POA: Diagnosis not present

## 2020-02-09 DIAGNOSIS — L509 Urticaria, unspecified: Secondary | ICD-10-CM

## 2020-02-09 DIAGNOSIS — L719 Rosacea, unspecified: Secondary | ICD-10-CM

## 2020-02-09 DIAGNOSIS — L578 Other skin changes due to chronic exposure to nonionizing radiation: Secondary | ICD-10-CM

## 2020-02-09 DIAGNOSIS — L570 Actinic keratosis: Secondary | ICD-10-CM

## 2020-02-09 NOTE — Patient Instructions (Addendum)

## 2020-02-09 NOTE — Progress Notes (Signed)
   Follow-Up Visit   Subjective  Frank Wong is a 64 y.o. male who presents for the following: Follow-up (Urticaria and rosacea). Patient presents today for follow up on OV 07/31/19 for Urticaria using Xyzal, Singulair, and Allegra and Rosacea. The patient also has a spot on his lip he would like checked.  The following portions of the chart were reviewed this encounter and updated as appropriate:  Tobacco  Allergies  Meds  Problems  Med Hx  Surg Hx  Fam Hx     Review of Systems:  No other skin or systemic complaints except as noted in HPI or Assessment and Plan.  Objective  Well appearing patient in no apparent distress; mood and affect are within normal limits.  A focused examination was performed including face, neck, chest and back. Relevant physical exam findings are noted in the Assessment and Plan.  Objective  Right Upper Arm - Anterior: Clear today  Objective  Right Alar Crease: Erythema with telangiectasias on nose  Objective  Scalp: Erythematous thin papules/macules with gritty scale.   Objective  Midline Lower Vermilion posterior Lip: 0.4 cm Dilated blood vessel  Images     Assessment & Plan  Urticaria -chronic idiopathic urticaria Body Patient is taking Xyzal, Singulair, and  Allegra.  Patient will plan to wean off Xyzal and Singulair and continue Allegra. Can restart Xyzal and Singulair as needed Discussed exacerbating factors.  Stress seems to be one for him. May consider Xolair in the future if needed for flares.  Rosacea Cheeks and nose Pinkness telangiectasias and minimal papules Patient defers treatment at this time  AK (actinic keratosis) Scalp Cryotherapy today Prior to procedure, discussed risks of blister formation, small wound, skin dyspigmentation, or rare scar following cryotherapy.   Destruction of lesion - Scalp Complexity: simple   Destruction method: cryotherapy   Informed consent: discussed and consent obtained     Timeout:  patient name, date of birth, surgical site, and procedure verified Lesion destroyed using liquid nitrogen: Yes   Region frozen until ice ball extended beyond lesion: Yes   Outcome: patient tolerated procedure well with no complications   Post-procedure details: wound care instructions given    Venous lake of lip Midline Lower Vermilion posterior Lip Seems to blanch with pressure, will take photo today Benign-appearing.  Observation.  Call clinic for new or changing moles.  Recommend daily use of broad spectrum spf 30+ sunscreen to sun-exposed areas.   Will reevaluate on follow-up.  If change return immediately for biopsy.  If change on follow-up will consider biopsy.  See photo taken today.  Actinic Damage - diffuse scaly erythematous macules with underlying dyspigmentation - Recommend daily broad spectrum sunscreen SPF 30+ to sun-exposed areas, reapply every 2 hours as needed.  - Call for new or changing lesions.  Return in about 5 months (around 07/11/2020) for AK and venous lake.  Marene Lenz, CMA, am acting as scribe for Sarina Ser, MD . Documentation: I have reviewed the above documentation for accuracy and completeness, and I agree with the above.  Sarina Ser, MD

## 2020-02-10 ENCOUNTER — Encounter: Payer: Self-pay | Admitting: Dermatology

## 2020-07-15 ENCOUNTER — Other Ambulatory Visit: Payer: Self-pay

## 2020-07-15 ENCOUNTER — Ambulatory Visit: Payer: 59 | Admitting: Dermatology

## 2020-07-15 DIAGNOSIS — L299 Pruritus, unspecified: Secondary | ICD-10-CM

## 2020-07-15 DIAGNOSIS — L578 Other skin changes due to chronic exposure to nonionizing radiation: Secondary | ICD-10-CM

## 2020-07-15 DIAGNOSIS — L501 Idiopathic urticaria: Secondary | ICD-10-CM

## 2020-07-15 DIAGNOSIS — R238 Other skin changes: Secondary | ICD-10-CM

## 2020-07-15 DIAGNOSIS — L570 Actinic keratosis: Secondary | ICD-10-CM | POA: Diagnosis not present

## 2020-07-15 NOTE — Progress Notes (Signed)
   Follow-Up Visit   Subjective  Frank Wong is a 65 y.o. male who presents for the following: Actinic Keratosis (5 months f/u AK on the scalp treated with LN2 ).  Recheck lower lip venous lake. Pt c/o itchy scrotum, think it could be related to his Urticaria   The following portions of the chart were reviewed this encounter and updated as appropriate:   Tobacco  Allergies  Meds  Problems  Med Hx  Surg Hx  Fam Hx     Review of Systems:  No other skin or systemic complaints except as noted in HPI or Assessment and Plan.  Objective  Well appearing patient in no apparent distress; mood and affect are within normal limits.  A focused examination was performed including face,scalp . Relevant physical exam findings are noted in the Assessment and Plan.  Objective  crown scalp: Erythematous thin papules/macules with gritty scale.   Objective  Midline Lower Vermilion posterior Lip: 0.2 x 0.3 cm Dilated blood vessel  Images    Objective  exts, trunk: Clear skin   Objective  scrotum: No exam    Assessment & Plan  AK (actinic keratosis) crown scalp  Destruction of lesion - crown scalp Complexity: simple   Destruction method: cryotherapy   Informed consent: discussed and consent obtained   Timeout:  patient name, date of birth, surgical site, and procedure verified Lesion destroyed using liquid nitrogen: Yes   Region frozen until ice ball extended beyond lesion: Yes   Outcome: patient tolerated procedure well with no complications   Post-procedure details: wound care instructions given    Venous lake of lip Midline Lower Vermilion posterior Lip Stable - no change Benign-appearing.  Observation.  Call clinic for new or changing spots.  Recommend daily use of broad spectrum spf 30+ sunscreen to sun-exposed areas.    Chronic Idiopathic urticaria exts, trunk Urticaria -chronic idiopathic urticaria Patient is taking Xyzal, Singulair, and  Allegra.  Patient will  plan to wean off Xyzal and Singulair and continue Allegra. Can restart Xyzal and Singulair as needed  Discussed exacerbating factors.  Stress seems to be one for him. May consider Xolair in the future if needed for flares. Care for most of this is per Dr Donneta Romberg - Allergist  Pruritus scrotum Pruritus of the scrotum  May be 2ndary to Urticaria Discussed Elidel may help, pt defers topical treatment today   Actinic Damage - chronic, secondary to cumulative UV radiation exposure/sun exposure over time - diffuse scaly erythematous macules with underlying dyspigmentation - Recommend daily broad spectrum sunscreen SPF 30+ to sun-exposed areas, reapply every 2 hours as needed.  - Call for new or changing lesions.  Return in about 7 months (around 02/12/2021) for [name], Venous Lake .  IMarye Round, CMA, am acting as scribe for Sarina Ser, MD .  Documentation: I have reviewed the above documentation for accuracy and completeness, and I agree with the above.  Sarina Ser, MD

## 2020-07-19 ENCOUNTER — Encounter: Payer: Self-pay | Admitting: Dermatology

## 2020-08-19 ENCOUNTER — Other Ambulatory Visit: Payer: Self-pay | Admitting: Gastroenterology

## 2020-08-19 DIAGNOSIS — R1032 Left lower quadrant pain: Secondary | ICD-10-CM

## 2020-08-31 ENCOUNTER — Ambulatory Visit
Admission: RE | Admit: 2020-08-31 | Discharge: 2020-08-31 | Disposition: A | Discharge status: Home / Self Care | Payer: 59 | Source: Ambulatory Visit | Attending: Gastroenterology | Admitting: Gastroenterology

## 2020-08-31 ENCOUNTER — Other Ambulatory Visit: Payer: Self-pay

## 2020-08-31 DIAGNOSIS — R1032 Left lower quadrant pain: Secondary | ICD-10-CM | POA: Insufficient documentation

## 2020-08-31 MED ORDER — IOHEXOL 300 MG/ML  SOLN
100.0000 mL | Freq: Once | INTRAMUSCULAR | Status: AC | PRN
  Administered 2020-08-31: 100 mL via INTRAVENOUS

## 2020-09-23 ENCOUNTER — Other Ambulatory Visit
Admission: RE | Admit: 2020-09-23 | Discharge: 2020-09-23 | Disposition: A | Discharge status: Home / Self Care | Payer: 59 | Source: Ambulatory Visit | Attending: Specialist | Admitting: Specialist

## 2020-09-23 DIAGNOSIS — R0789 Other chest pain: Secondary | ICD-10-CM | POA: Insufficient documentation

## 2020-09-23 LAB — D-DIMER, QUANTITATIVE: D-Dimer, Quant: 0.33 ug/mL-FEU (ref 0.00–0.50)

## 2020-10-06 ENCOUNTER — Encounter: Payer: Self-pay | Admitting: Cardiology

## 2020-10-06 ENCOUNTER — Encounter
Admission: RE | Disposition: A | Discharge status: Home / Self Care | Payer: Self-pay | Source: Home / Self Care | Attending: Cardiology

## 2020-10-06 ENCOUNTER — Other Ambulatory Visit: Payer: Self-pay

## 2020-10-06 ENCOUNTER — Observation Stay
Admission: RE | Admit: 2020-10-06 | Discharge: 2020-10-07 | Disposition: A | Discharge status: Home / Self Care | Payer: 59 | Attending: Cardiology | Admitting: Cardiology

## 2020-10-06 DIAGNOSIS — I25118 Atherosclerotic heart disease of native coronary artery with other forms of angina pectoris: Secondary | ICD-10-CM | POA: Diagnosis not present

## 2020-10-06 DIAGNOSIS — R9430 Abnormal result of cardiovascular function study, unspecified: Secondary | ICD-10-CM

## 2020-10-06 DIAGNOSIS — Z7982 Long term (current) use of aspirin: Secondary | ICD-10-CM | POA: Diagnosis not present

## 2020-10-06 DIAGNOSIS — J45909 Unspecified asthma, uncomplicated: Secondary | ICD-10-CM | POA: Diagnosis not present

## 2020-10-06 DIAGNOSIS — Z87891 Personal history of nicotine dependence: Secondary | ICD-10-CM | POA: Insufficient documentation

## 2020-10-06 DIAGNOSIS — E119 Type 2 diabetes mellitus without complications: Secondary | ICD-10-CM | POA: Insufficient documentation

## 2020-10-06 DIAGNOSIS — Z79899 Other long term (current) drug therapy: Secondary | ICD-10-CM | POA: Insufficient documentation

## 2020-10-06 DIAGNOSIS — N189 Chronic kidney disease, unspecified: Secondary | ICD-10-CM | POA: Insufficient documentation

## 2020-10-06 DIAGNOSIS — I129 Hypertensive chronic kidney disease with stage 1 through stage 4 chronic kidney disease, or unspecified chronic kidney disease: Secondary | ICD-10-CM | POA: Diagnosis not present

## 2020-10-06 DIAGNOSIS — R0789 Other chest pain: Secondary | ICD-10-CM | POA: Diagnosis present

## 2020-10-06 DIAGNOSIS — I209 Angina pectoris, unspecified: Secondary | ICD-10-CM | POA: Diagnosis present

## 2020-10-06 DIAGNOSIS — J449 Chronic obstructive pulmonary disease, unspecified: Secondary | ICD-10-CM | POA: Diagnosis not present

## 2020-10-06 DIAGNOSIS — Z955 Presence of coronary angioplasty implant and graft: Secondary | ICD-10-CM

## 2020-10-06 DIAGNOSIS — I200 Unstable angina: Secondary | ICD-10-CM | POA: Diagnosis present

## 2020-10-06 DIAGNOSIS — Z7984 Long term (current) use of oral hypoglycemic drugs: Secondary | ICD-10-CM | POA: Insufficient documentation

## 2020-10-06 DIAGNOSIS — R079 Chest pain, unspecified: Secondary | ICD-10-CM

## 2020-10-06 HISTORY — PX: LEFT HEART CATH AND CORONARY ANGIOGRAPHY: CATH118249

## 2020-10-06 HISTORY — PX: CORONARY STENT INTERVENTION: CATH118234

## 2020-10-06 LAB — GLUCOSE, CAPILLARY: Glucose-Capillary: 81 mg/dL (ref 70–99)

## 2020-10-06 LAB — POCT ACTIVATED CLOTTING TIME: Activated Clotting Time: 309 seconds

## 2020-10-06 SURGERY — LEFT HEART CATH AND CORONARY ANGIOGRAPHY
Anesthesia: Moderate Sedation

## 2020-10-06 MED ORDER — HYDRALAZINE HCL 20 MG/ML IJ SOLN: 10.0000 mg | INTRAMUSCULAR | Status: AC | PRN

## 2020-10-06 MED ORDER — SODIUM CHLORIDE 0.9% FLUSH: 3.0000 mL | INTRAVENOUS | Status: DC | PRN

## 2020-10-06 MED ORDER — HEPARIN (PORCINE) IN NACL 1000-0.9 UT/500ML-% IV SOLN
INTRAVENOUS | Status: AC
  Filled 2020-10-06: qty 1000

## 2020-10-06 MED ORDER — VERAPAMIL HCL 2.5 MG/ML IV SOLN
INTRAVENOUS | Status: AC
  Filled 2020-10-06: qty 2

## 2020-10-06 MED ORDER — SODIUM CHLORIDE 0.9 % WEIGHT BASED INFUSION: 1.0000 mL/kg/h | INTRAVENOUS | Status: DC

## 2020-10-06 MED ORDER — ONDANSETRON HCL 4 MG/2ML IJ SOLN: 4.0000 mg | Freq: Four times a day (QID) | INTRAMUSCULAR | Status: DC | PRN

## 2020-10-06 MED ORDER — SODIUM CHLORIDE 0.9 % WEIGHT BASED INFUSION: 3.0000 mL/kg/h | INTRAVENOUS | Status: AC

## 2020-10-06 MED ORDER — CLOPIDOGREL BISULFATE 75 MG PO TABS
ORAL_TABLET | ORAL | Status: DC | PRN
  Administered 2020-10-06: 300 mg via ORAL

## 2020-10-06 MED ORDER — ASPIRIN 81 MG PO CHEW: 81.0000 mg | CHEWABLE_TABLET | ORAL | Status: DC

## 2020-10-06 MED ORDER — BENAZEPRIL HCL 20 MG PO TABS
20.0000 mg | ORAL_TABLET | Freq: Every day | ORAL | Status: DC
  Administered 2020-10-07: 20 mg via ORAL
  Filled 2020-10-06 (×2): qty 1

## 2020-10-06 MED ORDER — FENTANYL CITRATE (PF) 100 MCG/2ML IJ SOLN
INTRAMUSCULAR | Status: DC | PRN
  Administered 2020-10-06: 25 ug via INTRAVENOUS

## 2020-10-06 MED ORDER — MONTELUKAST SODIUM 10 MG PO TABS
10.0000 mg | ORAL_TABLET | Freq: Every day | ORAL | Status: DC
  Administered 2020-10-06: 10 mg via ORAL
  Filled 2020-10-06: qty 1

## 2020-10-06 MED ORDER — ASPIRIN 81 MG PO CHEW
CHEWABLE_TABLET | ORAL | Status: AC
  Filled 2020-10-06: qty 3

## 2020-10-06 MED ORDER — HEPARIN SODIUM (PORCINE) 1000 UNIT/ML IJ SOLN
INTRAMUSCULAR | Status: AC
  Filled 2020-10-06: qty 1

## 2020-10-06 MED ORDER — MAGNESIUM OXIDE -MG SUPPLEMENT 400 (240 MG) MG PO TABS
400.0000 mg | ORAL_TABLET | Freq: Every day | ORAL | Status: DC
  Administered 2020-10-07: 400 mg via ORAL
  Filled 2020-10-06: qty 1

## 2020-10-06 MED ORDER — SODIUM CHLORIDE 0.9 % IV SOLN: 250.0000 mL | INTRAVENOUS | Status: DC | PRN

## 2020-10-06 MED ORDER — LABETALOL HCL 5 MG/ML IV SOLN: 10.0000 mg | INTRAVENOUS | Status: AC | PRN

## 2020-10-06 MED ORDER — HEPARIN SODIUM (PORCINE) 1000 UNIT/ML IJ SOLN
INTRAMUSCULAR | Status: DC | PRN
  Administered 2020-10-06: 7000 [IU] via INTRAVENOUS

## 2020-10-06 MED ORDER — CLOPIDOGREL BISULFATE 75 MG PO TABS
75.0000 mg | ORAL_TABLET | Freq: Every day | ORAL | Status: DC
  Administered 2020-10-07: 75 mg via ORAL
  Filled 2020-10-06: qty 1

## 2020-10-06 MED ORDER — IOHEXOL 300 MG/ML  SOLN
INTRAMUSCULAR | Status: DC | PRN
  Administered 2020-10-06: 112 mL via INTRA_ARTERIAL

## 2020-10-06 MED ORDER — FENTANYL CITRATE (PF) 100 MCG/2ML IJ SOLN
INTRAMUSCULAR | Status: AC
  Filled 2020-10-06: qty 2

## 2020-10-06 MED ORDER — IOHEXOL 300 MG/ML  SOLN
INTRAMUSCULAR | Status: DC | PRN
  Administered 2020-10-06: 68 mL

## 2020-10-06 MED ORDER — DICYCLOMINE HCL 10 MG PO CAPS
10.0000 mg | ORAL_CAPSULE | Freq: Three times a day (TID) | ORAL | Status: DC
  Administered 2020-10-06 – 2020-10-07 (×2): 10 mg via ORAL
  Filled 2020-10-06 (×7): qty 1

## 2020-10-06 MED ORDER — MIDAZOLAM HCL 2 MG/2ML IJ SOLN
INTRAMUSCULAR | Status: DC | PRN
  Administered 2020-10-06: 1 mg via INTRAVENOUS

## 2020-10-06 MED ORDER — ACETAMINOPHEN 325 MG PO TABS: 650.0000 mg | ORAL_TABLET | ORAL | Status: DC | PRN

## 2020-10-06 MED ORDER — ATORVASTATIN CALCIUM 80 MG PO TABS
80.0000 mg | ORAL_TABLET | Freq: Every day | ORAL | Status: DC
  Administered 2020-10-07: 80 mg via ORAL
  Filled 2020-10-06: qty 1

## 2020-10-06 MED ORDER — HEPARIN (PORCINE) IN NACL 1000-0.9 UT/500ML-% IV SOLN
INTRAVENOUS | Status: DC | PRN
  Administered 2020-10-06: 500 mL

## 2020-10-06 MED ORDER — SODIUM CHLORIDE 0.9 % WEIGHT BASED INFUSION
1.0000 mL/kg/h | INTRAVENOUS | Status: AC
  Administered 2020-10-06: 1 mL/kg/h via INTRAVENOUS

## 2020-10-06 MED ORDER — UMECLIDINIUM-VILANTEROL 62.5-25 MCG/INH IN AEPB
1.0000 | INHALATION_SPRAY | Freq: Every day | RESPIRATORY_TRACT | Status: DC
  Administered 2020-10-07: 1 via RESPIRATORY_TRACT
  Filled 2020-10-06: qty 14

## 2020-10-06 MED ORDER — HEPARIN SODIUM (PORCINE) 1000 UNIT/ML IJ SOLN
INTRAMUSCULAR | Status: DC | PRN
  Administered 2020-10-06: 4000 [IU] via INTRAVENOUS

## 2020-10-06 MED ORDER — GABAPENTIN 600 MG PO TABS
300.0000 mg | ORAL_TABLET | Freq: Every day | ORAL | Status: DC
  Administered 2020-10-06: 300 mg via ORAL
  Filled 2020-10-06: qty 1

## 2020-10-06 MED ORDER — VERAPAMIL HCL 2.5 MG/ML IV SOLN
INTRAVENOUS | Status: DC | PRN
Start: 2020-10-06 — End: 2020-10-06
  Administered 2020-10-06: 2.5 mg via INTRA_ARTERIAL

## 2020-10-06 MED ORDER — ASPIRIN 81 MG PO CHEW
81.0000 mg | CHEWABLE_TABLET | Freq: Every day | ORAL | Status: DC
  Administered 2020-10-07: 81 mg via ORAL
  Filled 2020-10-06: qty 1

## 2020-10-06 MED ORDER — GLIPIZIDE 10 MG PO TABS
10.0000 mg | ORAL_TABLET | Freq: Every day | ORAL | Status: DC
  Administered 2020-10-07: 10 mg via ORAL
  Filled 2020-10-06: qty 1

## 2020-10-06 MED ORDER — CLOPIDOGREL BISULFATE 75 MG PO TABS: 75.0000 mg | ORAL_TABLET | Freq: Every day | ORAL | Status: DC

## 2020-10-06 MED ORDER — CLOPIDOGREL BISULFATE 75 MG PO TABS
ORAL_TABLET | ORAL | Status: AC
  Filled 2020-10-06: qty 4

## 2020-10-06 MED ORDER — ASPIRIN 81 MG PO CHEW
CHEWABLE_TABLET | ORAL | Status: DC | PRN
  Administered 2020-10-06: 243 mg via ORAL

## 2020-10-06 MED ORDER — MIDAZOLAM HCL 2 MG/2ML IJ SOLN
INTRAMUSCULAR | Status: AC
  Filled 2020-10-06: qty 2

## 2020-10-06 MED ORDER — SODIUM CHLORIDE 0.9% FLUSH
3.0000 mL | Freq: Two times a day (BID) | INTRAVENOUS | Status: DC
  Administered 2020-10-06 – 2020-10-07 (×3): 3 mL via INTRAVENOUS

## 2020-10-06 MED ORDER — EMPAGLIFLOZIN 25 MG PO TABS
25.0000 mg | ORAL_TABLET | Freq: Every day | ORAL | Status: DC
  Administered 2020-10-07: 25 mg via ORAL
  Filled 2020-10-06 (×2): qty 1

## 2020-10-06 MED ORDER — HEPARIN (PORCINE) IN NACL 1000-0.9 UT/500ML-% IV SOLN
INTRAVENOUS | Status: DC | PRN
Start: 2020-10-06 — End: 2020-10-06
  Administered 2020-10-06: 500 mL

## 2020-10-06 MED ORDER — SODIUM CHLORIDE 0.9% FLUSH
3.0000 mL | Freq: Two times a day (BID) | INTRAVENOUS | Status: DC
  Administered 2020-10-07: 3 mL via INTRAVENOUS

## 2020-10-06 SURGICAL SUPPLY — 19 items
BALLN TREK RX 2.25X15 (BALLOONS) ×2
BALLN ~~LOC~~ EUPHORA RX 3.0X15 (BALLOONS) ×2
BALLOON TREK RX 2.25X15 (BALLOONS) ×1 IMPLANT
BALLOON ~~LOC~~ EUPHORA RX 3.0X15 (BALLOONS) ×1 IMPLANT
CATH INFINITI 5 FR JL3.5 (CATHETERS) ×2 IMPLANT
CATH INFINITI JR4 5F (CATHETERS) ×2 IMPLANT
CATH VISTA GUIDE 6FR JL3.5 (CATHETERS) ×2 IMPLANT
DEVICE RAD TR BAND REGULAR (VASCULAR PRODUCTS) ×2 IMPLANT
DRAPE BRACHIAL (DRAPES) ×2 IMPLANT
GLIDESHEATH SLEND SS 6F .021 (SHEATH) ×2 IMPLANT
GUIDEWIRE INQWIRE 1.5J.035X260 (WIRE) ×1 IMPLANT
INQWIRE 1.5J .035X260CM (WIRE) ×2
KIT ENCORE 26 ADVANTAGE (KITS) ×2 IMPLANT
PACK CARDIAC CATH (CUSTOM PROCEDURE TRAY) ×2 IMPLANT
PROTECTION STATION PRESSURIZED (MISCELLANEOUS) ×2
SET ATX SIMPLICITY (MISCELLANEOUS) ×2 IMPLANT
STATION PROTECTION PRESSURIZED (MISCELLANEOUS) ×1 IMPLANT
STENT RESOLUTE ONYX 2.75X22 (Permanent Stent) ×2 IMPLANT
WIRE ASAHI PROWATER 180CM (WIRE) ×2 IMPLANT

## 2020-10-06 NOTE — Plan of Care (Signed)

## 2020-10-07 ENCOUNTER — Encounter: Payer: Self-pay | Admitting: Internal Medicine

## 2020-10-07 DIAGNOSIS — I25118 Atherosclerotic heart disease of native coronary artery with other forms of angina pectoris: Secondary | ICD-10-CM | POA: Diagnosis not present

## 2020-10-07 DIAGNOSIS — Z955 Presence of coronary angioplasty implant and graft: Secondary | ICD-10-CM

## 2020-10-07 LAB — BASIC METABOLIC PANEL
Anion gap: 6 (ref 5–15)
BUN: 21 mg/dL (ref 8–23)
CO2: 22 mmol/L (ref 22–32)
Calcium: 8.6 mg/dL — ABNORMAL LOW (ref 8.9–10.3)
Chloride: 109 mmol/L (ref 98–111)
Creatinine, Ser: 1.24 mg/dL (ref 0.61–1.24)
GFR, Estimated: >60 mL/min (ref >60)
Glucose, Bld: 138 mg/dL — ABNORMAL HIGH (ref 70–99)
Potassium: 4.1 mmol/L (ref 3.5–5.1)
Sodium: 137 mmol/L (ref 135–145)

## 2020-10-07 LAB — CBC
HCT: 38.7 % — ABNORMAL LOW (ref 39.0–52.0)
Hemoglobin: 13.5 g/dL (ref 13.0–17.0)
MCH: 32.1 pg (ref 26.0–34.0)
MCHC: 34.9 g/dL (ref 30.0–36.0)
MCV: 92.1 fL (ref 80.0–100.0)
Platelets: 141 10*3/uL — ABNORMAL LOW (ref 150–400)
RBC: 4.2 MIL/uL — ABNORMAL LOW (ref 4.22–5.81)
RDW: 13.2 % (ref 11.5–15.5)
WBC: 9.7 10*3/uL (ref 4.0–10.5)
nRBC: 0 % (ref 0.0–0.2)

## 2020-10-07 NOTE — Progress Notes (Signed)
Pt ambulated around nurses stations x2/ no complaints of chest pain, SOB, dizziness, or lightheadedness. VSS. Will continue to monitor.

## 2020-10-07 NOTE — Progress Notes (Signed)
Discharge instructions explained/pt verbalized understanding. IV and tele removed. Will transport off unit via wheelchair.  

## 2020-10-07 NOTE — Discharge Summary (Signed)
[facility] Cardiology Discharge Summary  Patient ID: Frank Wong MRN: [id number] DOB/AGE: 1956/02/25 65 y.o.  Admit date: 10/06/2020 Discharge date: 10/07/2020  Primary Discharge Diagnosis: Ischemic chest pain I20.9 and Coronary artery disease with angina I25.119 Secondary Discharge Diagnosis diabetes, high blood pressure and high cholesterol  Significant Diagnostic Studies: Cardiac cath with left ventricular angiogram and selective coronary injection as well as PCI and stent placement of left anterior descending.  Hospital Course: The patient was admitted to specials for cardiac cath with selective coronary angiogram after full consent, risk and benefits explained, and time out called with all approprate details voiced and discussed. The patient has had progressive canadian class 3 angina with high probability risk stress test consistent with ischemic chest pain and or anginal equivalent with coronary artery risk factors including diabetes, high blood pressure and high cholesterol. The procedure was performed without complication and it revealed bnormal left ventricular function with ejection fraction of 60%.  It was found that the patient had severe 1 vessel coronary atherosclerosis with significant left anterior descending stenosis  Proximal to stentrequiring further intervention. Therefore, the patient had a PCI and drug eluding stent placed without complication. The patient has been ambulating without further significant symptoms and has reached his maximal hospital benefit and will be discharged to home in good condition.  Cardiac rehabilitation has been discussed and recommended. Medication management of cardiovascular risk factors will be given post discharge and modified as an outpatient.   Discharge Exam: Blood pressure 117/81, pulse (!) 59, temperature 98.5 F (36.9 C), temperature source Oral, resp. rate 18, height 5\' 8"  (1.727 m), weight 85.3 kg, SpO2 94 %.  Constitutional:  Alet oriented to person, place, and time. No distress.  HENT: No nasal discharge.  Head: Normocephalic and atraumatic.  Eyes: Pupils are equal and round. No discharge.  Neck: Normal range of motion. Neck supple. No JVD present. No thyromegaly present.  Cardiovascular: Normal rate, regular rhythm, normal S1 S2, no gallop, no friction rub. No murmur Pulmonary/Chest: Effort normal, No stridor. No respiratory distress. no wheezes.  no rales.    Abdominal: Soft. Bowel sounds are normal.  no distension.  no tenderness. There is no rebound and no guarding.  Musculoskeletal: No edema, no cyanosis, normal pulses, no bleeding, Normal range of motion. no tenderness.  Neurological:  alert and oriented to person, place, and time. Coordination normal.  Skin: Skin is warm and dry. No rash noted. No erythema. No pallor.  Psychiatric:  normal mood and affect. behavior is normal.    Labs:   Lab Results  Component Value Date   WBC 9.7 10/07/2020   HGB 13.5 10/07/2020   HCT 38.7 (L) 10/07/2020   MCV 92.1 10/07/2020   PLT 141 (L) 10/07/2020    Recent Labs  Lab 10/07/20 0340  NA 137  K 4.1  CL 109  CO2 22  BUN 21  CREATININE 1.24  CALCIUM 8.6*  GLUCOSE 138*    EKG: NSR without evidence of new changes  FOLLOW UP IN ONE TO TWO WEEKS Discharge Instructions    AMB Referral to Cardiac Rehabilitation - Phase II   Complete by: As directed    Diagnosis: Coronary Stents   After initial evaluation and assessments completed: Virtual Based Care may be provided alone or in conjunction with Phase 2 Cardiac Rehab based on patient barriers.: Yes     Allergies as of 10/07/2020      Reactions   Penicillins Other (See Comments)   Has patient had  a PCN reaction causing immediate rash, facial/tongue/throat swelling, SOB or lightheadedness with hypotension: No Has patient had a PCN reaction causing severe rash involving mucus membranes or skin necrosis: No Has patient had a PCN reaction that required  hospitalization No Has patient had a PCN reaction occurring within the last 10 years: No If all of the above answers are "NO", then may proceed with Cephalosporin use.   Esomeprazole Magnesium Rash      Medication List    TAKE these medications   aspirin EC 81 MG tablet Take 81 mg by mouth in the morning.   atorvastatin 80 MG tablet Commonly known as: LIPITOR Take 80 mg by mouth in the morning.   B-complex with vitamin C tablet Take 1 tablet by mouth in the morning.   benazepril 20 MG tablet Commonly known as: LOTENSIN Take 20 mg by mouth in the morning.   CINNAMON PO Take 1,000 mg by mouth in the morning and at bedtime.   clonazePAM 2 MG tablet Commonly known as: KLONOPIN Take 1 mg by mouth at bedtime as needed (sleep/ anxiety).   clopidogrel 75 MG tablet Commonly known as: PLAVIX Take 75 mg by mouth in the morning.   dicyclomine 10 MG capsule Commonly known as: BENTYL Take 10 mg by mouth 4 (four) times daily as needed for spasms.   EPINEPHrine 0.3 mg/0.3 mL Soaj injection Commonly known as: EPI-PEN Inject 0.3 mg into the muscle as needed for anaphylaxis.   ferrous sulfate 325 (65 FE) MG tablet Take 325 mg by mouth in the morning.   fexofenadine 180 MG tablet Commonly known as: ALLEGRA Take 180 mg by mouth in the morning.   gabapentin 600 MG tablet Commonly known as: NEURONTIN Take 300 mg by mouth at bedtime.   GaviLyte-G 236 g solution Generic drug: polyethylene glycol Take 4,000 mLs by mouth as directed.   glipiZIDE 10 MG tablet Commonly known as: GLUCOTROL Take 10 mg by mouth every morning.   hyoscyamine 0.125 MG SL tablet Commonly known as: LEVSIN SL Place 0.125 mg under the tongue every 6 (six) hours as needed (abdominal spasms).   IBGARD PO Take 2 tablets by mouth in the morning and at bedtime.   Jardiance 25 MG Tabs tablet Generic drug: empagliflozin Take 25 mg by mouth in the morning.   levocetirizine 5 MG tablet Commonly known as:  XYZAL Take 5 mg by mouth every evening.   magnesium oxide 400 MG tablet Commonly known as: MAG-OX Take 400 mg by mouth in the morning.   melatonin 5 MG Tabs Take 5 mg by mouth at bedtime as needed (sleep).   metFORMIN 1000 MG tablet Commonly known as: GLUCOPHAGE Take 1,000 mg by mouth 2 (two) times daily with a meal.   montelukast 10 MG tablet Commonly known as: SINGULAIR Take 10 mg by mouth at bedtime.   multivitamin with minerals Tabs tablet Take 1 tablet by mouth in the morning.   pantoprazole 40 MG tablet Commonly known as: PROTONIX Take 40 mg by mouth at bedtime.   psyllium 28 % packet Commonly known as: METAMUCIL SMOOTH TEXTURE Take 1 packet by mouth in the morning.   sildenafil 20 MG tablet Commonly known as: REVATIO Take 10 mg by mouth in the morning and at bedtime.   Testosterone 20.25 MG/ACT (1.62%) Gel Apply 2 Pump topically in the morning. Applied to shoulders.   triamcinolone cream 0.1 % Commonly known as: KENALOG Apply 1 application topically 2 (two) times daily as needed (skin irritation/itching).  umeclidinium-vilanterol 62.5-25 MCG/INH Aepb Commonly known as: ANORO ELLIPTA Inhale 1 puff into the lungs in the morning.       Follow-up Information    Corey Skains, MD Follow up in 1 week(s).   Specialty: Cardiology Contact information: [address] [facility] [place] [address] [phone]               THE PATIENT  SHALL BRING ALL MEDICATIONS TO FOLLOW UP APPOINTMENT  Signed:  Corey Skains MD, [facility] 10/07/2020, 9:39 AM

## 2020-10-26 ENCOUNTER — Other Ambulatory Visit: Payer: Self-pay

## 2020-10-26 ENCOUNTER — Encounter: Payer: 59 | Attending: Cardiology | Admitting: *Deleted

## 2020-10-26 ENCOUNTER — Encounter: Payer: Self-pay | Admitting: *Deleted

## 2020-10-26 DIAGNOSIS — Z955 Presence of coronary angioplasty implant and graft: Secondary | ICD-10-CM

## 2020-10-26 NOTE — Progress Notes (Signed)
Completed virtual orientation today.  EP evaluation is scheduled for Monday 6/6 at 8am.  Documentation for diagnosis can be found in [facility] encounter 10/06/20.

## 2020-11-01 ENCOUNTER — Other Ambulatory Visit: Payer: Self-pay

## 2020-11-01 ENCOUNTER — Encounter: Payer: 59 | Attending: Cardiology | Admitting: *Deleted

## 2020-11-01 VITALS — Ht 68.8 in | Wt 184.3 lb

## 2020-11-01 DIAGNOSIS — Z955 Presence of coronary angioplasty implant and graft: Secondary | ICD-10-CM

## 2020-11-01 NOTE — Progress Notes (Signed)
Cardiac Individual Treatment Plan  Patient Details  Name: Frank Wong MRN: [id number] Date of Birth: 03/15/1956 Referring Provider:   Flowsheet Row Cardiac Rehab from 11/01/2020 in [facility] Cardiac and Pulmonary Rehab  Referring Provider [name] MD      Initial Encounter Date:  Flowsheet Row Cardiac Rehab from 11/01/2020 in [facility] Cardiac and Pulmonary Rehab  Date 11/01/20      Visit Diagnosis: Status post coronary artery stent placement  Patient's Home Medications on Admission:  Current Outpatient Medications:  .  aspirin EC 81 MG tablet, Take 81 mg by mouth in the morning., Disp: , Rfl:  .  atorvastatin (LIPITOR) 80 MG tablet, Take 80 mg by mouth in the morning., Disp: , Rfl:  .  B Complex-C (B-COMPLEX WITH VITAMIN C) tablet, Take 1 tablet by mouth in the morning., Disp: , Rfl:  .  benazepril (LOTENSIN) 20 MG tablet, Take 20 mg by mouth in the morning., Disp: , Rfl:  .  CINNAMON PO, Take 1,000 mg by mouth in the morning and at bedtime., Disp: , Rfl:  .  clonazePAM (KLONOPIN) 2 MG tablet, Take 1 mg by mouth at bedtime as needed (sleep/ anxiety)., Disp: , Rfl:  .  clopidogrel (PLAVIX) 75 MG tablet, Take 75 mg by mouth in the morning., Disp: , Rfl:  .  dicyclomine (BENTYL) 10 MG capsule, Take 10 mg by mouth 4 (four) times daily as needed for spasms., Disp: , Rfl:  .  EPINEPHrine 0.3 mg/0.3 mL IJ SOAJ injection, Inject 0.3 mg into the muscle as needed for anaphylaxis., Disp: , Rfl:  .  ferrous sulfate 325 (65 FE) MG tablet, Take 325 mg by mouth in the morning., Disp: , Rfl:  .  fexofenadine (ALLEGRA) 180 MG tablet, Take 180 mg by mouth in the morning., Disp: , Rfl:  .  gabapentin (NEURONTIN) 600 MG tablet, Take 300 mg by mouth at bedtime., Disp: , Rfl:  .  GAVILYTE-G 236 g solution, Take 4,000 mLs by mouth as directed., Disp: , Rfl:  .  glipiZIDE (GLUCOTROL) 10 MG tablet, Take 10 mg by mouth every morning., Disp: , Rfl:  .  hyoscyamine (LEVSIN SL) 0.125 MG SL tablet, Place  0.125 mg under the tongue every 6 (six) hours as needed (abdominal spasms)., Disp: , Rfl:  .  JARDIANCE 25 MG TABS tablet, Take 25 mg by mouth in the morning., Disp: , Rfl:  .  levocetirizine (XYZAL) 5 MG tablet, Take 5 mg by mouth every evening., Disp: , Rfl:  .  magnesium oxide (MAG-OX) 400 MG tablet, Take 400 mg by mouth in the morning., Disp: , Rfl:  .  melatonin 5 MG TABS, Take 5 mg by mouth at bedtime as needed (sleep)., Disp: , Rfl:  .  metFORMIN (GLUCOPHAGE) 1000 MG tablet, Take 1,000 mg by mouth 2 (two) times daily with a meal. , Disp: , Rfl:  .  montelukast (SINGULAIR) 10 MG tablet, Take 10 mg by mouth at bedtime., Disp: , Rfl:  .  Multiple Vitamin (MULTIVITAMIN WITH MINERALS) TABS tablet, Take 1 tablet by mouth in the morning., Disp: , Rfl:  .  pantoprazole (PROTONIX) 40 MG tablet, Take 40 mg by mouth at bedtime., Disp: , Rfl:  .  Peppermint Oil (IBGARD PO), Take 2 tablets by mouth in the morning and at bedtime., Disp: , Rfl:  .  psyllium (METAMUCIL SMOOTH TEXTURE) 28 % packet, Take 1 packet by mouth in the morning., Disp: , Rfl:  .  sildenafil (REVATIO) 20 MG  tablet, Take 10 mg by mouth in the morning and at bedtime., Disp: , Rfl:  .  Testosterone 20.25 MG/ACT (1.62%) GEL, Apply 2 Pump topically in the morning. Applied to shoulders., Disp: , Rfl:  .  triamcinolone cream (KENALOG) 0.1 %, Apply 1 application topically 2 (two) times daily as needed (skin irritation/itching)., Disp: , Rfl:  .  umeclidinium-vilanterol (ANORO ELLIPTA) 62.5-25 MCG/INH AEPB, Inhale 1 puff into the lungs in the morning., Disp: , Rfl:   Past Medical History: Past Medical History:  Diagnosis Date  . Asthma   . CAD (coronary artery disease)   . Cardiac arrest ([place])   . Diabetes mellitus without complication ([place])   . GERD (gastroesophageal reflux disease)   . Heart attack ([place])   . Hypertension   . Shoulder problem 2010   right  . Sleep apnea     Tobacco Use: Social History   Tobacco Use  Smoking  Status Former Smoker  Smokeless Tobacco Former Systems developer    Labs: Recent Review Flowsheet Data   There is no flowsheet data to display.      Exercise Target Goals: Exercise Program Goal: Individual exercise prescription set using results from initial 6 min walk test and THRR while considering  patient's activity barriers and safety.   Exercise Prescription Goal: Initial exercise prescription builds to 30-45 minutes a day of aerobic activity, 2-3 days per week.  Home exercise guidelines will be given to patient during program as part of exercise prescription that the participant will acknowledge.   Education: Aerobic Exercise: - Group verbal and visual presentation on the components of exercise prescription. Introduces F.I.T.T principle from ACSM for exercise prescriptions.  Reviews F.I.T.T. principles of aerobic exercise including progression. Written material given at graduation. Flowsheet Row Cardiac Rehab from 11/01/2020 in [facility] Cardiac and Pulmonary Rehab  Education need identified 11/01/20      Education: Resistance Exercise: - Group verbal and visual presentation on the components of exercise prescription. Introduces F.I.T.T principle from ACSM for exercise prescriptions  Reviews F.I.T.T. principles of resistance exercise including progression. Written material given at graduation.    Education: Exercise & Equipment Safety: - Individual verbal instruction and demonstration of equipment use and safety with use of the equipment. Flowsheet Row Cardiac Rehab from 11/01/2020 in [facility] Cardiac and Pulmonary Rehab  Date 11/01/20  Educator [facility]  Instruction Review Code 1- Verbalizes Understanding      Education: Exercise Physiology & General Exercise Guidelines: - Group verbal and written instruction with models to review the exercise physiology of the cardiovascular system and associated critical values. Provides general exercise guidelines with specific guidelines to those with heart or  lung disease.    Education: Flexibility, Balance, Mind/Body Relaxation: - Group verbal and visual presentation with interactive activity on the components of exercise prescription. Introduces F.I.T.T principle from ACSM for exercise prescriptions. Reviews F.I.T.T. principles of flexibility and balance exercise training including progression. Also discusses the mind body connection.  Reviews various relaxation techniques to help reduce and manage stress (i.e. Deep breathing, progressive muscle relaxation, and visualization). Balance handout provided to take home. Written material given at graduation.   Activity Barriers & Risk Stratification:  Activity Barriers & Cardiac Risk Stratification - 11/01/20 0920      Activity Barriers & Cardiac Risk Stratification   Activity Barriers Muscular Weakness;Deconditioning;Joint Problems;Other (comment);Back Problems    Comments hx of sports injuries, played football and refereed soccer    Cardiac Risk Stratification Moderate  6 Minute Walk:  6 Minute Walk    Row Name 11/01/20 0920         6 Minute Walk   Phase Initial     Distance 1455 feet     Walk Time 6 minutes     # of Rest Breaks 0     MPH 2.76     METS 3.78     RPE 9     Perceived Dyspnea  1     VO2 Peak 13.22     Symptoms No     Resting HR 74 bpm     Resting BP 134/72     Resting Oxygen Saturation  98 %     Exercise Oxygen Saturation  during 6 min walk 98 %     Max Ex. HR 110 bpm     Max Ex. BP 136/70     2 Minute Post BP 114/66            Oxygen Initial Assessment:   Oxygen Re-Evaluation:   Oxygen Discharge (Final Oxygen Re-Evaluation):   Initial Exercise Prescription:  Initial Exercise Prescription - 11/01/20 0900      Date of Initial Exercise RX and Referring Provider   Date 11/01/20    Referring Provider Paraschos, Alexander MD      Treadmill   MPH 2.7    Grade 1    Minutes 15    METs 3.44      Recumbant Bike   Level 3    RPM 50    Watts  47    Minutes 15    METs 3.5      REL-XR   Level 3    Speed 50    Minutes 15    METs 3.5      Track   Laps 40    Minutes 15    METs 3.2      Prescription Details   Frequency (times per week) 3    Duration Progress to 30 minutes of continuous aerobic without signs/symptoms of physical distress      Intensity   THRR 40-80% of Max Heartrate 108-141    Ratings of Perceived Exertion 11-13    Perceived Dyspnea 0-4      Progression   Progression Continue to progress workloads to maintain intensity without signs/symptoms of physical distress.      Resistance Training   Training Prescription Yes    Weight 4 lb    Reps 10-15           Perform Capillary Blood Glucose checks as needed.  Exercise Prescription Changes:  Exercise Prescription Changes    Row Name 11/01/20 0900             Response to Exercise   Blood Pressure (Admit) 134/72       Blood Pressure (Exercise) 136/70       Blood Pressure (Exit) 114/66       Heart Rate (Admit) 74 bpm       Heart Rate (Exercise) 110 bpm       Heart Rate (Exit) 87 bpm       Oxygen Saturation (Admit) 98 %       Oxygen Saturation (Exercise) 98 %       Rating of Perceived Exertion (Exercise) 9       Perceived Dyspnea (Exercise) 1       Symptoms SOB       Comments walk test results  Exercise Comments:   Exercise Goals and Review:  Exercise Goals    Row Name 11/01/20 0926             Exercise Goals   Increase Physical Activity Yes       Intervention Provide advice, education, support and counseling about physical activity/exercise needs.;Develop an individualized exercise prescription for aerobic and resistive training based on initial evaluation findings, risk stratification, comorbidities and participant's personal goals.       Expected Outcomes Long Term: Add in home exercise to make exercise part of routine and to increase amount of physical activity.;Short Term: Attend rehab on a regular basis to  increase amount of physical activity.;Long Term: Exercising regularly at least 3-5 days a week.       Increase Strength and Stamina Yes       Intervention Provide advice, education, support and counseling about physical activity/exercise needs.;Develop an individualized exercise prescription for aerobic and resistive training based on initial evaluation findings, risk stratification, comorbidities and participant's personal goals.       Expected Outcomes Short Term: Increase workloads from initial exercise prescription for resistance, speed, and METs.;Short Term: Perform resistance training exercises routinely during rehab and add in resistance training at home;Long Term: Improve cardiorespiratory fitness, muscular endurance and strength as measured by increased METs and functional capacity (6MWT)       Able to understand and use rate of perceived exertion (RPE) scale Yes       Intervention Provide education and explanation on how to use RPE scale       Expected Outcomes Long Term:  Able to use RPE to guide intensity level when exercising independently;Short Term: Able to use RPE daily in rehab to express subjective intensity level       Able to understand and use Dyspnea scale Yes       Intervention Provide education and explanation on how to use Dyspnea scale       Expected Outcomes Short Term: Able to use Dyspnea scale daily in rehab to express subjective sense of shortness of breath during exertion;Long Term: Able to use Dyspnea scale to guide intensity level when exercising independently       Knowledge and understanding of Target Heart Rate Range (THRR) Yes       Intervention Provide education and explanation of THRR including how the numbers were predicted and where they are located for reference       Expected Outcomes Short Term: Able to state/look up THRR;Short Term: Able to use daily as guideline for intensity in rehab;Long Term: Able to use THRR to govern intensity when exercising  independently       Able to check pulse independently Yes       Intervention Provide education and demonstration on how to check pulse in carotid and radial arteries.;Review the importance of being able to check your own pulse for safety during independent exercise       Expected Outcomes Short Term: Able to explain why pulse checking is important during independent exercise;Long Term: Able to check pulse independently and accurately       Understanding of Exercise Prescription Yes       Intervention Provide education, explanation, and written materials on patient's individual exercise prescription       Expected Outcomes Short Term: Able to explain program exercise prescription;Long Term: Able to explain home exercise prescription to exercise independently              Exercise Goals Re-Evaluation :  Discharge Exercise Prescription (Final Exercise Prescription Changes):  Exercise Prescription Changes - 11/01/20 0900      Response to Exercise   Blood Pressure (Admit) 134/72    Blood Pressure (Exercise) 136/70    Blood Pressure (Exit) 114/66    Heart Rate (Admit) 74 bpm    Heart Rate (Exercise) 110 bpm    Heart Rate (Exit) 87 bpm    Oxygen Saturation (Admit) 98 %    Oxygen Saturation (Exercise) 98 %    Rating of Perceived Exertion (Exercise) 9    Perceived Dyspnea (Exercise) 1    Symptoms SOB    Comments walk test results           Nutrition:  Target Goals: Understanding of nutrition guidelines, daily intake of sodium 1500mg , cholesterol 200mg , calories 30% from fat and 7% or less from saturated fats, daily to have 5 or more servings of fruits and vegetables.  Education: All About Nutrition: -Group instruction provided by verbal, written material, interactive activities, discussions, models, and posters to present general guidelines for heart healthy nutrition including fat, fiber, MyPlate, the role of sodium in heart healthy nutrition, utilization of the nutrition label,  and utilization of this knowledge for meal planning. Follow up email sent as well. Written material given at graduation. Flowsheet Row Cardiac Rehab from 11/01/2020 in [facility] Cardiac and Pulmonary Rehab  Education need identified 11/01/20      Biometrics:  Pre Biometrics - 11/01/20 0926      Pre Biometrics   Height 5' 8.8" (1.748 m)    Weight 184 lb 4.8 oz (83.6 kg)    BMI (Calculated) 27.36    Single Leg Stand 30 seconds            Nutrition Therapy Plan and Nutrition Goals:  Nutrition Therapy & Goals - 10/26/20 0956      Intervention Plan   Intervention Prescribe, educate and counsel regarding individualized specific dietary modifications aiming towards targeted core components such as weight, hypertension, lipid management, diabetes, heart failure and other comorbidities.    Expected Outcomes Short Term Goal: Understand basic principles of dietary content, such as calories, fat, sodium, cholesterol and nutrients.;Short Term Goal: A plan has been developed with personal nutrition goals set during dietitian appointment.;Long Term Goal: Adherence to prescribed nutrition plan.           Nutrition Assessments:  MEDIFICTS Score Key:  ?70 Need to make dietary changes   40-70 Heart Healthy Diet  ? 40 Therapeutic Level Cholesterol Diet  Flowsheet Row Cardiac Rehab from 11/01/2020 in [facility] Cardiac and Pulmonary Rehab  Picture Your Plate Total Score on Admission 68     Picture Your Plate Scores:  <65 Unhealthy dietary pattern with much room for improvement.  41-50 Dietary pattern unlikely to meet recommendations for good health and room for improvement.  51-60 More healthful dietary pattern, with some room for improvement.   >60 Healthy dietary pattern, although there may be some specific behaviors that could be improved.    Nutrition Goals Re-Evaluation:   Nutrition Goals Discharge (Final Nutrition Goals Re-Evaluation):   Psychosocial: Target Goals: Acknowledge  presence or absence of significant depression and/or stress, maximize coping skills, provide positive support system. Participant is able to verbalize types and ability to use techniques and skills needed for reducing stress and depression.   Education: Stress, Anxiety, and Depression - Group verbal and visual presentation to define topics covered.  Reviews how body is impacted by stress, anxiety, and depression.  Also discusses healthy ways  to reduce stress and to treat/manage anxiety and depression.  Written material given at graduation.   Education: Sleep Hygiene -Provides group verbal and written instruction about how sleep can affect your health.  Define sleep hygiene, discuss sleep cycles and impact of sleep habits. Review good sleep hygiene tips.    Initial Review & Psychosocial Screening:  Initial Psych Review & Screening - 10/26/20 0942      Initial Review   Current issues with History of Depression;Current Psychotropic Meds;Current Depression;Current Sleep Concerns;Current Stress Concerns    Source of Stress Concerns Chronic Illness;Unable to participate in former interests or hobbies;Unable to perform yard/household activities    Comments currently feeling managed on meds, dealing with back issues and SOB since all this has happened      [place]? Yes   wife and children     Barriers   Psychosocial barriers to participate in program The patient should benefit from training in stress management and relaxation.;Psychosocial barriers identified (see note)      Screening Interventions   Interventions To provide support and resources with identified psychosocial needs;Provide feedback about the scores to participant;Encouraged to exercise    Expected Outcomes Short Term goal: Utilizing psychosocial counselor, staff and physician to assist with identification of specific Stressors or current issues interfering with healing process. Setting desired goal for  each stressor or current issue identified.;Long Term Goal: Stressors or current issues are controlled or eliminated.;Short Term goal: Identification and review with participant of any Quality of Life or Depression concerns found by scoring the questionnaire.;Long Term goal: The participant improves quality of Life and PHQ9 Scores as seen by post scores and/or verbalization of changes           Quality of Life Scores:   Quality of Life - 11/01/20 0927      Quality of Life   Select Quality of Life      Quality of Life Scores   Health/Function Pre 18.57 %    Socioeconomic Pre 22.06 %    Psych/Spiritual Pre 24.93 %    Family Pre 28.8 %    GLOBAL Pre 22.1 %          Scores of 19 and below usually indicate a poorer quality of life in these areas.  A difference of  2-3 points is a clinically meaningful difference.  A difference of 2-3 points in the total score of the Quality of Life Index has been associated with significant improvement in overall quality of life, self-image, physical symptoms, and general health in studies assessing change in quality of life.  PHQ-9: Recent Review Flowsheet Data    Depression screen [facility] 2/9 11/01/2020   Decreased Interest 0   Down, Depressed, Hopeless 0   PHQ - 2 Score 0   Altered sleeping 1   Tired, decreased energy 1   Change in appetite 0   Feeling bad or failure about yourself  0   Trouble concentrating 1   Moving slowly or fidgety/restless 0   Suicidal thoughts 0   PHQ-9 Score 3   Difficult doing work/chores Somewhat difficult     Interpretation of Total Score  Total Score Depression Severity:  1-4 = Minimal depression, 5-9 = Mild depression, 10-14 = Moderate depression, 15-19 = Moderately severe depression, 20-27 = Severe depression   Psychosocial Evaluation and Intervention:  Psychosocial Evaluation - 10/26/20 0950      Psychosocial Evaluation & Interventions   Interventions Encouraged to exercise with the program  and follow exercise  prescription;Stress management education    Comments Abbie is coming into rehab after his 3rd stent.  This will be his first time through  as he was previously very active and refereeing soccer.  This time, however, he was not able to bounce back as readily and not able to do soccer all season.  He would like to be ready for the fall season.   He has also been struggling with shortness of breath and would like to get that under better control.  He is already back to work in [employer] on the computer.  He has a great support system with his family. He does have a history of depression but is currently well managed on klonopin.  He also uses a BiPap at night routinuely to sleep.  Overall, he is doing well.    Expected Outcomes Short: Attend rehab to build stamina and improve SOB Long: Continue to stay positive    Continue Psychosocial Services  Follow up required by staff           Psychosocial Re-Evaluation:   Psychosocial Discharge (Final Psychosocial Re-Evaluation):   Vocational Rehabilitation: Provide vocational rehab assistance to qualifying candidates.   Vocational Rehab Evaluation & Intervention:  Vocational Rehab - 10/26/20 0941      Initial Vocational Rehab Evaluation & Intervention   Assessment shows need for Vocational Rehabilitation No           Education: Education Goals: Education classes will be provided on a variety of topics geared toward better understanding of heart health and risk factor modification. Participant will state understanding/return demonstration of topics presented as noted by education test scores.  Learning Barriers/Preferences:  Learning Barriers/Preferences - 10/26/20 0940      Learning Barriers/Preferences   Learning Barriers Sight   glasses   Learning Preferences None           General Cardiac Education Topics:  AED/CPR: - Group verbal and written instruction with the use of models to demonstrate the basic use of the AED with the basic  ABC's of resuscitation.   Anatomy and Cardiac Procedures: - Group verbal and visual presentation and models provide information about basic cardiac anatomy and function. Reviews the testing methods done to diagnose heart disease and the outcomes of the test results. Describes the treatment choices: Medical Management, Angioplasty, or Coronary Bypass Surgery for treating various heart conditions including Myocardial Infarction, Angina, Valve Disease, and Cardiac Arrhythmias.  Written material given at graduation. Flowsheet Row Cardiac Rehab from 11/01/2020 in [facility] Cardiac and Pulmonary Rehab  Education need identified 11/01/20      Medication Safety: - Group verbal and visual instruction to review commonly prescribed medications for heart and lung disease. Reviews the medication, class of the drug, and side effects. Includes the steps to properly store meds and maintain the prescription regimen.  Written material given at graduation.   Intimacy: - Group verbal instruction through game format to discuss how heart and lung disease can affect sexual intimacy. Written material given at graduation..   Know Your Numbers and Heart Failure: - Group verbal and visual instruction to discuss disease risk factors for cardiac and pulmonary disease and treatment options.  Reviews associated critical values for Overweight/Obesity, Hypertension, Cholesterol, and Diabetes.  Discusses basics of heart failure: signs/symptoms and treatments.  Introduces Heart Failure Zone chart for action plan for heart failure.  Written material given at graduation.   Infection Prevention: - Provides verbal and written material to individual with discussion of infection  control including proper hand washing and proper equipment cleaning during exercise session. Flowsheet Row Cardiac Rehab from 11/01/2020 in [facility] Cardiac and Pulmonary Rehab  Date 11/01/20  Educator [facility]  Instruction Review Code 1- Verbalizes Understanding       Falls Prevention: - Provides verbal and written material to individual with discussion of falls prevention and safety. Flowsheet Row Cardiac Rehab from 11/01/2020 in [facility] Cardiac and Pulmonary Rehab  Date 11/01/20  Educator [facility]  Instruction Review Code 1- Verbalizes Understanding      Other: -Provides group and verbal instruction on various topics (see comments)   Knowledge Questionnaire Score:  Knowledge Questionnaire Score - 11/01/20 0928      Knowledge Questionnaire Score   Pre Score 21/26 Education Focus: MI, chest pain, nurtition, exercise           Core Components/Risk Factors/Patient Goals at Admission:  Personal Goals and Risk Factors at Admission - 11/01/20 0929      Core Components/Risk Factors/Patient Goals on Admission    Weight Management Yes;Weight Loss    Intervention Weight Management: Develop a combined nutrition and exercise program designed to reach desired caloric intake, while maintaining appropriate intake of nutrient and fiber, sodium and fats, and appropriate energy expenditure required for the weight goal.;Weight Management: Provide education and appropriate resources to help participant work on and attain dietary goals.    Admit Weight 184 lb 4.8 oz (83.6 kg)    Goal Weight: Short Term 180 lb (81.6 kg)    Goal Weight: Long Term 175 lb (79.4 kg)    Expected Outcomes Short Term: Continue to assess and modify interventions until short term weight is achieved;Long Term: Adherence to nutrition and physical activity/exercise program aimed toward attainment of established weight goal;Weight Loss: Understanding of general recommendations for a balanced deficit meal plan, which promotes 1-2 lb weight loss per week and includes a negative energy balance of [phone] kcal/d;Understanding of distribution of calorie intake throughout the day with the consumption of 4-5 meals/snacks;Understanding recommendations for meals to include 15-35% energy as protein, 25-35%  energy from fat, 35-60% energy from carbohydrates, less than 200mg  of dietary cholesterol, 20-35 gm of total fiber daily    Diabetes Yes    Intervention Provide education about signs/symptoms and action to take for hypo/hyperglycemia.;Provide education about proper nutrition, including hydration, and aerobic/resistive exercise prescription along with prescribed medications to achieve blood glucose in normal ranges: Fasting glucose 65-99 mg/dL    Expected Outcomes Short Term: Participant verbalizes understanding of the signs/symptoms and immediate care of hyper/hypoglycemia, proper foot care and importance of medication, aerobic/resistive exercise and nutrition plan for blood glucose control.;Long Term: Attainment of HbA1C < 7%.    Hypertension Yes    Intervention Provide education on lifestyle modifcations including regular physical activity/exercise, weight management, moderate sodium restriction and increased consumption of fresh fruit, vegetables, and low fat dairy, alcohol moderation, and smoking cessation.;Monitor prescription use compliance.    Expected Outcomes Short Term: Continued assessment and intervention until BP is < 140/78mm HG in hypertensive participants. < 130/43mm HG in hypertensive participants with diabetes, heart failure or chronic kidney disease.;Long Term: Maintenance of blood pressure at goal levels.    Lipids Yes    Intervention Provide education and support for participant on nutrition & aerobic/resistive exercise along with prescribed medications to achieve LDL 70mg , HDL >40mg .    Expected Outcomes Short Term: Participant states understanding of desired cholesterol values and is compliant with medications prescribed. Participant is following exercise prescription and nutrition guidelines.;Long Term: Cholesterol controlled  with medications as prescribed, with individualized exercise RX and with personalized nutrition plan. Value goals: LDL < 70mg , HDL > 40 mg.            Education:Diabetes - Individual verbal and written instruction to review signs/symptoms of diabetes, desired ranges of glucose level fasting, after meals and with exercise. Acknowledge that pre and post exercise glucose checks will be done for 3 sessions at entry of program. [place] from 11/01/2020 in [facility]  Date 11/01/20  Educator [facility]  Instruction Review Code 1- Verbalizes Understanding      Core Components/Risk Factors/Patient Goals Review:    Core Components/Risk Factors/Patient Goals at Discharge (Final Review):    ITP Comments:  ITP Comments    [place] Name 10/26/20 0958 11/01/20 0919         ITP Comments Completed virtual orientation today.  EP evaluation is scheduled for Monday 6/6 at 8am.  Documentation for diagnosis can be found in [facility] encounter 10/06/20. Completed 6MWT and gym orientation. Initial ITP created and sent for review to Dr. Emily Filbert, Medical Director.             Comments: Initial ITP

## 2020-11-01 NOTE — Patient Instructions (Signed)
Patient Instructions  Patient Details  Name: Frank Wong MRN: [id number] Date of Birth: [birth year] Referring Provider:  [name], MD  Below are your personal goals for exercise, nutrition, and risk factors. Our goal is to help you stay on track towards obtaining and maintaining these goals. We will be discussing your progress on these goals with you throughout the program.  Initial Exercise Prescription:  Initial Exercise Prescription - 11/01/20 0900      Date of Initial Exercise RX and Referring Provider   Date 11/01/20    Referring Provider Paraschos, Alexander MD      Treadmill   MPH 2.7    Grade 1    Minutes 15    METs 3.44      Recumbant Bike   Level 3    RPM 50    Watts 47    Minutes 15    METs 3.5      REL-XR   Level 3    Speed 50    Minutes 15    METs 3.5      Track   Laps 40    Minutes 15    METs 3.2      Prescription Details   Frequency (times per week) 3    Duration Progress to 30 minutes of continuous aerobic without signs/symptoms of physical distress      Intensity   THRR 40-80% of Max Heartrate 108-141    Ratings of Perceived Exertion 11-13    Perceived Dyspnea 0-4      Progression   Progression Continue to progress workloads to maintain intensity without signs/symptoms of physical distress.      Resistance Training   Training Prescription Yes    Weight 4 lb    Reps 10-15           Exercise Goals: Frequency: Be able to perform aerobic exercise two to three times per week in program working toward 2-5 days per week of home exercise.  Intensity: Work with a perceived exertion of 11 (fairly light) - 15 (hard) while following your exercise prescription.  We will make changes to your prescription with you as you progress through the program.   Duration: Be able to do 30 to 45 minutes of continuous aerobic exercise in addition to a 5 minute warm-up and a 5 minute cool-down routine.   Nutrition Goals: Your personal nutrition  goals will be established when you do your nutrition analysis with the dietician.  The following are general nutrition guidelines to follow: Cholesterol < 200mg /day Sodium < 1500mg /day Fiber: Men over 50 yrs - 30 grams per day  Personal Goals:  Personal Goals and Risk Factors at Admission - 11/01/20 0929      Core Components/Risk Factors/Patient Goals on Admission    Weight Management Yes;Weight Loss    Intervention Weight Management: Develop a combined nutrition and exercise program designed to reach desired caloric intake, while maintaining appropriate intake of nutrient and fiber, sodium and fats, and appropriate energy expenditure required for the weight goal.;Weight Management: Provide education and appropriate resources to help participant work on and attain dietary goals.    Admit Weight 184 lb 4.8 oz (83.6 kg)    Goal Weight: Short Term 180 lb (81.6 kg)    Goal Weight: Long Term 175 lb (79.4 kg)    Expected Outcomes Short Term: Continue to assess and modify interventions until short term weight is achieved;Long Term: Adherence to nutrition and physical activity/exercise program aimed toward attainment of established weight goal;Weight  Loss: Understanding of general recommendations for a balanced deficit meal plan, which promotes 1-2 lb weight loss per week and includes a negative energy balance of [phone]/d;Understanding of distribution of calorie intake throughout the day with the consumption of 4-5 meals/snacks;Understanding recommendations for meals to include 15-35% energy as protein, 25-35% energy from fat, 35-60% energy from carbohydrates, less than 200mg  of dietary cholesterol, 20-35 gm of total fiber daily    Diabetes Yes    Intervention Provide education about signs/symptoms and action to take for hypo/hyperglycemia.;Provide education about proper nutrition, including hydration, and aerobic/resistive exercise prescription along with prescribed medications to achieve blood  glucose in normal ranges: Fasting glucose 65-99 mg/dL    Expected Outcomes Short Term: Participant verbalizes understanding of the signs/symptoms and immediate care of hyper/hypoglycemia, proper foot care and importance of medication, aerobic/resistive exercise and nutrition plan for blood glucose control.;Long Term: Attainment of HbA1C < 7%.    Hypertension Yes    Intervention Provide education on lifestyle modifcations including regular physical activity/exercise, weight management, moderate sodium restriction and increased consumption of fresh fruit, vegetables, and low fat dairy, alcohol moderation, and smoking cessation.;Monitor prescription use compliance.    Expected Outcomes Short Term: Continued assessment and intervention until BP is < 140/51mm HG in hypertensive participants. < 130/75mm HG in hypertensive participants with diabetes, heart failure or chronic kidney disease.;Long Term: Maintenance of blood pressure at goal levels.    Lipids Yes    Intervention Provide education and support for participant on nutrition & aerobic/resistive exercise along with prescribed medications to achieve LDL 70mg , HDL >40mg .    Expected Outcomes Short Term: Participant states understanding of desired cholesterol values and is compliant with medications prescribed. Participant is following exercise prescription and nutrition guidelines.;Long Term: Cholesterol controlled with medications as prescribed, with individualized exercise RX and with personalized nutrition plan. Value goals: LDL < 70mg , HDL > 40 mg.           Tobacco Use Initial Evaluation: Social History   Tobacco Use  Smoking Status Former Smoker  Smokeless Tobacco Former Systems developer    Exercise Goals and Review:  Exercise Goals    [place] Name 11/01/20 [phone]             Exercise Goals   Increase Physical Activity Yes       Intervention Provide advice, education, support and counseling about physical activity/exercise needs.;Develop an  individualized exercise prescription for aerobic and resistive training based on initial evaluation findings, risk stratification, comorbidities and participant's personal goals.       Expected Outcomes Long Term: Add in home exercise to make exercise part of routine and to increase amount of physical activity.;Short Term: Attend rehab on a regular basis to increase amount of physical activity.;Long Term: Exercising regularly at least 3-5 days a week.       Increase Strength and Stamina Yes       Intervention Provide advice, education, support and counseling about physical activity/exercise needs.;Develop an individualized exercise prescription for aerobic and resistive training based on initial evaluation findings, risk stratification, comorbidities and participant's personal goals.       Expected Outcomes Short Term: Increase workloads from initial exercise prescription for resistance, speed, and METs.;Short Term: Perform resistance training exercises routinely during rehab and add in resistance training at home;Long Term: Improve cardiorespiratory fitness, muscular endurance and strength as measured by increased METs and functional capacity (6MWT)       Able to understand and use rate of perceived exertion (RPE) scale Yes  Intervention Provide education and explanation on how to use RPE scale       Expected Outcomes Long Term:  Able to use RPE to guide intensity level when exercising independently;Short Term: Able to use RPE daily in rehab to express subjective intensity level       Able to understand and use Dyspnea scale Yes       Intervention Provide education and explanation on how to use Dyspnea scale       Expected Outcomes Short Term: Able to use Dyspnea scale daily in rehab to express subjective sense of shortness of breath during exertion;Long Term: Able to use Dyspnea scale to guide intensity level when exercising independently       Knowledge and understanding of Target Heart Rate Range  (THRR) Yes       Intervention Provide education and explanation of THRR including how the numbers were predicted and where they are located for reference       Expected Outcomes Short Term: Able to state/look up THRR;Short Term: Able to use daily as guideline for intensity in rehab;Long Term: Able to use THRR to govern intensity when exercising independently       Able to check pulse independently Yes       Intervention Provide education and demonstration on how to check pulse in carotid and radial arteries.;Review the importance of being able to check your own pulse for safety during independent exercise       Expected Outcomes Short Term: Able to explain why pulse checking is important during independent exercise;Long Term: Able to check pulse independently and accurately       Understanding of Exercise Prescription Yes       Intervention Provide education, explanation, and written materials on patient's individual exercise prescription       Expected Outcomes Short Term: Able to explain program exercise prescription;Long Term: Able to explain home exercise prescription to exercise independently              Copy of goals given to participant.

## 2020-11-03 ENCOUNTER — Other Ambulatory Visit: Payer: Self-pay

## 2020-11-03 DIAGNOSIS — Z955 Presence of coronary angioplasty implant and graft: Secondary | ICD-10-CM | POA: Diagnosis not present

## 2020-11-03 LAB — GLUCOSE, CAPILLARY
Glucose-Capillary: 172 mg/dL — ABNORMAL HIGH (ref 70–99)
Glucose-Capillary: 144 mg/dL — ABNORMAL HIGH (ref 70–99)

## 2020-11-03 NOTE — Progress Notes (Signed)
Daily Session Note  Patient Details  Name: Frank Wong MRN: [id number] Date of Birth: February 14, 1956 Referring Provider:   Flowsheet Row Cardiac Rehab from 11/01/2020 in [facility] Cardiac and Pul[facility] Rehab  Referring Provider [name] MD      Encounter Date: 11/03/2020  Check In:  Session Check In - 11/03/20 0734      Check-In   Supervising physician immediately available to respond to emergencies See telemetry face sheet for immediately available ER MD    Location [facility]-Cardiac & Pulmonary Rehab    Staff Present Birdie Sons, MPA, Elveria Rising, BA, ACSM CEP, Exercise Physiologist;Joseph Tessie Fass RCP,RRT,BSRT    Virtual Visit No    Medication changes reported     No    Fall or balance concerns reported    No    Warm-up and Cool-down Performed on first and last piece of equipment    Resistance Training Performed Yes    VAD Patient? No    PAD/SET Patient? No      Pain Assessment   Currently in Pain? No/denies              Social History   Tobacco Use  Smoking Status Former Smoker  Smokeless Tobacco Former Environmental health practitioner Met:  Independence with exercise equipment Exercise tolerated well No report of cardiac concerns or symptoms Strength training completed today  Goals Unmet:  Not Applicable  Comments: First full day of exercise!  Patient was oriented to gym and equipment including functions, settings, policies, and procedures.  Patient's individual exercise prescription and treatment plan were reviewed.  All starting workloads were established based on the results of the 6 minute walk test done at initial orientation visit.  The plan for exercise progression was also introduced and progression will be customized based on patient's performance and goals.     Dr. Emily Filbert is Medical Director for [place].  Dr. Ottie Glazier is Medical Director for [facility] Pul[facility] Rehabilitation.

## 2020-11-05 ENCOUNTER — Other Ambulatory Visit: Payer: Self-pay

## 2020-11-05 DIAGNOSIS — Z955 Presence of coronary angioplasty implant and graft: Secondary | ICD-10-CM | POA: Diagnosis not present

## 2020-11-05 LAB — GLUCOSE, CAPILLARY
Glucose-Capillary: 141 mg/dL — ABNORMAL HIGH (ref 70–99)
Glucose-Capillary: 89 mg/dL (ref 70–99)

## 2020-11-05 NOTE — Progress Notes (Signed)
Daily Session Note  Patient Details  Name: Frank Wong MRN: [id number] Date of Birth: 02-13-1956 Referring Provider:   Flowsheet Row Cardiac Rehab from 11/01/2020 in [facility] Rehab  Referring Provider [name] MD       Encounter Date: 11/05/2020  Check In:  Session Check In - 11/05/20 0758       Check-In   Supervising physician immediately available to respond to emergencies See telemetry face sheet for immediately available ER MD    Location [facility]-Cardiac & Pulmonary Rehab    Staff Present Justin Mend RCP,RRT,BSRT;Vida Rigger RN, BSN;Jessica Luan Pulling, MA, RCEP, CCRP, CCET    Virtual Visit No    Medication changes reported     No    Fall or balance concerns reported    No    Warm-up and Cool-down Performed on first and last piece of equipment    Resistance Training Performed Yes    VAD Patient? No    PAD/SET Patient? No      Pain Assessment   Currently in Pain? No/denies                Social History   Tobacco Use  Smoking Status Former   Pack years: 0.00  Smokeless Tobacco Former    Goals Met:  Independence with exercise equipment Exercise tolerated well No report of cardiac concerns or symptoms Strength training completed today  Goals Unmet:  Not Applicable  Comments: Pt able to follow exercise prescription today without complaint.  Will continue to monitor for progression.   Dr. Emily Filbert is Medical Director for [place].  Dr. Ottie Glazier is Medical Director for [facility].

## 2020-11-08 ENCOUNTER — Encounter: Payer: 59 | Admitting: *Deleted

## 2020-11-08 ENCOUNTER — Other Ambulatory Visit: Payer: Self-pay

## 2020-11-08 DIAGNOSIS — Z955 Presence of coronary angioplasty implant and graft: Secondary | ICD-10-CM | POA: Diagnosis not present

## 2020-11-08 LAB — GLUCOSE, CAPILLARY
Glucose-Capillary: 152 mg/dL — ABNORMAL HIGH (ref 70–99)
Glucose-Capillary: 141 mg/dL — ABNORMAL HIGH (ref 70–99)

## 2020-11-08 NOTE — Progress Notes (Signed)
Daily Session Note  Patient Details  Name: Frank Wong MRN: [id number] Date of Birth: May 04, 1956 Referring Provider:   Flowsheet Row Cardiac Rehab from 11/01/2020 in [facility] Cardiac and Pulmonary Rehab  Referring Provider [name] MD       Encounter Date: 11/08/2020  Check In:  Session Check In - 11/08/20 0903       Check-In   Supervising physician immediately available to respond to emergencies See telemetry face sheet for immediately available ER MD    Location [facility]-Cardiac & Pulmonary Rehab    Staff Present Heath Lark, RN, BSN, CCRP;Joseph Hood RCP,RRT,BSRT;Kelly Whitehouse, [place], ACSM CEP, Exercise Physiologist    Virtual Visit No    Medication changes reported     No    Fall or balance concerns reported    No    Warm-up and Cool-down Performed on first and last piece of equipment    Resistance Training Performed Yes    VAD Patient? No    PAD/SET Patient? No      Pain Assessment   Currently in Pain? No/denies                Social History   Tobacco Use  Smoking Status Former   Pack years: 0.00  Smokeless Tobacco Former    Goals Met:  Independence with exercise equipment Exercise tolerated well No report of cardiac concerns or symptoms  Goals Unmet:  Not Applicable  Comments: Pt able to follow exercise prescription today without complaint.  Will continue to monitor for progression.    Dr. Emily Filbert is Medical Director for [place].  Dr. Ottie Glazier is Medical Director for [facility] Pulmonary Rehabilitation.

## 2020-11-10 ENCOUNTER — Encounter: Payer: Self-pay | Admitting: *Deleted

## 2020-11-10 ENCOUNTER — Other Ambulatory Visit: Payer: Self-pay

## 2020-11-10 DIAGNOSIS — Z955 Presence of coronary angioplasty implant and graft: Secondary | ICD-10-CM | POA: Diagnosis not present

## 2020-11-10 NOTE — Progress Notes (Signed)
Cardiac Individual Treatment Plan  Patient Details  Name: Frank Wong MRN: [id number] Date of Birth: [birth year] Referring Provider:   Flowsheet Row Cardiac Rehab from 11/01/2020 in [facility] Cardiac and Pulmonary Rehab  Referring Provider [name] MD       Initial Encounter Date:  Flowsheet Row Cardiac Rehab from 11/01/2020 in [facility] Cardiac and Pulmonary Rehab  Date 11/01/20       Visit Diagnosis: Status post coronary artery stent placement  Patient's Home Medications on Admission:  Current Outpatient Medications:    aspirin EC 81 MG tablet, Take 81 mg by mouth in the morning., Disp: , Rfl:    atorvastatin (LIPITOR) 80 MG tablet, Take 80 mg by mouth in the morning., Disp: , Rfl:    B Complex-C (B-COMPLEX WITH VITAMIN C) tablet, Take 1 tablet by mouth in the morning., Disp: , Rfl:    benazepril (LOTENSIN) 20 MG tablet, Take 20 mg by mouth in the morning., Disp: , Rfl:    CINNAMON PO, Take 1,000 mg by mouth in the morning and at bedtime., Disp: , Rfl:    clonazePAM (KLONOPIN) 2 MG tablet, Take 1 mg by mouth at bedtime as needed (sleep/ anxiety)., Disp: , Rfl:    clopidogrel (PLAVIX) 75 MG tablet, Take 75 mg by mouth in the morning., Disp: , Rfl:    dicyclomine (BENTYL) 10 MG capsule, Take 10 mg by mouth 4 (four) times daily as needed for spasms., Disp: , Rfl:    EPINEPHrine 0.3 mg/0.3 mL IJ SOAJ injection, Inject 0.3 mg into the muscle as needed for anaphylaxis., Disp: , Rfl:    ferrous sulfate 325 (65 FE) MG tablet, Take 325 mg by mouth in the morning., Disp: , Rfl:    fexofenadine (ALLEGRA) 180 MG tablet, Take 180 mg by mouth in the morning., Disp: , Rfl:    gabapentin (NEURONTIN) 600 MG tablet, Take 300 mg by mouth at bedtime., Disp: , Rfl:    GAVILYTE-G 236 g solution, Take 4,000 mLs by mouth as directed., Disp: , Rfl:    glipiZIDE (GLUCOTROL) 10 MG tablet, Take 10 mg by mouth every morning., Disp: , Rfl:    hyoscyamine (LEVSIN SL) 0.125 MG SL tablet, Place 0.125 mg under  the tongue every 6 (six) hours as needed (abdominal spasms)., Disp: , Rfl:    JARDIANCE 25 MG TABS tablet, Take 25 mg by mouth in the morning., Disp: , Rfl:    levocetirizine (XYZAL) 5 MG tablet, Take 5 mg by mouth every evening., Disp: , Rfl:    magnesium oxide (MAG-OX) 400 MG tablet, Take 400 mg by mouth in the morning., Disp: , Rfl:    melatonin 5 MG TABS, Take 5 mg by mouth at bedtime as needed (sleep)., Disp: , Rfl:    metFORMIN (GLUCOPHAGE) 1000 MG tablet, Take 1,000 mg by mouth 2 (two) times daily with a meal. , Disp: , Rfl:    montelukast (SINGULAIR) 10 MG tablet, Take 10 mg by mouth at bedtime., Disp: , Rfl:    Multiple Vitamin (MULTIVITAMIN WITH MINERALS) TABS tablet, Take 1 tablet by mouth in the morning., Disp: , Rfl:    pantoprazole (PROTONIX) 40 MG tablet, Take 40 mg by mouth at bedtime., Disp: , Rfl:    Peppermint Oil (IBGARD PO), Take 2 tablets by mouth in the morning and at bedtime., Disp: , Rfl:    psyllium (METAMUCIL SMOOTH TEXTURE) 28 % packet, Take 1 packet by mouth in the morning., Disp: , Rfl:    sildenafil (REVATIO)  20 MG tablet, Take 10 mg by mouth in the morning and at bedtime., Disp: , Rfl:    Testosterone 20.25 MG/ACT (1.62%) GEL, Apply 2 Pump topically in the morning. Applied to shoulders., Disp: , Rfl:    triamcinolone cream (KENALOG) 0.1 %, Apply 1 application topically 2 (two) times daily as needed (skin irritation/itching)., Disp: , Rfl:    umeclidinium-vilanterol (ANORO ELLIPTA) 62.5-25 MCG/INH AEPB, Inhale 1 puff into the lungs in the morning., Disp: , Rfl:   Past Medical History: Past Medical History:  Diagnosis Date   Asthma    CAD (coronary artery disease)    Cardiac arrest ([place])    Diabetes mellitus without complication ([place])    GERD (gastroesophageal reflux disease)    Heart attack ([place])    Hypertension    Shoulder problem 2010   right   Sleep apnea     Tobacco Use: Social History   Tobacco Use  Smoking Status Former   Pack years: 0.00   Smokeless Tobacco Former    Labs: Recent Chemical engineer   There is no flowsheet data to display.      Exercise Target Goals: Exercise Program Goal: Individual exercise prescription set using results from initial 6 min walk test and THRR while considering  patient's activity barriers and safety.   Exercise Prescription Goal: Initial exercise prescription builds to 30-45 minutes a day of aerobic activity, 2-3 days per week.  Home exercise guidelines will be given to patient during program as part of exercise prescription that the participant will acknowledge.   Education: Aerobic Exercise: - Group verbal and visual presentation on the components of exercise prescription. Introduces F.I.T.T principle from ACSM for exercise prescriptions.  Reviews F.I.T.T. principles of aerobic exercise including progression. Written material given at graduation. Flowsheet Row Cardiac Rehab from 11/10/2020 in [facility] Cardiac and Pulmonary Rehab  Education need identified 11/01/20       Education: Resistance Exercise: - Group verbal and visual presentation on the components of exercise prescription. Introduces F.I.T.T principle from ACSM for exercise prescriptions  Reviews F.I.T.T. principles of resistance exercise including progression. Written material given at graduation.    Education: Exercise & Equipment Safety: - Individual verbal instruction and demonstration of equipment use and safety with use of the equipment. Flowsheet Row Cardiac Rehab from 11/10/2020 in [facility] Cardiac and Pulmonary Rehab  Date 11/01/20  Educator [facility]  Instruction Review Code 1- Verbalizes Understanding       Education: Exercise Physiology & General Exercise Guidelines: - Group verbal and written instruction with models to review the exercise physiology of the cardiovascular system and associated critical values. Provides general exercise guidelines with specific guidelines to those with heart or lung disease.   Flowsheet Row Cardiac Rehab from 11/10/2020 in [facility] Cardiac and Pulmonary Rehab  Date 11/10/20  Educator AS  Instruction Review Code 1- Verbalizes Understanding       Education: Flexibility, Balance, Mind/Body Relaxation: - Group verbal and visual presentation with interactive activity on the components of exercise prescription. Introduces F.I.T.T principle from ACSM for exercise prescriptions. Reviews F.I.T.T. principles of flexibility and balance exercise training including progression. Also discusses the mind body connection.  Reviews various relaxation techniques to help reduce and manage stress (i.e. Deep breathing, progressive muscle relaxation, and visualization). Balance handout provided to take home. Written material given at graduation.   Activity Barriers & Risk Stratification:  Activity Barriers & Cardiac Risk Stratification - 11/01/20 0920       Activity Barriers & Cardiac Risk Stratification  Activity Barriers Muscular Weakness;Deconditioning;Joint Problems;Other (comment);Back Problems    Comments hx of sports injuries, played football and refereed soccer    Cardiac Risk Stratification Moderate             6 Minute Walk:  6 Minute Walk     Row Name 11/01/20 0920         6 Minute Walk   Phase Initial     Distance 1455 feet     Walk Time 6 minutes     # of Rest Breaks 0     MPH 2.76     METS 3.78     RPE 9     Perceived Dyspnea  1     VO2 Peak 13.22     Symptoms No     Resting HR 74 bpm     Resting BP 134/72     Resting Oxygen Saturation  98 %     Exercise Oxygen Saturation  during 6 min walk 98 %     Max Ex. HR 110 bpm     Max Ex. BP 136/70     2 Minute Post BP 114/66              Oxygen Initial Assessment:   Oxygen Re-Evaluation:   Oxygen Discharge (Final Oxygen Re-Evaluation):   Initial Exercise Prescription:  Initial Exercise Prescription - 11/01/20 0900       Date of Initial Exercise RX and Referring Provider   Date 11/01/20     Referring Provider Paraschos, Alexander MD      Treadmill   MPH 2.7    Grade 1    Minutes 15    METs 3.44      Recumbant Bike   Level 3    RPM 50    Watts 47    Minutes 15    METs 3.5      REL-XR   Level 3    Speed 50    Minutes 15    METs 3.5      Track   Laps 40    Minutes 15    METs 3.2      Prescription Details   Frequency (times per week) 3    Duration Progress to 30 minutes of continuous aerobic without signs/symptoms of physical distress      Intensity   THRR 40-80% of Max Heartrate 108-141    Ratings of Perceived Exertion 11-13    Perceived Dyspnea 0-4      Progression   Progression Continue to progress workloads to maintain intensity without signs/symptoms of physical distress.      Resistance Training   Training Prescription Yes    Weight 4 lb    Reps 10-15             Perform Capillary Blood Glucose checks as needed.  Exercise Prescription Changes:   Exercise Prescription Changes     Row Name 11/01/20 0900 11/10/20 0700           Response to Exercise   Blood Pressure (Admit) 134/72 114/70      Blood Pressure (Exercise) 136/70 132/82      Blood Pressure (Exit) 114/66 98/58      Heart Rate (Admit) 74 bpm 75 bpm      Heart Rate (Exercise) 110 bpm 128 bpm      Heart Rate (Exit) 87 bpm 104 bpm      Oxygen Saturation (Admit) 98 % --      Oxygen Saturation (Exercise) 98 % --  Rating of Perceived Exertion (Exercise) 9 12      Perceived Dyspnea (Exercise) 1 --      Symptoms SOB --      Comments walk test results --      Duration -- Progress to 30 minutes of  aerobic without signs/symptoms of physical distress      Intensity -- THRR unchanged             Progression      Progression -- Continue to progress workloads to maintain intensity without signs/symptoms of physical distress.      Average METs -- 4.67             Resistance Training      Training Prescription -- Yes      Weight -- 5 lb      Reps -- 10-15               Exercise Comments:   Exercise Comments     Row Name 11/03/20 0735           Exercise Comments First full day of exercise!  Patient was oriented to gym and equipment including functions, settings, policies, and procedures.  Patient's individual exercise prescription and treatment plan were reviewed.  All starting workloads were established based on the results of the 6 minute walk test done at initial orientation visit.  The plan for exercise progression was also introduced and progression will be customized based on patient's performance and goals.                Exercise Goals and Review:   Exercise Goals     Row Name 11/01/20 0926             Exercise Goals   Increase Physical Activity Yes       Intervention Provide advice, education, support and counseling about physical activity/exercise needs.;Develop an individualized exercise prescription for aerobic and resistive training based on initial evaluation findings, risk stratification, comorbidities and participant's personal goals.       Expected Outcomes Long Term: Add in home exercise to make exercise part of routine and to increase amount of physical activity.;Short Term: Attend rehab on a regular basis to increase amount of physical activity.;Long Term: Exercising regularly at least 3-5 days a week.       Increase Strength and Stamina Yes       Intervention Provide advice, education, support and counseling about physical activity/exercise needs.;Develop an individualized exercise prescription for aerobic and resistive training based on initial evaluation findings, risk stratification, comorbidities and participant's personal goals.       Expected Outcomes Short Term: Increase workloads from initial exercise prescription for resistance, speed, and METs.;Short Term: Perform resistance training exercises routinely during rehab and add in resistance training at home;Long Term: Improve cardiorespiratory fitness, muscular  endurance and strength as measured by increased METs and functional capacity (6MWT)       Able to understand and use rate of perceived exertion (RPE) scale Yes       Intervention Provide education and explanation on how to use RPE scale       Expected Outcomes Long Term:  Able to use RPE to guide intensity level when exercising independently;Short Term: Able to use RPE daily in rehab to express subjective intensity level       Able to understand and use Dyspnea scale Yes       Intervention Provide education and explanation on how to use Dyspnea scale  Expected Outcomes Short Term: Able to use Dyspnea scale daily in rehab to express subjective sense of shortness of breath during exertion;Long Term: Able to use Dyspnea scale to guide intensity level when exercising independently       Knowledge and understanding of Target Heart Rate Range (THRR) Yes       Intervention Provide education and explanation of THRR including how the numbers were predicted and where they are located for reference       Expected Outcomes Short Term: Able to state/look up THRR;Short Term: Able to use daily as guideline for intensity in rehab;Long Term: Able to use THRR to govern intensity when exercising independently       Able to check pulse independently Yes       Intervention Provide education and demonstration on how to check pulse in carotid and radial arteries.;Review the importance of being able to check your own pulse for safety during independent exercise       Expected Outcomes Short Term: Able to explain why pulse checking is important during independent exercise;Long Term: Able to check pulse independently and accurately       Understanding of Exercise Prescription Yes       Intervention Provide education, explanation, and written materials on patient's individual exercise prescription       Expected Outcomes Short Term: Able to explain program exercise prescription;Long Term: Able to explain home exercise  prescription to exercise independently                Exercise Goals Re-Evaluation :  Exercise Goals Re-Evaluation     Row Name 11/03/20 0735 11/10/20 0756           Exercise Goal Re-Evaluation   Exercise Goals Review Able to understand and use rate of perceived exertion (RPE) scale;Knowledge and understanding of Target Heart Rate Range (THRR);Increase Physical Activity;Understanding of Exercise Prescription;Increase Strength and Stamina;Able to understand and use Dyspnea scale;Able to check pulse independently Increase Physical Activity;Increase Strength and Stamina      Comments Reviewed RPE and dyspnea scales, THR and program prescription with pt today.  Pt voiced understanding and was given a copy of goals to take home. Frank Wong is doing well so far with exercise.  He has increased to 5 lb for strength training.  Staff will monitor progress.      Expected Outcomes Short: Use RPE daily to regulate intensity. Long: Follow program prescription in THR. Short:  attend consistently Long:  improve overall stamina               Discharge Exercise Prescription (Final Exercise Prescription Changes):  Exercise Prescription Changes - 11/10/20 0700       Response to Exercise   Blood Pressure (Admit) 114/70    Blood Pressure (Exercise) 132/82    Blood Pressure (Exit) 98/58    Heart Rate (Admit) 75 bpm    Heart Rate (Exercise) 128 bpm    Heart Rate (Exit) 104 bpm    Rating of Perceived Exertion (Exercise) 12    Duration Progress to 30 minutes of  aerobic without signs/symptoms of physical distress    Intensity THRR unchanged      Progression   Progression Continue to progress workloads to maintain intensity without signs/symptoms of physical distress.    Average METs 4.67      Resistance Training   Training Prescription Yes    Weight 5 lb    Reps 10-15             Nutrition:  Target Goals: Understanding of nutrition guidelines, daily intake of sodium 1500mg , cholesterol  200mg , calories 30% from fat and 7% or less from saturated fats, daily to have 5 or more servings of fruits and vegetables.  Education: All About Nutrition: -Group instruction provided by verbal, written material, interactive activities, discussions, models, and posters to present general guidelines for heart healthy nutrition including fat, fiber, MyPlate, the role of sodium in heart healthy nutrition, utilization of the nutrition label, and utilization of this knowledge for meal planning. Follow up email sent as well. Written material given at graduation. Flowsheet Row Cardiac Rehab from 11/10/2020 in [facility] Cardiac and Pulmonary Rehab  Education need identified 11/01/20       Biometrics:  Pre Biometrics - 11/01/20 0926       Pre Biometrics   Height 5' 8.8" (1.748 m)    Weight 184 lb 4.8 oz (83.6 kg)    BMI (Calculated) 27.36    Single Leg Stand 30 seconds              Nutrition Therapy Plan and Nutrition Goals:  Nutrition Therapy & Goals - 10/26/20 0956       Intervention Plan   Intervention Prescribe, educate and counsel regarding individualized specific dietary modifications aiming towards targeted core components such as weight, hypertension, lipid management, diabetes, heart failure and other comorbidities.    Expected Outcomes Short Term Goal: Understand basic principles of dietary content, such as calories, fat, sodium, cholesterol and nutrients.;Short Term Goal: A plan has been developed with personal nutrition goals set during dietitian appointment.;Long Term Goal: Adherence to prescribed nutrition plan.             Nutrition Assessments:  MEDIFICTS Score Key: ?70 Need to make dietary changes  40-70 Heart Healthy Diet ? 40 Therapeutic Level Cholesterol Diet  Flowsheet Row Cardiac Rehab from 11/01/2020 in [facility] Cardiac and Pulmonary Rehab  Picture Your Plate Total Score on Admission 68      Picture Your Plate Scores: <38 Unhealthy dietary pattern with much  room for improvement. 41-50 Dietary pattern unlikely to meet recommendations for good health and room for improvement. 51-60 More healthful dietary pattern, with some room for improvement.  >60 Healthy dietary pattern, although there may be some specific behaviors that could be improved.    Nutrition Goals Re-Evaluation:   Nutrition Goals Discharge (Final Nutrition Goals Re-Evaluation):   Psychosocial: Target Goals: Acknowledge presence or absence of significant depression and/or stress, maximize coping skills, provide positive support system. Participant is able to verbalize types and ability to use techniques and skills needed for reducing stress and depression.   Education: Stress, Anxiety, and Depression - Group verbal and visual presentation to define topics covered.  Reviews how body is impacted by stress, anxiety, and depression.  Also discusses healthy ways to reduce stress and to treat/manage anxiety and depression.  Written material given at graduation.   Education: Sleep Hygiene -Provides group verbal and written instruction about how sleep can affect your health.  Define sleep hygiene, discuss sleep cycles and impact of sleep habits. Review good sleep hygiene tips.    Initial Review & Psychosocial Screening:  Initial Psych Review & Screening - 10/26/20 0942       Initial Review   Current issues with History of Depression;Current Psychotropic Meds;Current Depression;Current Sleep Concerns;Current Stress Concerns    Source of Stress Concerns Chronic Illness;Unable to participate in former interests or hobbies;Unable to perform yard/household activities    Comments currently feeling managed on meds, dealing with  back issues and SOB since all this has happened      [place]? Yes   wife and children     Barriers   Psychosocial barriers to participate in program The patient should benefit from training in stress management and  relaxation.;Psychosocial barriers identified (see note)      Screening Interventions   Interventions To provide support and resources with identified psychosocial needs;Provide feedback about the scores to participant;Encouraged to exercise    Expected Outcomes Short Term goal: Utilizing psychosocial counselor, staff and physician to assist with identification of specific Stressors or current issues interfering with healing process. Setting desired goal for each stressor or current issue identified.;Long Term Goal: Stressors or current issues are controlled or eliminated.;Short Term goal: Identification and review with participant of any Quality of Life or Depression concerns found by scoring the questionnaire.;Long Term goal: The participant improves quality of Life and PHQ9 Scores as seen by post scores and/or verbalization of changes             Quality of Life Scores:   Quality of Life - 11/01/20 0927       Quality of Life   Select Quality of Life      Quality of Life Scores   Health/Function Pre 18.57 %    Socioeconomic Pre 22.06 %    Psych/Spiritual Pre 24.93 %    Family Pre 28.8 %    GLOBAL Pre 22.1 %            Scores of 19 and below usually indicate a poorer quality of life in these areas.  A difference of  2-3 points is a clinically meaningful difference.  A difference of 2-3 points in the total score of the Quality of Life Index has been associated with significant improvement in overall quality of life, self-image, physical symptoms, and general health in studies assessing change in quality of life.  PHQ-9: Recent Review Flowsheet Data     Depression screen [facility] 2/9 11/01/2020   Decreased Interest 0   Down, Depressed, Hopeless 0   PHQ - 2 Score 0   Altered sleeping 1   Tired, decreased energy 1   Change in appetite 0   Feeling bad or failure about yourself  0   Trouble concentrating 1   Moving slowly or fidgety/restless 0   Suicidal thoughts 0   PHQ-9 Score 3    Difficult doing work/chores Somewhat difficult      Interpretation of Total Score  Total Score Depression Severity:  1-4 = Minimal depression, 5-9 = Mild depression, 10-14 = Moderate depression, 15-19 = Moderately severe depression, 20-27 = Severe depression   Psychosocial Evaluation and Intervention:  Psychosocial Evaluation - 10/26/20 0950       Psychosocial Evaluation & Interventions   Interventions Encouraged to exercise with the program and follow exercise prescription;Stress management education    Comments Frank Wong is coming into rehab after his 3rd stent.  This will be his first time through  as he was previously very active and refereeing soccer.  This time, however, he was not able to bounce back as readily and not able to do soccer all season.  He would like to be ready for the fall season.   He has also been struggling with shortness of breath and would like to get that under better control.  He is already back to work in [employer] on the computer.  He has a great support system with his family.  He does have a history of depression but is currently well managed on klonopin.  He also uses a BiPap at night routinuely to sleep.  Overall, he is doing well.    Expected Outcomes Short: Attend rehab to build stamina and improve SOB Long: Continue to stay positive    Continue Psychosocial Services  Follow up required by staff             Psychosocial Re-Evaluation:   Psychosocial Discharge (Final Psychosocial Re-Evaluation):   Vocational Rehabilitation: Provide vocational rehab assistance to qualifying candidates.   Vocational Rehab Evaluation & Intervention:  Vocational Rehab - 10/26/20 0941       Initial Vocational Rehab Evaluation & Intervention   Assessment shows need for Vocational Rehabilitation No             Education: Education Goals: Education classes will be provided on a variety of topics geared toward better understanding of heart health and risk factor  modification. Participant will state understanding/return demonstration of topics presented as noted by education test scores.  Learning Barriers/Preferences:  Learning Barriers/Preferences - 10/26/20 0940       Learning Barriers/Preferences   Learning Barriers Sight   glasses   Learning Preferences None             General Cardiac Education Topics:  AED/CPR: - Group verbal and written instruction with the use of models to demonstrate the basic use of the AED with the basic ABC's of resuscitation.   Anatomy and Cardiac Procedures: - Group verbal and visual presentation and models provide information about basic cardiac anatomy and function. Reviews the testing methods done to diagnose heart disease and the outcomes of the test results. Describes the treatment choices: Medical Management, Angioplasty, or Coronary Bypass Surgery for treating various heart conditions including Myocardial Infarction, Angina, Valve Disease, and Cardiac Arrhythmias.  Written material given at graduation. Flowsheet Row Cardiac Rehab from 11/10/2020 in [facility] Cardiac and Pulmonary Rehab  Education need identified 11/01/20       Medication Safety: - Group verbal and visual instruction to review commonly prescribed medications for heart and lung disease. Reviews the medication, class of the drug, and side effects. Includes the steps to properly store meds and maintain the prescription regimen.  Written material given at graduation.   Intimacy: - Group verbal instruction through game format to discuss how heart and lung disease can affect sexual intimacy. Written material given at graduation..   Know Your Numbers and Heart Failure: - Group verbal and visual instruction to discuss disease risk factors for cardiac and pulmonary disease and treatment options.  Reviews associated critical values for Overweight/Obesity, Hypertension, Cholesterol, and Diabetes.  Discusses basics of heart failure: signs/symptoms  and treatments.  Introduces Heart Failure Zone chart for action plan for heart failure.  Written material given at graduation.   Infection Prevention: - Provides verbal and written material to individual with discussion of infection control including proper hand washing and proper equipment cleaning during exercise session. Flowsheet Row Cardiac Rehab from 11/10/2020 in [facility] Cardiac and Pulmonary Rehab  Date 11/01/20  Educator [facility]  Instruction Review Code 1- Verbalizes Understanding       Falls Prevention: - Provides verbal and written material to individual with discussion of falls prevention and safety. Flowsheet Row Cardiac Rehab from 11/10/2020 in [facility] Cardiac and Pulmonary Rehab  Date 11/01/20  Educator [facility]  Instruction Review Code 1- Verbalizes Understanding       Other: -Provides group and verbal instruction on various topics (see comments)  Knowledge Questionnaire Score:  Knowledge Questionnaire Score - 11/01/20 0928       Knowledge Questionnaire Score   Pre Score 21/26 Education Focus: MI, chest pain, nurtition, exercise             Core Components/Risk Factors/Patient Goals at Admission:  Personal Goals and Risk Factors at Admission - 11/01/20 0929       Core Components/Risk Factors/Patient Goals on Admission    Weight Management Yes;Weight Loss    Intervention Weight Management: Develop a combined nutrition and exercise program designed to reach desired caloric intake, while maintaining appropriate intake of nutrient and fiber, sodium and fats, and appropriate energy expenditure required for the weight goal.;Weight Management: Provide education and appropriate resources to help participant work on and attain dietary goals.    Admit Weight 184 lb 4.8 oz (83.6 kg)    Goal Weight: Short Term 180 lb (81.6 kg)    Goal Weight: Long Term 175 lb (79.4 kg)    Expected Outcomes Short Term: Continue to assess and modify interventions until short term weight is  achieved;Long Term: Adherence to nutrition and physical activity/exercise program aimed toward attainment of established weight goal;Weight Loss: Understanding of general recommendations for a balanced deficit meal plan, which promotes 1-2 lb weight loss per week and includes a negative energy balance of [phone] kcal/d;Understanding of distribution of calorie intake throughout the day with the consumption of 4-5 meals/snacks;Understanding recommendations for meals to include 15-35% energy as protein, 25-35% energy from fat, 35-60% energy from carbohydrates, less than 200mg  of dietary cholesterol, 20-35 gm of total fiber daily    Diabetes Yes    Intervention Provide education about signs/symptoms and action to take for hypo/hyperglycemia.;Provide education about proper nutrition, including hydration, and aerobic/resistive exercise prescription along with prescribed medications to achieve blood glucose in normal ranges: Fasting glucose 65-99 mg/dL    Expected Outcomes Short Term: Participant verbalizes understanding of the signs/symptoms and immediate care of hyper/hypoglycemia, proper foot care and importance of medication, aerobic/resistive exercise and nutrition plan for blood glucose control.;Long Term: Attainment of HbA1C < 7%.    Hypertension Yes    Intervention Provide education on lifestyle modifcations including regular physical activity/exercise, weight management, moderate sodium restriction and increased consumption of fresh fruit, vegetables, and low fat dairy, alcohol moderation, and smoking cessation.;Monitor prescription use compliance.    Expected Outcomes Short Term: Continued assessment and intervention until BP is < 140/59mm HG in hypertensive participants. < 130/32mm HG in hypertensive participants with diabetes, heart failure or chronic kidney disease.;Long Term: Maintenance of blood pressure at goal levels.    Lipids Yes    Intervention Provide education and support for participant on  nutrition & aerobic/resistive exercise along with prescribed medications to achieve LDL 70mg , HDL >40mg .    Expected Outcomes Short Term: Participant states understanding of desired cholesterol values and is compliant with medications prescribed. Participant is following exercise prescription and nutrition guidelines.;Long Term: Cholesterol controlled with medications as prescribed, with individualized exercise RX and with personalized nutrition plan. Value goals: LDL < 70mg , HDL > 40 mg.             Education:Diabetes - Individual verbal and written instruction to review signs/symptoms of diabetes, desired ranges of glucose level fasting, after meals and with exercise. Acknowledge that pre and post exercise glucose checks will be done for 3 sessions at entry of program. [place] from 11/10/2020 in [facility] Cardiac and Pulmonary Rehab  Date 11/01/20  Educator [facility]  Instruction Review Code 1-  Verbalizes Understanding       Core Components/Risk Factors/Patient Goals Review:    Core Components/Risk Factors/Patient Goals at Discharge (Final Review):    ITP Comments:  ITP Comments     Row Name 10/26/20 [id number] 11/01/20 0919 11/03/20 0735 11/10/20 0854     ITP Comments Completed virtual orientation today.  EP evaluation is scheduled for Monday 6/6 at 8am.  Documentation for diagnosis can be found in [facility] encounter 10/06/20. Completed 6MWT and gym orientation. Initial ITP created and sent for review to Dr. Emily Filbert, Medical Director. First full day of exercise!  Patient was oriented to gym and equipment including functions, settings, policies, and procedures.  Patient's individual exercise prescription and treatment plan were reviewed.  All starting workloads were established based on the results of the 6 minute walk test done at initial orientation visit.  The plan for exercise progression was also introduced and progression will be customized based on patient's performance and  goals. 30 Day review completed. Medical Director ITP review done, changes made as directed, and signed approval by Medical Director.  new to program             Comments:

## 2020-11-10 NOTE — Progress Notes (Signed)
Daily Session Note  Patient Details  Name: Frank Wong MRN: [id number] Date of Birth: 01/02/1956 Referring Provider:   Flowsheet Row Cardiac Rehab from 11/01/2020 in [facility] Cardiac and Pulmonary Rehab  Referring Provider [name] MD       Encounter Date: 11/10/2020  Check In:  Session Check In - 11/10/20 0726       Check-In   Supervising physician immediately available to respond to emergencies See telemetry face sheet for immediately available ER MD    Location [facility]-Cardiac & Pulmonary Rehab    Staff Present Birdie Sons, MPA, RN;Joseph Hood RCP,RRT,BSRT;Melissa Caiola RDN, LDN    Virtual Visit No    Medication changes reported     No    Fall or balance concerns reported    No    Warm-up and Cool-down Performed on first and last piece of equipment    Resistance Training Performed Yes    VAD Patient? No    PAD/SET Patient? No      Pain Assessment   Currently in Pain? No/denies                Social History   Tobacco Use  Smoking Status Former   Pack years: 0.00  Smokeless Tobacco Former    Goals Met:  Independence with exercise equipment Exercise tolerated well No report of cardiac concerns or symptoms Strength training completed today  Goals Unmet:  Not Applicable  Comments: Pt able to follow exercise prescription today without complaint.  Will continue to monitor for progression.    Dr. Emily Filbert is Medical Director for [place].  Dr. Ottie Glazier is Medical Director for [facility] Pulmonary Rehabilitation.

## 2020-11-12 ENCOUNTER — Encounter: Payer: 59 | Admitting: *Deleted

## 2020-11-12 ENCOUNTER — Other Ambulatory Visit: Payer: Self-pay

## 2020-11-12 DIAGNOSIS — Z955 Presence of coronary angioplasty implant and graft: Secondary | ICD-10-CM

## 2020-11-15 ENCOUNTER — Other Ambulatory Visit: Payer: Self-pay

## 2020-11-15 DIAGNOSIS — Z955 Presence of coronary angioplasty implant and graft: Secondary | ICD-10-CM

## 2020-11-15 NOTE — Progress Notes (Signed)
Daily Session Note  Patient Details  Name: Frank Wong MRN: [id number] Date of Birth: 1956-05-13 Referring Provider:   Flowsheet Row Cardiac Rehab from 11/01/2020 in [facility] Cardiac and Pulmonary Rehab  Referring Provider [name] MD       Encounter Date: 11/12/2020  Check In:      Social History   Tobacco Use  Smoking Status Former   Pack years: 0.00  Smokeless Tobacco Former    Goals Met:  Independence with exercise equipment Exercise tolerated well No report of cardiac concerns or symptoms  Goals Unmet:  Not Applicable  Comments: Pt able to follow exercise prescription today without complaint.  Will continue to monitor for progression.    Dr. Emily Filbert is Medical Director for [place].  Dr. Ottie Glazier is Medical Director for [facility] Pulmonary Rehabilitation.

## 2020-11-15 NOTE — Progress Notes (Signed)
Completed initial RD consultation ?

## 2020-11-17 ENCOUNTER — Other Ambulatory Visit: Payer: Self-pay

## 2020-11-17 DIAGNOSIS — Z955 Presence of coronary angioplasty implant and graft: Secondary | ICD-10-CM | POA: Diagnosis not present

## 2020-11-17 NOTE — Progress Notes (Signed)
Daily Session Note  Patient Details  Name: Frank Wong MRN: [id number] Date of Birth: 23-Jul-1955 Referring Provider:   Flowsheet Row Cardiac [facility] from 11/01/2020 in [facility]  Referring Provider Frank Cowman MD       Encounter Date: 11/17/2020  Check In:  Session Check In - 11/17/20 0719       Check-In   Supervising physician immediately available to respond to emergencies See telemetry face sheet for immediately available ER MD    Location [facility]    Staff Present Birdie Sons, MPA, Elveria Rising, BA, ACSM CEP, Exercise Physiologist;Joseph Tessie Fass RCP,RRT,BSRT    Virtual Visit No    Medication changes reported     No    Fall or balance concerns reported    No    Warm-up and Cool-down Performed on first and last piece of equipment    Resistance Training Performed Yes    VAD Patient? No    PAD/SET Patient? No      Pain Assessment   Currently in Pain? No/denies                Social History   Tobacco Use  Smoking Status Former   Pack years: 0.00  Smokeless Tobacco Former    Goals Met:  Independence with exercise equipment Exercise tolerated well No report of cardiac concerns or symptoms Strength training completed today  Goals Unmet:  Not Applicable  Comments: Pt able to follow exercise prescription today without complaint.  Will continue to monitor for progression.    Dr. Emily Wong is Medical Director for [facility].  Dr. Ottie Wong is Medical Director for [facility] Pulmonary Rehabilitation.

## 2020-11-19 ENCOUNTER — Other Ambulatory Visit: Payer: Self-pay

## 2020-11-19 ENCOUNTER — Encounter: Payer: 59 | Admitting: *Deleted

## 2020-11-19 DIAGNOSIS — Z955 Presence of coronary angioplasty implant and graft: Secondary | ICD-10-CM | POA: Diagnosis not present

## 2020-11-19 NOTE — Progress Notes (Signed)
Daily Session Note  Patient Details  Name: Frank Wong MRN: [id number] Date of Birth: August 23, 1955 Referring Provider:   Flowsheet Row Cardiac Rehab from 11/01/2020 in [facility] Cardiac and Pulmonary Rehab  Referring Provider [name] MD       Encounter Date: 11/19/2020  Check In:  Session Check In - 11/19/20 0740       Check-In   Supervising physician immediately available to respond to emergencies See telemetry face sheet for immediately available ER MD    Location [facility]-Cardiac & Pulmonary Rehab    Staff Present Heath Lark, RN, BSN, CCRP;Joseph Hood RCP,RRT,BSRT;Jessica [place], [place], [place], [place], CCET    Virtual Visit No    Medication changes reported     No    Fall or balance concerns reported    No    Warm-up and Cool-down Performed on first and last piece of equipment    Resistance Training Performed Yes    VAD Patient? No    PAD/SET Patient? No      Pain Assessment   Currently in Pain? No/denies               Exercise Prescription Changes - 11/19/20 0700       Home Exercise Plan   Plans to continue exercise at [facility] (comment)   [employer]   Frequency Add 2 additional days to program exercise sessions.    Initial Home Exercises Provided 11/19/20             Social History   Tobacco Use  Smoking Status Former   Pack years: 0.00  Smokeless Tobacco Former    Goals Met: Independence with exercise equipment Exercise tolerated well No report of cardiac concerns or symptoms  Goals Unmet:  Not Applicable  Comments: Pt able to follow exercise prescription today without complaint.  Will continue to monitor for progression.    Dr. Emily Filbert is Medical Director for [place].  Dr. Ottie Glazier is Medical Director for [facility] Pulmonary Rehabilitation.

## 2020-11-22 ENCOUNTER — Other Ambulatory Visit: Payer: Self-pay

## 2020-11-22 ENCOUNTER — Encounter: Payer: 59 | Admitting: *Deleted

## 2020-11-22 DIAGNOSIS — Z955 Presence of coronary angioplasty implant and graft: Secondary | ICD-10-CM

## 2020-11-22 NOTE — Progress Notes (Signed)
Daily Session Note  Patient Details  Name: Frank Wong MRN: [id number] Date of Birth: [birth year] Referring Provider:   Flowsheet Row Cardiac Rehab from 11/01/2020 in [facility] Cardiac and Pulmonary Rehab  Referring Provider [name] MD       Encounter Date: 11/22/2020  Check In:  Session Check In - 11/22/20 0824       Check-In   Supervising physician immediately available to respond to emergencies See telemetry face sheet for immediately available ER MD    Location [facility]-Cardiac & Pulmonary Rehab    Staff Present Heath Lark, RN, BSN, Laveda Norman, BS, ACSM CEP, Exercise Physiologist;Joseph Tessie Fass RCP,RRT,BSRT    Virtual Visit No    Medication changes reported     No    Fall or balance concerns reported    No    Warm-up and Cool-down Performed on first and last piece of equipment    Resistance Training Performed Yes    VAD Patient? No    PAD/SET Patient? No      Pain Assessment   Currently in Pain? No/denies                Social History   Tobacco Use  Smoking Status Former   Pack years: 0.00  Smokeless Tobacco Former    Goals Met:  Independence with exercise equipment Personal goals reviewed No report of cardiac concerns or symptoms  Goals Unmet:  Not Applicable  Comments: Pt able to follow exercise prescription today without complaint.  Will continue to monitor for progression.    Dr. Emily Filbert is Medical Director for [place].  Dr. Ottie Glazier is Medical Director for [facility] Pulmonary Rehabilitation.

## 2020-11-23 ENCOUNTER — Other Ambulatory Visit: Payer: Self-pay | Admitting: Specialist

## 2020-11-23 ENCOUNTER — Other Ambulatory Visit (HOSPITAL_COMMUNITY): Payer: Self-pay | Admitting: Specialist

## 2020-11-23 DIAGNOSIS — R0602 Shortness of breath: Secondary | ICD-10-CM

## 2020-11-24 ENCOUNTER — Other Ambulatory Visit: Payer: Self-pay

## 2020-11-24 DIAGNOSIS — Z955 Presence of coronary angioplasty implant and graft: Secondary | ICD-10-CM | POA: Diagnosis not present

## 2020-11-24 NOTE — Progress Notes (Signed)
Daily Session Note  Patient Details  Name: Frank Wong MRN: [id number] Date of Birth: 09-Sep-1955 Referring Provider:   Flowsheet Row Cardiac Rehab from 11/01/2020 in [facility] Cardiac and Pulmonary Rehab  Referring Provider [name] MD       Encounter Date: 11/24/2020  Check In:  Session Check In - 11/24/20 0730       Check-In   Supervising physician immediately available to respond to emergencies See telemetry face sheet for immediately available ER MD    Location [facility]-Cardiac & Pulmonary Rehab    Staff Present Birdie Sons, MPA, Nino Glow, MS, ASCM CEP, Exercise Physiologist;Kristen Coble, RN,BC,MSN;Joseph [place], [place]    Virtual Visit No    Medication changes reported     No    Fall or balance concerns reported    No    Warm-up and Cool-down Performed on first and last piece of equipment    Resistance Training Performed Yes    VAD Patient? No    PAD/SET Patient? No      Pain Assessment   Currently in Pain? No/denies                Social History   Tobacco Use  Smoking Status Former   Pack years: 0.00  Smokeless Tobacco Former    Goals Met:  Independence with exercise equipment Exercise tolerated well No report of cardiac concerns or symptoms Strength training completed today  Goals Unmet:  Not Applicable  Comments: Pt able to follow exercise prescription today without complaint.  Will continue to monitor for progression.    Dr. Emily Filbert is Medical Director for [place].  Dr. Ottie Glazier is Medical Director for [facility] Pulmonary Rehabilitation.

## 2020-11-26 ENCOUNTER — Other Ambulatory Visit: Payer: Self-pay

## 2020-11-26 ENCOUNTER — Encounter: Payer: 59 | Attending: Cardiology | Admitting: *Deleted

## 2020-11-26 DIAGNOSIS — Z955 Presence of coronary angioplasty implant and graft: Secondary | ICD-10-CM | POA: Diagnosis not present

## 2020-11-26 DIAGNOSIS — Z48812 Encounter for surgical aftercare following surgery on the circulatory system: Secondary | ICD-10-CM | POA: Insufficient documentation

## 2020-11-26 NOTE — Progress Notes (Signed)
Daily Session Note  Patient Details  Name: Frank Wong MRN: [id number] Date of Birth: Mar 30, 1956 Referring Provider:   Flowsheet Row Cardiac Rehab from 11/01/2020 in [facility] Cardiac and Pulmonary Rehab  Referring Provider [name] MD       Encounter Date: 11/26/2020  Check In:  Session Check In - 11/26/20 0806       Check-In   Supervising physician immediately available to respond to emergencies See telemetry face sheet for immediately available ER MD    Location [facility]-Cardiac & Pulmonary Rehab    Staff Present Heath Lark, RN, BSN, CCRP;Joseph [place], RCP,RRT,BSRT;Jessica [place], [place], [place], CCRP, CCET    Virtual Visit No    Medication changes reported     No    Fall or balance concerns reported    No    Warm-up and Cool-down Performed on first and last piece of equipment    Resistance Training Performed Yes    VAD Patient? No    PAD/SET Patient? No      Pain Assessment   Currently in Pain? No/denies                Social History   Tobacco Use  Smoking Status Former   Pack years: 0.00  Smokeless Tobacco Former    Goals Met:  Independence with exercise equipment Exercise tolerated well No report of cardiac concerns or symptoms  Goals Unmet:  Not Applicable  Comments: Pt able to follow exercise prescription today without complaint.  Will continue to monitor for progression.    Dr. Emily Filbert is Medical Director for [place].  Dr. Ottie Glazier is Medical Director for [facility] Pulmonary Rehabilitation.

## 2020-12-01 ENCOUNTER — Other Ambulatory Visit: Payer: Self-pay

## 2020-12-01 DIAGNOSIS — Z955 Presence of coronary angioplasty implant and graft: Secondary | ICD-10-CM

## 2020-12-01 DIAGNOSIS — Z48812 Encounter for surgical aftercare following surgery on the circulatory system: Secondary | ICD-10-CM | POA: Diagnosis not present

## 2020-12-01 NOTE — Progress Notes (Signed)
Daily Session Note  Patient Details  Name: Frank Wong MRN: [id number] Date of Birth: Sep 01, 1955 Referring Provider:   Flowsheet Row Cardiac Rehab from 11/01/2020 in [facility] Cardiac and Pulmonary Rehab  Referring Provider [name] MD       Encounter Date: 12/01/2020  Check In:  Session Check In - 12/01/20 0725       Check-In   Supervising physician immediately available to respond to emergencies See telemetry face sheet for immediately available ER MD    Location [facility]-Cardiac & Pulmonary Rehab    Staff Present Birdie Sons, MPA, RN;Joseph Tessie Fass, RCP,RRT,BSRT;Laureen Somers, BS, RRT, CPFT    Virtual Visit No    Medication changes reported     No    Fall or balance concerns reported    No    Warm-up and Cool-down Performed on first and last piece of equipment    Resistance Training Performed Yes    VAD Patient? No    PAD/SET Patient? No      Pain Assessment   Currently in Pain? No/denies                Social History   Tobacco Use  Smoking Status Former   Pack years: 0.00  Smokeless Tobacco Former    Goals Met:  Independence with exercise equipment Exercise tolerated well No report of cardiac concerns or symptoms Strength training completed today  Goals Unmet:  Not Applicable  Comments: Pt able to follow exercise prescription today without complaint.  Will continue to monitor for progression.    Dr. Emily Filbert is Medical Director for [place].  Dr. Ottie Glazier is Medical Director for [facility] Pulmonary Rehabilitation.

## 2020-12-03 ENCOUNTER — Other Ambulatory Visit: Payer: Self-pay

## 2020-12-03 ENCOUNTER — Encounter: Payer: 59 | Admitting: *Deleted

## 2020-12-03 DIAGNOSIS — Z955 Presence of coronary angioplasty implant and graft: Secondary | ICD-10-CM

## 2020-12-03 DIAGNOSIS — Z48812 Encounter for surgical aftercare following surgery on the circulatory system: Secondary | ICD-10-CM | POA: Diagnosis not present

## 2020-12-03 NOTE — Progress Notes (Signed)
Daily Session Note  Patient Details  Name: Frank Wong MRN: [id number] Date of Birth: July 30, 1955 Referring Provider:   Flowsheet Row Cardiac Rehab from 11/01/2020 in [facility] Cardiac and Pulmonary Rehab  Referring Provider [name] MD       Encounter Date: 12/03/2020  Check In:  Session Check In - 12/03/20 0805       Check-In   Supervising physician immediately available to respond to emergencies See telemetry face sheet for immediately available ER MD    Location [facility]-Cardiac & Pulmonary Rehab    Staff Present Hope Budds, RDN, LDN;Jessica Luan [place], MA, RCEP, CCRP, CCET;Tamica Covell, RN, BSN, CCRP    Virtual Visit No    Medication changes reported     No    Fall or balance concerns reported    No    Warm-up and Cool-down Performed on first and last piece of equipment    Resistance Training Performed Yes    VAD Patient? No    PAD/SET Patient? No      Pain Assessment   Currently in Pain? No/denies                Social History   Tobacco Use  Smoking Status Former   Pack years: 0.00  Smokeless Tobacco Former    Goals Met:  Independence with exercise equipment Exercise tolerated well No report of cardiac concerns or symptoms  Goals Unmet:  Not Applicable  Comments: Pt able to follow exercise prescription today without complaint.  Will continue to monitor for progression.    Dr. Emily Filbert is Medical Director for [place].  Dr. Ottie Glazier is Medical Director for [facility] Pulmonary Rehabilitation.

## 2020-12-06 ENCOUNTER — Encounter: Payer: 59 | Admitting: *Deleted

## 2020-12-06 ENCOUNTER — Ambulatory Visit
Admission: RE | Admit: 2020-12-06 | Discharge: 2020-12-06 | Disposition: A | Discharge status: Home / Self Care | Payer: 59 | Source: Ambulatory Visit | Attending: Specialist | Admitting: Specialist

## 2020-12-06 ENCOUNTER — Other Ambulatory Visit: Payer: Self-pay

## 2020-12-06 DIAGNOSIS — Z955 Presence of coronary angioplasty implant and graft: Secondary | ICD-10-CM

## 2020-12-06 DIAGNOSIS — Z48812 Encounter for surgical aftercare following surgery on the circulatory system: Secondary | ICD-10-CM | POA: Diagnosis not present

## 2020-12-06 DIAGNOSIS — R0602 Shortness of breath: Secondary | ICD-10-CM | POA: Insufficient documentation

## 2020-12-06 NOTE — Progress Notes (Signed)
Daily Session Note  Patient Details  Name: Frank Wong MRN: [id number] Date of Birth: 09-22-1955 Referring Provider:   Flowsheet Row Cardiac Rehab from 11/01/2020 in [facility] Cardiac and Pulmonary Rehab  Referring Provider [name] MD       Encounter Date: 12/06/2020  Check In:  Session Check In - 12/06/20 0814       Check-In   Supervising physician immediately available to respond to emergencies See telemetry face sheet for immediately available ER MD    Location [facility]-Cardiac & Pulmonary Rehab    Staff Present Heath Lark, RN, BSN, CCRP;Kristen Coble, RN,BC,MSN;Kelly Bejou, BS, ACSM CEP, Exercise Physiologist    Virtual Visit No    Medication changes reported     No    Fall or balance concerns reported    No    Warm-up and Cool-down Performed on first and last piece of equipment    Resistance Training Performed Yes    VAD Patient? No    PAD/SET Patient? No      Pain Assessment   Currently in Pain? No/denies                Social History   Tobacco Use  Smoking Status Former   Pack years: 0.00  Smokeless Tobacco Former    Goals Met:  Independence with exercise equipment Exercise tolerated well No report of cardiac concerns or symptoms  Goals Unmet:  Not Applicable  Comments: Pt able to follow exercise prescription today without complaint.  Will continue to monitor for progression.    Dr. Emily Filbert is Medical Director for [place].  Dr. Ottie Glazier is Medical Director for [facility] Pulmonary Rehabilitation.

## 2020-12-08 ENCOUNTER — Other Ambulatory Visit: Payer: Self-pay

## 2020-12-08 ENCOUNTER — Encounter: Payer: 59 | Admitting: *Deleted

## 2020-12-08 ENCOUNTER — Encounter: Payer: Self-pay | Admitting: *Deleted

## 2020-12-08 DIAGNOSIS — Z955 Presence of coronary angioplasty implant and graft: Secondary | ICD-10-CM

## 2020-12-08 DIAGNOSIS — Z48812 Encounter for surgical aftercare following surgery on the circulatory system: Secondary | ICD-10-CM | POA: Diagnosis not present

## 2020-12-08 NOTE — Progress Notes (Signed)
Daily Session Note  Patient Details  Name: Frank Wong MRN: [id number] Date of Birth: Oct 20, 1955 Referring Provider:   Flowsheet Row Cardiac Rehab from 11/01/2020 in [facility] Cardiac and Pulmonary Rehab  Referring Provider [name] MD       Encounter Date: 12/08/2020  Check In:  Session Check In - 12/08/20 0753       Check-In   Supervising physician immediately available to respond to emergencies See telemetry face sheet for immediately available ER MD    Location [facility]-Cardiac & Pulmonary Rehab    Staff Present Heath Lark, RN, BSN, CCRP;Melissa [place], RDN, LDN;Jessica [place], MA, RCEP, CCRP, Kathaleen Maser, MPA, RN    Virtual Visit No    Medication changes reported     No    Fall or balance concerns reported    No    Warm-up and Cool-down Performed on first and last piece of equipment    Resistance Training Performed Yes    VAD Patient? No    PAD/SET Patient? No      Pain Assessment   Currently in Pain? No/denies                Social History   Tobacco Use  Smoking Status Former   Pack years: 0.00  Smokeless Tobacco Former    Goals Met:  Independence with exercise equipment Exercise tolerated well No report of cardiac concerns or symptoms  Goals Unmet:  Not Applicable  Comments: Pt able to follow exercise prescription today without complaint.  Will continue to monitor for progression.    Dr. Emily Filbert is Medical Director for [place].  Dr. Ottie Glazier is Medical Director for [facility] Pulmonary Rehabilitation.

## 2020-12-08 NOTE — Progress Notes (Signed)
Cardiac Individual Treatment Plan  Patient Details  Name: Frank Wong MRN: [id number] Date of Birth: [birth year] Referring Provider:   Flowsheet Row Cardiac Rehab from 11/01/2020 in [facility] Cardiac and Pulmonary Rehab  Referring Provider [name] MD       Initial Encounter Date:  Flowsheet Row Cardiac Rehab from 11/01/2020 in [facility] Cardiac and Pulmonary Rehab  Date 11/01/20       Visit Diagnosis: Status post coronary artery stent placement  Patient's Home Medications on Admission:  Current Outpatient Medications:    aspirin EC 81 MG tablet, Take 81 mg by mouth in the morning., Disp: , Rfl:    atorvastatin (LIPITOR) 80 MG tablet, Take 80 mg by mouth in the morning., Disp: , Rfl:    B Complex-C (B-COMPLEX WITH VITAMIN C) tablet, Take 1 tablet by mouth in the morning., Disp: , Rfl:    benazepril (LOTENSIN) 20 MG tablet, Take 20 mg by mouth in the morning., Disp: , Rfl:    CINNAMON PO, Take 1,000 mg by mouth in the morning and at bedtime., Disp: , Rfl:    clonazePAM (KLONOPIN) 2 MG tablet, Take 1 mg by mouth at bedtime as needed (sleep/ anxiety)., Disp: , Rfl:    clopidogrel (PLAVIX) 75 MG tablet, Take 75 mg by mouth in the morning., Disp: , Rfl:    dicyclomine (BENTYL) 10 MG capsule, Take 10 mg by mouth 4 (four) times daily as needed for spasms., Disp: , Rfl:    EPINEPHrine 0.3 mg/0.3 mL IJ SOAJ injection, Inject 0.3 mg into the muscle as needed for anaphylaxis., Disp: , Rfl:    ferrous sulfate 325 (65 FE) MG tablet, Take 325 mg by mouth in the morning., Disp: , Rfl:    fexofenadine (ALLEGRA) 180 MG tablet, Take 180 mg by mouth in the morning., Disp: , Rfl:    gabapentin (NEURONTIN) 600 MG tablet, Take 300 mg by mouth at bedtime., Disp: , Rfl:    GAVILYTE-G 236 g solution, Take 4,000 mLs by mouth as directed., Disp: , Rfl:    glipiZIDE (GLUCOTROL) 10 MG tablet, Take 10 mg by mouth every morning., Disp: , Rfl:    hyoscyamine (LEVSIN SL) 0.125 MG SL tablet, Place 0.125 mg under  the tongue every 6 (six) hours as needed (abdominal spasms)., Disp: , Rfl:    JARDIANCE 25 MG TABS tablet, Take 25 mg by mouth in the morning., Disp: , Rfl:    levocetirizine (XYZAL) 5 MG tablet, Take 5 mg by mouth every evening., Disp: , Rfl:    magnesium oxide (MAG-OX) 400 MG tablet, Take 400 mg by mouth in the morning., Disp: , Rfl:    melatonin 5 MG TABS, Take 5 mg by mouth at bedtime as needed (sleep)., Disp: , Rfl:    metFORMIN (GLUCOPHAGE) 1000 MG tablet, Take 1,000 mg by mouth 2 (two) times daily with a meal. , Disp: , Rfl:    montelukast (SINGULAIR) 10 MG tablet, Take 10 mg by mouth at bedtime., Disp: , Rfl:    Multiple Vitamin (MULTIVITAMIN WITH MINERALS) TABS tablet, Take 1 tablet by mouth in the morning., Disp: , Rfl:    pantoprazole (PROTONIX) 40 MG tablet, Take 40 mg by mouth at bedtime., Disp: , Rfl:    Peppermint Oil (IBGARD PO), Take 2 tablets by mouth in the morning and at bedtime., Disp: , Rfl:    psyllium (METAMUCIL SMOOTH TEXTURE) 28 % packet, Take 1 packet by mouth in the morning., Disp: , Rfl:    sildenafil (REVATIO)  20 MG tablet, Take 10 mg by mouth in the morning and at bedtime., Disp: , Rfl:    Testosterone 20.25 MG/ACT (1.62%) GEL, Apply 2 Pump topically in the morning. Applied to shoulders., Disp: , Rfl:    triamcinolone cream (KENALOG) 0.1 %, Apply 1 application topically 2 (two) times daily as needed (skin irritation/itching)., Disp: , Rfl:    umeclidinium-vilanterol (ANORO ELLIPTA) 62.5-25 MCG/INH AEPB, Inhale 1 puff into the lungs in the morning., Disp: , Rfl:   Past Medical History: Past Medical History:  Diagnosis Date   Asthma    CAD (coronary artery disease)    Cardiac arrest ([place])    Diabetes mellitus without complication ([place])    GERD (gastroesophageal reflux disease)    Heart attack ([place])    Hypertension    Shoulder problem 2010   right   Sleep apnea     Tobacco Use: Social History   Tobacco Use  Smoking Status Former   Pack years: 0.00   Smokeless Tobacco Former    Labs: Recent Chemical engineer   There is no flowsheet data to display.      Exercise Target Goals: Exercise Program Goal: Individual exercise prescription set using results from initial 6 min walk test and THRR while considering  patient's activity barriers and safety.   Exercise Prescription Goal: Initial exercise prescription builds to 30-45 minutes a day of aerobic activity, 2-3 days per week.  Home exercise guidelines will be given to patient during program as part of exercise prescription that the participant will acknowledge.   Education: Aerobic Exercise: - Group verbal and visual presentation on the components of exercise prescription. Introduces F.I.T.T principle from ACSM for exercise prescriptions.  Reviews F.I.T.T. principles of aerobic exercise including progression. Written material given at graduation. Flowsheet Row Cardiac Rehab from 12/08/2020 in [facility] Cardiac and Pulmonary Rehab  Education need identified 11/01/20  Date 11/17/20  Educator [facility]  Instruction Review Code 1- Verbalizes Understanding       Education: Resistance Exercise: - Group verbal and visual presentation on the components of exercise prescription. Introduces F.I.T.T principle from ACSM for exercise prescriptions  Reviews F.I.T.T. principles of resistance exercise including progression. Written material given at graduation. Flowsheet Row Cardiac Rehab from 12/08/2020 in [facility] Cardiac and Pulmonary Rehab  Date 11/24/20  Educator [facility]  Instruction Review Code 1- Verbalizes Understanding        Education: Exercise & Equipment Safety: - Individual verbal instruction and demonstration of equipment use and safety with use of the equipment. Flowsheet Row Cardiac Rehab from 12/08/2020 in [facility] Cardiac and Pulmonary Rehab  Date 11/01/20  Educator [facility]  Instruction Review Code 1- Verbalizes Understanding       Education: Exercise Physiology & General Exercise  Guidelines: - Group verbal and written instruction with models to review the exercise physiology of the cardiovascular system and associated critical values. Provides general exercise guidelines with specific guidelines to those with heart or lung disease.  Flowsheet Row Cardiac Rehab from 12/08/2020 in [facility] Cardiac and Pulmonary Rehab  Date 11/10/20  Educator [facility]  Instruction Review Code 1- Verbalizes Understanding       Education: Flexibility, Balance, Mind/Body Relaxation: - Group verbal and visual presentation with interactive activity on the components of exercise prescription. Introduces F.I.T.T principle from ACSM for exercise prescriptions. Reviews F.I.T.T. principles of flexibility and balance exercise training including progression. Also discusses the mind body connection.  Reviews various relaxation techniques to help reduce and manage stress (i.e. Deep breathing, progressive muscle relaxation, and visualization).  Balance handout provided to take home. Written material given at graduation. Flowsheet Row Cardiac Rehab from 12/08/2020 in [facility] Cardiac and Pulmonary Rehab  Date 12/01/20  Educator [facility]  Instruction Review Code 1- Verbalizes Understanding       Activity Barriers & Risk Stratification:  Activity Barriers & Cardiac Risk Stratification - 11/01/20 0920       Activity Barriers & Cardiac Risk Stratification   Activity Barriers Muscular Weakness;Deconditioning;Joint Problems;Other (comment);Back Problems    Comments hx of sports injuries, played football and refereed soccer    Cardiac Risk Stratification Moderate             6 Minute Walk:  6 Minute Walk     Row Name 11/01/20 0920         6 Minute Walk   Phase Initial     Distance 1455 feet     Walk Time 6 minutes     # of Rest Breaks 0     MPH 2.76     METS 3.78     RPE 9     Perceived Dyspnea  1     VO2 Peak 13.22     Symptoms No     Resting HR 74 bpm     Resting BP 134/72     Resting Oxygen  Saturation  98 %     Exercise Oxygen Saturation  during 6 min walk 98 %     Max Ex. HR 110 bpm     Max Ex. BP 136/70     2 Minute Post BP 114/66              Oxygen Initial Assessment:   Oxygen Re-Evaluation:   Oxygen Discharge (Final Oxygen Re-Evaluation):   Initial Exercise Prescription:  Initial Exercise Prescription - 11/01/20 0900       Date of Initial Exercise RX and Referring Provider   Date 11/01/20    Referring Provider Paraschos, Alexander MD      Treadmill   MPH 2.7    Grade 1    Minutes 15    METs 3.44      Recumbant Bike   Level 3    RPM 50    Watts 47    Minutes 15    METs 3.5      REL-XR   Level 3    Speed 50    Minutes 15    METs 3.5      Track   Laps 40    Minutes 15    METs 3.2      Prescription Details   Frequency (times per week) 3    Duration Progress to 30 minutes of continuous aerobic without signs/symptoms of physical distress      Intensity   THRR 40-80% of Max Heartrate 108-141    Ratings of Perceived Exertion 11-13    Perceived Dyspnea 0-4      Progression   Progression Continue to progress workloads to maintain intensity without signs/symptoms of physical distress.      Resistance Training   Training Prescription Yes    Weight 4 lb    Reps 10-15             Perform Capillary Blood Glucose checks as needed.  Exercise Prescription Changes:   Exercise Prescription Changes     Row Name 11/01/20 0900 11/10/20 0700 11/19/20 0700 11/22/20 1400 12/06/20 1000     Response to Exercise   Blood Pressure (Admit) 134/72 114/70 -- 122/62 132/64  Blood Pressure (Exercise) 136/70 132/82 -- 106/64 --   Blood Pressure (Exit) 114/66 98/58 -- 122/80 122/62   Heart Rate (Admit) 74 bpm 75 bpm -- 78 bpm 61 bpm   Heart Rate (Exercise) 110 bpm 128 bpm -- 142 bpm 138 bpm   Heart Rate (Exit) 87 bpm 104 bpm -- 128 bpm 119 bpm   Oxygen Saturation (Admit) 98 % -- -- -- --   Oxygen Saturation (Exercise) 98 % -- -- -- --    Rating of Perceived Exertion (Exercise) 9 12 -- 14 14   Perceived Dyspnea (Exercise) 1 -- -- -- --   Symptoms SOB -- -- none none   Comments walk test results -- -- -- --   Duration -- Progress to 30 minutes of  aerobic without signs/symptoms of physical distress -- Continue with 30 min of aerobic exercise without signs/symptoms of physical distress. Continue with 30 min of aerobic exercise without signs/symptoms of physical distress.   Intensity -- THRR unchanged -- THRR unchanged THRR unchanged     Progression   Progression -- Continue to progress workloads to maintain intensity without signs/symptoms of physical distress. -- Continue to progress workloads to maintain intensity without signs/symptoms of physical distress. Continue to progress workloads to maintain intensity without signs/symptoms of physical distress.   Average METs -- 4.67 -- 3.75 4.21     Resistance Training   Training Prescription -- Yes -- Yes Yes   Weight -- 5 lb -- 5 lb 5 lb   Reps -- 10-15 -- 10-15 10-15     Interval Training   Interval Training -- -- -- No No     Treadmill   MPH -- -- -- 3.5 3.5   Grade -- -- -- 1 1   Minutes -- -- -- 15 15   METs -- -- -- 4.16 4.16     Recumbant Bike   Level -- -- -- 6 7   Watts -- -- -- 51 51   Minutes -- -- -- 15 15   METs -- -- -- 3.85 3.89     REL-XR   Level -- -- -- 8 8   Minutes -- -- -- 15 15   METs -- -- -- 6.2 5     Home Exercise Plan   Plans to continue exercise at -- -- [employer] (comment)  [employer] (comment)  [employer] (comment)  [employer]   Frequency -- -- Add 2 additional days to program exercise sessions. Add 2 additional days to program exercise sessions. Add 2 additional days to program exercise sessions.   Initial Home Exercises Provided -- -- 11/19/20 11/19/20 11/19/20            Exercise Comments:   Exercise Comments     Row Name 11/03/20 0735           Exercise Comments First full day of exercise!   Patient was oriented to gym and equipment including functions, settings, policies, and procedures.  Patient's individual exercise prescription and treatment plan were reviewed.  All starting workloads were established based on the results of the 6 minute walk test done at initial orientation visit.  The plan for exercise progression was also introduced and progression will be customized based on patient's performance and goals.                Exercise Goals and Review:   Exercise Goals     Row Name 11/01/20 [phone]  Exercise Goals   Increase Physical Activity Yes       Intervention Provide advice, education, support and counseling about physical activity/exercise needs.;Develop an individualized exercise prescription for aerobic and resistive training based on initial evaluation findings, risk stratification, comorbidities and participant's personal goals.       Expected Outcomes Long Term: Add in home exercise to make exercise part of routine and to increase amount of physical activity.;Short Term: Attend rehab on a regular basis to increase amount of physical activity.;Long Term: Exercising regularly at least 3-5 days a week.       Increase Strength and Stamina Yes       Intervention Provide advice, education, support and counseling about physical activity/exercise needs.;Develop an individualized exercise prescription for aerobic and resistive training based on initial evaluation findings, risk stratification, comorbidities and participant's personal goals.       Expected Outcomes Short Term: Increase workloads from initial exercise prescription for resistance, speed, and METs.;Short Term: Perform resistance training exercises routinely during rehab and add in resistance training at home;Long Term: Improve cardiorespiratory fitness, muscular endurance and strength as measured by increased METs and functional capacity (6MWT)       Able to understand and use rate of perceived  exertion (RPE) scale Yes       Intervention Provide education and explanation on how to use RPE scale       Expected Outcomes Long Term:  Able to use RPE to guide intensity level when exercising independently;Short Term: Able to use RPE daily in rehab to express subjective intensity level       Able to understand and use Dyspnea scale Yes       Intervention Provide education and explanation on how to use Dyspnea scale       Expected Outcomes Short Term: Able to use Dyspnea scale daily in rehab to express subjective sense of shortness of breath during exertion;Long Term: Able to use Dyspnea scale to guide intensity level when exercising independently       Knowledge and understanding of Target Heart Rate Range (THRR) Yes       Intervention Provide education and explanation of THRR including how the numbers were predicted and where they are located for reference       Expected Outcomes Short Term: Able to state/look up THRR;Short Term: Able to use daily as guideline for intensity in rehab;Long Term: Able to use THRR to govern intensity when exercising independently       Able to check pulse independently Yes       Intervention Provide education and demonstration on how to check pulse in carotid and radial arteries.;Review the importance of being able to check your own pulse for safety during independent exercise       Expected Outcomes Short Term: Able to explain why pulse checking is important during independent exercise;Long Term: Able to check pulse independently and accurately       Understanding of Exercise Prescription Yes       Intervention Provide education, explanation, and written materials on patient's individual exercise prescription       Expected Outcomes Short Term: Able to explain program exercise prescription;Long Term: Able to explain home exercise prescription to exercise independently                Exercise Goals Re-Evaluation :  Exercise Goals Re-Evaluation     Row Name  11/03/20 0735 11/10/20 0756 11/19/20 0739 11/22/20 1439 12/06/20 1048     Exercise Goal Re-Evaluation  Exercise Goals Review Able to understand and use rate of perceived exertion (RPE) scale;Knowledge and understanding of Target Heart Rate Range (THRR);Increase Physical Activity;Understanding of Exercise Prescription;Increase Strength and Stamina;Able to understand and use Dyspnea scale;Able to check pulse independently Increase Physical Activity;Increase Strength and Stamina Increase Physical Activity;Increase Strength and Stamina;Able to understand and use rate of perceived exertion (RPE) scale;Able to understand and use Dyspnea scale;Knowledge and understanding of Target Heart Rate Range (THRR);Able to check pulse independently;Understanding of Exercise Prescription Increase Physical Activity;Increase Strength and Stamina;Understanding of Exercise Prescription Increase Physical Activity;Increase Strength and Stamina   Comments Reviewed RPE and dyspnea scales, THR and program prescription with pt today.  Pt voiced understanding and was given a copy of goals to take home. Agapito is doing well so far with exercise.  He has increased to 5 lb for strength training.  Staff will monitor progress. Reviewed home exercise with pt today.  Pt plans to walk and use weights at home for exercise.  He also belongs to the [employer] and likes to use the elliptical there. Reviewed THR, pulse, RPE, sign and symptoms, pulse oximetery and when to call 911 or MD.  Also discussed weather considerations and indoor options.  Pt voiced understanding. [name] is doing well in rehab.  He is level 8 on the XR and 6 on the bike. We will continue to monitor his progress. Emmauel is continuing to do well. His level on the recumbant bike has increased to level 7. He continues to hit his THR. Will continue to monitor.   Expected Outcomes Short: Use RPE daily to regulate intensity. Long: Follow program prescription in THR. Short:  attend consistently  Long:  improve overall stamina Short: Start to add exercise back in Long: Continue to improve stamina Short: Continue increase workloads Long: Continue to improve stamina. Short: Increase incline on treadmill Long: Continue to increase overall MET level            Discharge Exercise Prescription (Final Exercise Prescription Changes):  Exercise Prescription Changes - 12/06/20 1000       Response to Exercise   Blood Pressure (Admit) 132/64    Blood Pressure (Exit) 122/62    Heart Rate (Admit) 61 bpm    Heart Rate (Exercise) 138 bpm    Heart Rate (Exit) 119 bpm    Rating of Perceived Exertion (Exercise) 14    Symptoms none    Duration Continue with 30 min of aerobic exercise without signs/symptoms of physical distress.    Intensity THRR unchanged      Progression   Progression Continue to progress workloads to maintain intensity without signs/symptoms of physical distress.    Average METs 4.21      Resistance Training   Training Prescription Yes    Weight 5 lb    Reps 10-15      Interval Training   Interval Training No      Treadmill   MPH 3.5    Grade 1    Minutes 15    METs 4.16      Recumbant Bike   Level 7    Watts 51    Minutes 15    METs 3.89      REL-XR   Level 8    Minutes 15    METs 5      Home Exercise Plan   Plans to continue exercise at [employer] (comment)   [employer]   Frequency Add 2 additional days to program exercise sessions.    Initial Home  Exercises Provided 11/19/20             Nutrition:  Target Goals: Understanding of nutrition guidelines, daily intake of sodium <1559m, cholesterol <2066m calories 30% from fat and 7% or less from saturated fats, daily to have 5 or more servings of fruits and vegetables.  Education: All About Nutrition: -Group instruction provided by verbal, written material, interactive activities, discussions, models, and posters to present general guidelines for heart healthy nutrition including fat, fiber,  MyPlate, the role of sodium in heart healthy nutrition, utilization of the nutrition label, and utilization of this knowledge for meal planning. Follow up email sent as well. Written material given at graduation. [facility]  Education need identified 11/01/20  Date 12/08/20  Educator [facility] Review Code 1- Verbalizes Understanding       Biometrics:  Pre Biometrics - 11/01/20 0926       Pre Biometrics   Height 5' 8.8" (1.748 m)    Weight 184 lb 4.8 oz (83.6 kg)    BMI (Calculated) 27.36    Single Leg Stand 30 seconds              Nutrition Therapy Plan and Nutrition Goals:  Nutrition Therapy & Goals - 11/15/20 1334       Nutrition Therapy   Diet Heart healthy, low Na    Protein (specify units) 65g    Fiber 30 grams    Whole Grain Foods 3 servings    Saturated Fats 12 max. grams    Fruits and Vegetables 8 servings/day    Sodium 1.5 grams      Personal Nutrition Goals   Nutrition Goal ST: honor hunger - this may mean including more satisfying snacks LT: manage portion sizes of dinner meal    Comments FODMAPs - recommended by gastroenterologist, waiting to see what the colonoscopy results. Colonoscopy in Augusts. B: special K cereal (almond milk) S: carrots/celery L: sandwich (tuKuwaitham, or chicken with pickles on whole wheat bread) with some chips like doritos S: carrots ans celery D: limiting fast food - he gets take out from restaurants. He will have vegetables and meatloaf, chicken and dumplings, 6 oz steak. He reports cooking some salmon with grilled mixed vegetables. Will also have an apple a day. Drinks: water, coffee - black , propel or gateraide mixed drinks 1x/day. Discussed heart healthy eating.      Intervention Plan   Intervention Prescribe, educate and counsel regarding individualized specific dietary modifications aiming towards targeted core components such as weight, hypertension,  lipid management, diabetes, heart failure and other comorbidities.    Expected Outcomes Short Term Goal: Understand basic principles of dietary content, such as calories, fat, sodium, cholesterol and nutrients.;Short Term Goal: A plan has been developed with personal nutrition goals set during dietitian appointment.;Long Term Goal: Adherence to prescribed nutrition plan.             Nutrition Assessments:  MEDIFICTS Score Key: ?70 Need to make dietary changes  40-70 Heart Healthy Diet ? 40 Therapeutic Level Cholesterol Diet  [facility]  Picture Your Plate Total Score on Admission 68      Picture Your Plate Scores: <4<02nhealthy dietary pattern with much room for improvement. 41-50 Dietary pattern unlikely to meet recommendations for good health and room for improvement. 51-60 More healthful dietary pattern, with some room for improvement.  >60 Healthy dietary pattern,  although there may be some specific behaviors that could be improved.    Nutrition Goals Re-Evaluation:   Nutrition Goals Discharge (Final Nutrition Goals Re-Evaluation):   Psychosocial: Target Goals: Acknowledge presence or absence of significant depression and/or stress, maximize coping skills, provide positive support system. Participant is able to verbalize types and ability to use techniques and skills needed for reducing stress and depression.   Education: Stress, Anxiety, and Depression - Group verbal and visual presentation to define topics covered.  Reviews how body is impacted by stress, anxiety, and depression.  Also discusses healthy ways to reduce stress and to treat/manage anxiety and depression.  Written material given at graduation.   Education: Sleep Hygiene -Provides group verbal and written instruction about how sleep can affect your health.  Define sleep hygiene, discuss sleep cycles and impact of sleep habits. Review good  sleep hygiene tips.    Initial Review & Psychosocial Screening:  Initial Psych Review & Screening - 10/26/20 0942       Initial Review   Current issues with History of Depression;Current Psychotropic Meds;Current Depression;Current Sleep Concerns;Current Stress Concerns    Source of Stress Concerns Chronic Illness;Unable to participate in former interests or hobbies;Unable to perform yard/household activities    Comments currently feeling managed on meds, dealing with back issues and SOB since all this has happened      [place]? Yes   wife and children     Barriers   Psychosocial barriers to participate in program The patient should benefit from training in stress management and relaxation.;Psychosocial barriers identified (see note)      Screening Interventions   Interventions To provide support and resources with identified psychosocial needs;Provide feedback about the scores to participant;Encouraged to exercise    Expected Outcomes Short Term goal: Utilizing psychosocial counselor, staff and physician to assist with identification of specific Stressors or current issues interfering with healing process. Setting desired goal for each stressor or current issue identified.;Long Term Goal: Stressors or current issues are controlled or eliminated.;Short Term goal: Identification and review with participant of any Quality of Life or Depression concerns found by scoring the questionnaire.;Long Term goal: The participant improves quality of Life and PHQ9 Scores as seen by post scores and/or verbalization of changes             Quality of Life Scores:   Quality of Life - 11/01/20 0927       Quality of Life   Select Quality of Life      Quality of Life Scores   Health/Function Pre 18.57 %    Socioeconomic Pre 22.06 %    Psych/Spiritual Pre 24.93 %    Family Pre 28.8 %    GLOBAL Pre 22.1 %            Scores of 19 and below usually indicate a  poorer quality of life in these areas.  A difference of  2-3 points is a clinically meaningful difference.  A difference of 2-3 points in the total score of the Quality of Life Index has been associated with significant improvement in overall quality of life, self-image, physical symptoms, and general health in studies assessing change in quality of life.  PHQ-9: Recent Review Flowsheet Data     Depression screen [facility] 2/9 11/01/2020   Decreased Interest 0   Down, Depressed, Hopeless 0   PHQ - 2 Score 0   Altered sleeping 1   Tired, decreased energy 1   Change  in appetite 0   Feeling bad or failure about yourself  0   Trouble concentrating 1   Moving slowly or fidgety/restless 0   Suicidal thoughts 0   PHQ-9 Score 3   Difficult doing work/chores Somewhat difficult      Interpretation of Total Score  Total Score Depression Severity:  1-4 = Minimal depression, 5-9 = Mild depression, 10-14 = Moderate depression, 15-19 = Moderately severe depression, 20-27 = Severe depression   Psychosocial Evaluation and Intervention:  Psychosocial Evaluation - 10/26/20 0950       Psychosocial Evaluation & Interventions   Interventions Encouraged to exercise with the program and follow exercise prescription;Stress management education    Comments Lynnwood is coming into rehab after his 3rd stent.  This will be his first time through  as he was previously very active and refereeing soccer.  This time, however, he was not able to bounce back as readily and not able to do soccer all season.  He would like to be ready for the fall season.   He has also been struggling with shortness of breath and would like to get that under better control.  He is already back to work in [employer] on the computer.  He has a great support system with his family. He does have a history of depression but is currently well managed on klonopin.  He also uses a BiPap at night routinuely to sleep.  Overall, he is doing well.    Expected  Outcomes Short: Attend rehab to build stamina and improve SOB Long: Continue to stay positive    Continue Psychosocial Services  Follow up required by staff             Psychosocial Re-Evaluation:  Psychosocial Re-Evaluation     [place] Name 11/19/20 0756             Psychosocial Re-Evaluation   Current issues with Current Stress Concerns       Comments Tykel is doing well mentally.  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  He is eager to get back and hoping by fall he can go again. He sleeps pretty good overall. Otherwise, work is stressful but he copes as best he is able.       Expected Outcomes Short: Continue to work towards getting back on the field Long: Continue to stay positive       Interventions Encouraged to attend Cardiac Rehabilitation for the exercise       Continue Psychosocial Services  Follow up required by staff                Psychosocial Discharge (Final Psychosocial Re-Evaluation):  Psychosocial Re-Evaluation - 11/19/20 0756       Psychosocial Re-Evaluation   Current issues with Current Stress Concerns    Comments Varick is doing well mentally.  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  He is eager to get back and hoping by fall he can go again. He sleeps pretty good overall. Otherwise, work is stressful but he copes as best he is able.    Expected Outcomes Short: Continue to work towards getting back on the field Long: Continue to stay positive    Interventions Encouraged to attend Cardiac Rehabilitation for the exercise    Continue Psychosocial Services  Follow up required by staff             Vocational Rehabilitation: Provide vocational rehab assistance to qualifying candidates.   Vocational  Rehab Evaluation & Intervention:  Vocational Rehab - 10/26/20 0941       Initial Vocational Rehab Evaluation & Intervention   Assessment shows need for Vocational Rehabilitation No             Education: Education  Goals: Education classes will be provided on a variety of topics geared toward better understanding of heart health and risk factor modification. Participant will state understanding/return demonstration of topics presented as noted by education test scores.  Learning Barriers/Preferences:  Learning Barriers/Preferences - 10/26/20 0940       Learning Barriers/Preferences   Learning Barriers Sight   glasses   Learning Preferences None             General Cardiac Education Topics:  AED/CPR: - Group verbal and written instruction with the use of models to demonstrate the basic use of the AED with the basic ABC's of resuscitation.   Anatomy and Cardiac Procedures: - Group verbal and visual presentation and models provide information about basic cardiac anatomy and function. Reviews the testing methods done to diagnose heart disease and the outcomes of the test results. Describes the treatment choices: Medical Management, Angioplasty, or Coronary Bypass Surgery for treating various heart conditions including Myocardial Infarction, Angina, Valve Disease, and Cardiac Arrhythmias.  Written material given at graduation. Flowsheet Row Cardiac Rehab from 12/08/2020 in [facility] Cardiac and Pulmonary Rehab  Education need identified 11/01/20  Date 11/24/20  Educator SB  Instruction Review Code 1- Verbalizes Understanding       Medication Safety: - Group verbal and visual instruction to review commonly prescribed medications for heart and lung disease. Reviews the medication, class of the drug, and side effects. Includes the steps to properly store meds and maintain the prescription regimen.  Written material given at graduation.   Intimacy: - Group verbal instruction through game format to discuss how heart and lung disease can affect sexual intimacy. Written material given at graduation.. Flowsheet Row Cardiac Rehab from 12/08/2020 in [facility] Cardiac and Pulmonary Rehab  Date 11/17/20  Educator  [facility]  Instruction Review Code 1- Verbalizes Understanding       Know Your Numbers and Heart Failure: - Group verbal and visual instruction to discuss disease risk factors for cardiac and pulmonary disease and treatment options.  Reviews associated critical values for Overweight/Obesity, Hypertension, Cholesterol, and Diabetes.  Discusses basics of heart failure: signs/symptoms and treatments.  Introduces Heart Failure Zone chart for action plan for heart failure.  Written material given at graduation.   Infection Prevention: - Provides verbal and written material to individual with discussion of infection control including proper hand washing and proper equipment cleaning during exercise session. Flowsheet Row Cardiac Rehab from 12/08/2020 in [facility] Cardiac and Pulmonary Rehab  Date 11/01/20  Educator [facility]  Instruction Review Code 1- Verbalizes Understanding       Falls Prevention: - Provides verbal and written material to individual with discussion of falls prevention and safety. Flowsheet Row Cardiac Rehab from 12/08/2020 in [facility] Cardiac and Pulmonary Rehab  Date 11/01/20  Educator [facility]  Instruction Review Code 1- Verbalizes Understanding       Other: -Provides group and verbal instruction on various topics (see comments)   Knowledge Questionnaire Score:  Knowledge Questionnaire Score - 11/01/20 0928       Knowledge Questionnaire Score   Pre Score 21/26 Education Focus: MI, chest pain, nurtition, exercise             Core Components/Risk Factors/Patient Goals at Admission:  Personal Goals and  Risk Factors at Admission - 11/01/20 0929       Core Components/Risk Factors/Patient Goals on Admission    Weight Management Yes;Weight Loss    Intervention Weight Management: Develop a combined nutrition and exercise program designed to reach desired caloric intake, while maintaining appropriate intake of nutrient and fiber, sodium and fats, and appropriate energy expenditure  required for the weight goal.;Weight Management: Provide education and appropriate resources to help participant work on and attain dietary goals.    Admit Weight 184 lb 4.8 oz (83.6 kg)    Goal Weight: Short Term 180 lb (81.6 kg)    Goal Weight: Long Term 175 lb (79.4 kg)    Expected Outcomes Short Term: Continue to assess and modify interventions until short term weight is achieved;Long Term: Adherence to nutrition and physical activity/exercise program aimed toward attainment of established weight goal;Weight Loss: Understanding of general recommendations for a balanced deficit meal plan, which promotes 1-2 lb weight loss per week and includes a negative energy balance of [phone] kcal/d;Understanding of distribution of calorie intake throughout the day with the consumption of 4-5 meals/snacks;Understanding recommendations for meals to include 15-35% energy as protein, 25-35% energy from fat, 35-60% energy from carbohydrates, less than 278m of dietary cholesterol, 20-35 gm of total fiber daily    Diabetes Yes    Intervention Provide education about signs/symptoms and action to take for hypo/hyperglycemia.;Provide education about proper nutrition, including hydration, and aerobic/resistive exercise prescription along with prescribed medications to achieve blood glucose in normal ranges: Fasting glucose 65-99 mg/dL    Expected Outcomes Short Term: Participant verbalizes understanding of the signs/symptoms and immediate care of hyper/hypoglycemia, proper foot care and importance of medication, aerobic/resistive exercise and nutrition plan for blood glucose control.;Long Term: Attainment of HbA1C < 7%.    Hypertension Yes    Intervention Provide education on lifestyle modifcations including regular physical activity/exercise, weight management, moderate sodium restriction and increased consumption of fresh fruit, vegetables, and low fat dairy, alcohol moderation, and smoking cessation.;Monitor prescription  use compliance.    Expected Outcomes Short Term: Continued assessment and intervention until BP is < 140/930mHG in hypertensive participants. < 130/8073mG in hypertensive participants with diabetes, heart failure or chronic kidney disease.;Long Term: Maintenance of blood pressure at goal levels.    Lipids Yes    Intervention Provide education and support for participant on nutrition & aerobic/resistive exercise along with prescribed medications to achieve LDL <58m27mDL >40mg63m Expected Outcomes Short Term: Participant states understanding of desired cholesterol values and is compliant with medications prescribed. Participant is following exercise prescription and nutrition guidelines.;Long Term: Cholesterol controlled with medications as prescribed, with individualized exercise RX and with personalized nutrition plan. Value goals: LDL < 58mg,82m > 40 mg.             Education:Diabetes - Individual verbal and written instruction to review signs/symptoms of diabetes, desired ranges of glucose level fasting, after meals and with exercise. Acknowledge that pre and post exercise glucose checks will be done for 3 sessions at entry of program. FlowshJeff Davis[facility]  Date 11/01/20  Educator [facility]  [facility] Review Code 1- Verbalizes Understanding       Core Components/Risk Factors/Patient Goals Review:   Goals and Risk Factor Review     Row Name 11/19/20 0754             Core Components/Risk Factors/Patient Goals Review   Personal Goals Review Weight Management/Obesity;Diabetes;Hypertension  Review Satchel is off to a good start in rehab. So far his weight has been staying the same.  He is doing well with his blood pressures and he does check them at home.  His sugars have been going up and down.  He checks them daily in the morning and running between 125-180 mg/dl.  He does try to get in protein at night.       Expected  Outcomes Short: Continue to work on diabetes management with diet.  Long: continue to monitor risk factors.                Core Components/Risk Factors/Patient Goals at Discharge (Final Review):   Goals and Risk Factor Review - 11/19/20 0754       Core Components/Risk Factors/Patient Goals Review   Personal Goals Review Weight Management/Obesity;Diabetes;Hypertension    Review Demarrion is off to a good start in rehab. So far his weight has been staying the same.  He is doing well with his blood pressures and he does check them at home.  His sugars have been going up and down.  He checks them daily in the morning and running between 125-180 mg/dl.  He does try to get in protein at night.    Expected Outcomes Short: Continue to work on diabetes management with diet.  Long: continue to monitor risk factors.             ITP Comments:  ITP Comments     Row Name 10/26/20 0958 11/01/20 0919 11/03/20 0735 11/10/20 0854 11/15/20 1425   ITP Comments Completed virtual orientation today.  EP evaluation is scheduled for Monday 6/6 at 8am.  Documentation for diagnosis can be found in [facility] encounter 10/06/20. Completed 6MWT and gym orientation. Initial ITP created and sent for review to Dr. Emily Filbert, Medical Director. First full day of exercise!  Patient was oriented to gym and equipment including functions, settings, policies, and procedures.  Patient's individual exercise prescription and treatment plan were reviewed.  All starting workloads were established based on the results of the 6 minute walk test done at initial orientation visit.  The plan for exercise progression was also introduced and progression will be customized based on patient's performance and goals. 30 Day review completed. Medical Director ITP review done, changes made as directed, and signed approval by Medical Director.  new to program Completed initial RD consultation    [place] Name 12/08/20 1127           ITP Comments 30 Day  review completed. Medical Director ITP review done, changes made as directed, and signed approval by Medical Director.                Comments:

## 2020-12-10 ENCOUNTER — Other Ambulatory Visit: Payer: Self-pay

## 2020-12-10 ENCOUNTER — Encounter: Payer: 59 | Admitting: *Deleted

## 2020-12-10 DIAGNOSIS — Z955 Presence of coronary angioplasty implant and graft: Secondary | ICD-10-CM

## 2020-12-10 DIAGNOSIS — Z48812 Encounter for surgical aftercare following surgery on the circulatory system: Secondary | ICD-10-CM | POA: Diagnosis not present

## 2020-12-10 NOTE — Progress Notes (Signed)
Daily Session Note  Patient Details  Name: Frank Wong MRN: [id number] Date of Birth: July 01, 1955 Referring Provider:   Flowsheet Row Cardiac Rehab from 11/01/2020 in [facility] Cardiac and Pulmonary Rehab  Referring Provider [name] MD       Encounter Date: 12/10/2020  Check In:  Session Check In - 12/10/20 0823       Check-In   Supervising physician immediately available to respond to emergencies See telemetry face sheet for immediately available ER MD    Location [facility]-Cardiac & Pulmonary Rehab    Staff Present Heath Lark, RN, BSN, CCRP;Melissa [place], RDN, LDN;Joseph [place], [place]    Virtual Visit No    Medication changes reported     No    Fall or balance concerns reported    No    Warm-up and Cool-down Performed on first and last piece of equipment    Resistance Training Performed Yes    VAD Patient? No    PAD/SET Patient? No      Pain Assessment   Currently in Pain? No/denies                Social History   Tobacco Use  Smoking Status Former  Smokeless Tobacco Former    Goals Met:  Independence with exercise equipment Exercise tolerated well No report of cardiac concerns or symptoms  Goals Unmet:  Not Applicable  Comments: Pt able to follow exercise prescription today without complaint.  Will continue to monitor for progression.    Dr. Emily Filbert is Medical Director for [place].  Dr. Ottie Glazier is Medical Director for [facility] Pulmonary Rehabilitation.

## 2020-12-13 ENCOUNTER — Encounter: Payer: 59 | Admitting: *Deleted

## 2020-12-13 ENCOUNTER — Other Ambulatory Visit: Payer: Self-pay

## 2020-12-13 DIAGNOSIS — Z48812 Encounter for surgical aftercare following surgery on the circulatory system: Secondary | ICD-10-CM | POA: Diagnosis not present

## 2020-12-13 DIAGNOSIS — Z955 Presence of coronary angioplasty implant and graft: Secondary | ICD-10-CM

## 2020-12-13 NOTE — Progress Notes (Signed)
Daily Session Note  Patient Details  Name: Frank Wong MRN: [id number] Date of Birth: 01/02/1956 Referring Provider:   Flowsheet Row Cardiac Rehab from 11/01/2020 in [facility] Cardiac and Pulmonary Rehab  Referring Provider Paraschos, Alexander MD       Encounter Date: 12/13/2020  Check In:  Session Check In - 12/13/20 0924       Check-In   Supervising physician immediately available to respond to emergencies See telemetry face sheet for immediately available ER MD    Location [facility]-Cardiac & Pulmonary Rehab    Staff Present Susanne Bice, RN, BSN, CCRP;Kelly Hayes, BS, ACSM CEP, Exercise Physiologist;Joseph [place], RCP,RRT,BSRT    Virtual Visit No    Medication changes reported     No    Fall or balance concerns reported    No    Warm-up and Cool-down Performed on first and last piece of equipment    Resistance Training Performed Yes    VAD Patient? No    PAD/SET Patient? No      Pain Assessment   Currently in Pain? No/denies                Social History   Tobacco Use  Smoking Status Former  Smokeless Tobacco Former    Goals Met:  Independence with exercise equipment Exercise tolerated well No report of cardiac concerns or symptoms  Goals Unmet:  Not Applicable  Comments: Pt able to follow exercise prescription today without complaint.  Will continue to monitor for progression.    Dr. Mark Miller is Medical Director for [facility] Cardiac Rehabilitation.  Dr. Fuad Aleskerov is Medical Director for [facility] Pulmonary Rehabilitation. 

## 2020-12-15 ENCOUNTER — Other Ambulatory Visit: Payer: Self-pay

## 2020-12-15 DIAGNOSIS — Z48812 Encounter for surgical aftercare following surgery on the circulatory system: Secondary | ICD-10-CM | POA: Diagnosis not present

## 2020-12-15 DIAGNOSIS — Z955 Presence of coronary angioplasty implant and graft: Secondary | ICD-10-CM

## 2020-12-15 NOTE — Progress Notes (Signed)
Daily Session Note  Patient Details  Name: Frank Wong MRN: [id number] Date of Birth: [birth year] Referring Provider:   Flowsheet Row Cardiac Rehab from 11/01/2020 in [facility] Cardiac and Pulmonary Rehab  Referring Provider [name] MD       Encounter Date: 12/15/2020  Check In:  Session Check In - 12/15/20 0720       Check-In   Supervising physician immediately available to respond to emergencies See telemetry face sheet for immediately available ER MD    Location [facility]-Cardiac & Pulmonary Rehab    Staff Present Birdie Sons, MPA, RN;Joseph Tessie Fass, RCP,RRT,BSRT;Amanda Oletta Darter, [place], ACSM CEP, Exercise Physiologist    Virtual Visit No    Medication changes reported     No    Fall or balance concerns reported    No    Warm-up and Cool-down Performed on first and last piece of equipment    Resistance Training Performed Yes    VAD Patient? No    PAD/SET Patient? No      Pain Assessment   Currently in Pain? No/denies                Social History   Tobacco Use  Smoking Status Former  Smokeless Tobacco Former    Goals Met:  Independence with exercise equipment Exercise tolerated well No report of cardiac concerns or symptoms Strength training completed today  Goals Unmet:  Not Applicable  Comments: Pt able to follow exercise prescription today without complaint.  Will continue to monitor for progression.    Dr. Emily Filbert is Medical Director for [place].  Dr. Ottie Glazier is Medical Director for [facility] Pulmonary Rehabilitation.

## 2020-12-17 ENCOUNTER — Encounter: Payer: 59 | Admitting: *Deleted

## 2020-12-17 ENCOUNTER — Other Ambulatory Visit: Payer: Self-pay

## 2020-12-17 DIAGNOSIS — Z955 Presence of coronary angioplasty implant and graft: Secondary | ICD-10-CM

## 2020-12-17 DIAGNOSIS — Z48812 Encounter for surgical aftercare following surgery on the circulatory system: Secondary | ICD-10-CM | POA: Diagnosis not present

## 2020-12-17 NOTE — Progress Notes (Signed)
Daily Session Note  Patient Details  Name: Frank Wong MRN: [id number] Date of Birth: 1956-05-14 Referring Provider:   Flowsheet Row Cardiac Rehab from 11/01/2020 in [facility] Cardiac and Pulmonary Rehab  Referring Provider [name] MD       Encounter Date: 12/17/2020  Check In:  Session Check In - 12/17/20 0738       Check-In   Supervising physician immediately available to respond to emergencies See telemetry face sheet for immediately available ER MD    Location [facility]-Cardiac & Pulmonary Rehab    Staff Present Heath Lark, RN, BSN, CCRP;Jessica [place], MA, RCEP, CCRP, CCET;Joseph [place], [place]    Virtual Visit No    Medication changes reported     No    Fall or balance concerns reported    No    Warm-up and Cool-down Performed on first and last piece of equipment    Resistance Training Performed Yes    VAD Patient? No    PAD/SET Patient? No      Pain Assessment   Currently in Pain? No/denies                Social History   Tobacco Use  Smoking Status Former  Smokeless Tobacco Former    Goals Met:  Independence with exercise equipment Exercise tolerated well No report of cardiac concerns or symptoms  Goals Unmet:  Not Applicable  Comments: Pt able to follow exercise prescription today without complaint.  Will continue to monitor for progression.    Dr. Emily Filbert is Medical Director for [place].  Dr. Ottie Glazier is Medical Director for [facility] Pulmonary Rehabilitation.

## 2020-12-20 ENCOUNTER — Other Ambulatory Visit: Payer: Self-pay

## 2020-12-20 ENCOUNTER — Encounter: Payer: 59 | Admitting: *Deleted

## 2020-12-20 DIAGNOSIS — Z48812 Encounter for surgical aftercare following surgery on the circulatory system: Secondary | ICD-10-CM | POA: Diagnosis not present

## 2020-12-20 DIAGNOSIS — Z955 Presence of coronary angioplasty implant and graft: Secondary | ICD-10-CM

## 2020-12-20 NOTE — Progress Notes (Signed)
Daily Session Note  Patient Details  Name: Frank Wong MRN: [id number] Date of Birth: [birth year] Referring Provider:   Flowsheet Row Cardiac Rehab from 11/01/2020 in [facility] Cardiac and Pulmonary Rehab  Referring Provider [name] MD       Encounter Date: 12/20/2020  Check In:  Session Check In - 12/20/20 0742       Check-In   Supervising physician immediately available to respond to emergencies See telemetry face sheet for immediately available ER MD    Location [facility]-Cardiac & Pulmonary Rehab    Staff Present Heath Lark, RN, BSN, [place], BS, ACSM CEP, Exercise Physiologist;Joseph [place], [place]    Virtual Visit No    Medication changes reported     No    Fall or balance concerns reported    No    Warm-up and Cool-down Performed on first and last piece of equipment    Resistance Training Performed Yes    VAD Patient? No    PAD/SET Patient? No      Pain Assessment   Currently in Pain? No/denies                Social History   Tobacco Use  Smoking Status Former  Smokeless Tobacco Former    Goals Met:  Independence with exercise equipment Exercise tolerated well No report of cardiac concerns or symptoms  Goals Unmet:  Not Applicable  Comments: Pt able to follow exercise prescription today without complaint.  Will continue to monitor for progression.    Dr. Emily Filbert is Medical Director for [place].  Dr. Ottie Glazier is Medical Director for [facility] Pulmonary Rehabilitation.

## 2020-12-22 ENCOUNTER — Other Ambulatory Visit: Payer: Self-pay

## 2020-12-22 DIAGNOSIS — Z48812 Encounter for surgical aftercare following surgery on the circulatory system: Secondary | ICD-10-CM | POA: Diagnosis not present

## 2020-12-22 DIAGNOSIS — Z955 Presence of coronary angioplasty implant and graft: Secondary | ICD-10-CM

## 2020-12-22 NOTE — Progress Notes (Signed)
Daily Session Note  Patient Details  Name: Frank Wong MRN: [id number] Date of Birth: 02/25/1956 Referring Provider:   Flowsheet Row Cardiac Rehab from 11/01/2020 in [facility] Cardiac and Pulmonary Rehab  Referring Provider [name] MD       Encounter Date: 12/22/2020  Check In:  Session Check In - 12/22/20 0724       Check-In   Supervising physician immediately available to respond to emergencies See telemetry face sheet for immediately available ER MD    Location [facility]-Cardiac & Pulmonary Rehab    Staff Present Birdie Sons, MPA, Elveria Rising, BA, ACSM CEP, Exercise Physiologist;Joseph Tessie Fass, [place]    Virtual Visit No    Medication changes reported     No    Fall or balance concerns reported    No    Warm-up and Cool-down Performed on first and last piece of equipment    Resistance Training Performed Yes    VAD Patient? No    PAD/SET Patient? No      Pain Assessment   Currently in Pain? No/denies                Social History   Tobacco Use  Smoking Status Former  Smokeless Tobacco Former    Goals Met:  Independence with exercise equipment Exercise tolerated well No report of cardiac concerns or symptoms Strength training completed today  Goals Unmet:  Not Applicable  Comments: Pt able to follow exercise prescription today without complaint.  Will continue to monitor for progression.    Dr. Emily Filbert is Medical Director for [place].  Dr. Ottie Glazier is Medical Director for [facility] Pulmonary Rehabilitation.

## 2020-12-24 ENCOUNTER — Other Ambulatory Visit: Payer: Self-pay

## 2020-12-24 ENCOUNTER — Encounter: Payer: 59 | Admitting: *Deleted

## 2020-12-24 DIAGNOSIS — Z48812 Encounter for surgical aftercare following surgery on the circulatory system: Secondary | ICD-10-CM | POA: Diagnosis not present

## 2020-12-24 DIAGNOSIS — Z955 Presence of coronary angioplasty implant and graft: Secondary | ICD-10-CM

## 2020-12-24 NOTE — Progress Notes (Signed)
Daily Session Note  Patient Details  Name: Frank Wong MRN: [id number] Date of Birth: 20-Jul-1955 Referring Provider:   Flowsheet Row Cardiac Rehab from 11/01/2020 in [facility] Cardiac and Pulmonary Rehab  Referring Provider [name] MD       Encounter Date: 12/24/2020  Check In:  Session Check In - 12/24/20 0723       Check-In   Supervising physician immediately available to respond to emergencies See telemetry face sheet for immediately available ER MD    Location [facility]-Cardiac & Pulmonary Rehab    Staff Present Heath Lark, RN, BSN, CCRP;Joseph McCammon, RCP,RRT,BSRT;Amanda [place], [place], ACSM CEP, Exercise Physiologist    Virtual Visit No    Medication changes reported     No    Fall or balance concerns reported    No    Warm-up and Cool-down Performed on first and last piece of equipment    Resistance Training Performed Yes    VAD Patient? No    PAD/SET Patient? No      Pain Assessment   Currently in Pain? No/denies                Social History   Tobacco Use  Smoking Status Former  Smokeless Tobacco Former    Goals Met:  Independence with exercise equipment Exercise tolerated well No report of cardiac concerns or symptoms  Goals Unmet:  Not Applicable  Comments: Pt able to follow exercise prescription today without complaint.  Will continue to monitor for progression.    Dr. Emily Filbert is Medical Director for [place].  Dr. Ottie Glazier is Medical Director for [facility] Pulmonary Rehabilitation.

## 2020-12-27 ENCOUNTER — Encounter: Payer: 59 | Attending: Cardiology | Admitting: *Deleted

## 2020-12-27 ENCOUNTER — Other Ambulatory Visit: Payer: Self-pay

## 2020-12-27 DIAGNOSIS — Z955 Presence of coronary angioplasty implant and graft: Secondary | ICD-10-CM | POA: Diagnosis present

## 2020-12-27 NOTE — Progress Notes (Signed)
Daily Session Note  Patient Details  Name: Frank Wong MRN: [id number] Date of Birth: Jun 26, 1955 Referring Provider:   Flowsheet Row Cardiac Rehab from 11/01/2020 in [facility] Cardiac and Pulmonary Rehab  Referring Provider [name] MD       Encounter Date: 12/27/2020  Check In:  Session Check In - 12/27/20 0802       Check-In   Supervising physician immediately available to respond to emergencies See telemetry face sheet for immediately available ER MD    Location [facility]-Cardiac & Pulmonary Rehab    Staff Present Heath Lark, RN, BSN, Laveda Norman, BS, ACSM CEP, Exercise Physiologist;Joseph [place], [place]    Virtual Visit No    Medication changes reported     No    Fall or balance concerns reported    No    Warm-up and Cool-down Performed on first and last piece of equipment    Resistance Training Performed Yes    VAD Patient? No    PAD/SET Patient? No      Pain Assessment   Currently in Pain? No/denies                Social History   Tobacco Use  Smoking Status Former  Smokeless Tobacco Former    Goals Met:  Independence with exercise equipment Exercise tolerated well No report of cardiac concerns or symptoms  Goals Unmet:  Not Applicable  Comments: Pt able to follow exercise prescription today without complaint.  Will continue to monitor for progression.    Dr. Emily Filbert is Medical Director for [place].  Dr. Ottie Glazier is Medical Director for [facility] Pulmonary Rehabilitation.

## 2020-12-29 ENCOUNTER — Other Ambulatory Visit: Payer: Self-pay

## 2020-12-29 DIAGNOSIS — Z955 Presence of coronary angioplasty implant and graft: Secondary | ICD-10-CM

## 2020-12-29 NOTE — Progress Notes (Signed)
Daily Session Note  Patient Details  Name: DJANGO NGUYEN MRN: [id number] Date of Birth: 06/04/1955 Referring Provider:   Flowsheet Row Cardiac Rehab from 11/01/2020 in [facility] Cardiac and Pulmonary Rehab  Referring Provider [name] MD       Encounter Date: 12/29/2020  Check In:  Session Check In - 12/29/20 0947       Check-In   Supervising physician immediately available to respond to emergencies See telemetry face sheet for immediately available ER MD    Location [facility]-Cardiac & Pulmonary Rehab    Staff Present Hope Budds, RDN, Rowe Pavy, BA, ACSM CEP, Exercise Physiologist;Dajae Kizer, RN,BC,MSN    Virtual Visit No    Medication changes reported     No    Fall or balance concerns reported    No    Warm-up and Cool-down Performed on first and last piece of equipment    Resistance Training Performed Yes    VAD Patient? No    PAD/SET Patient? No      Pain Assessment   Currently in Pain? No/denies    Multiple Pain Sites No                Social History   Tobacco Use  Smoking Status Former  Smokeless Tobacco Former    Goals Met:  Independence with exercise equipment Exercise tolerated well No report of cardiac concerns or symptoms  Goals Unmet:  Not Applicable  Comments: Pt able to follow exercise prescription today without complaint.  Will continue to monitor for progression.    Dr. Emily Filbert is Medical Director for [place].  Dr. Ottie Glazier is Medical Director for [facility] Pulmonary Rehabilitation.

## 2020-12-31 ENCOUNTER — Other Ambulatory Visit: Payer: Self-pay

## 2020-12-31 ENCOUNTER — Encounter: Payer: 59 | Admitting: *Deleted

## 2020-12-31 DIAGNOSIS — Z955 Presence of coronary angioplasty implant and graft: Secondary | ICD-10-CM | POA: Diagnosis not present

## 2020-12-31 NOTE — Progress Notes (Signed)
Daily Session Note  Patient Details  Name: Frank Wong MRN: [id number] Date of Birth: [birth year] Referring Provider:   Flowsheet Row Cardiac Rehab from 11/01/2020 in [facility] Cardiac and Pulmonary Rehab  Referring Provider [name] MD       Encounter Date: 12/31/2020  Check In:  Session Check In - 12/31/20 0814       Check-In   Supervising physician immediately available to respond to emergencies See telemetry face sheet for immediately available ER MD    Location [facility]-Cardiac & Pulmonary Rehab    Staff Present Heath Lark, RN, BSN, CCRP;Jessica [place], MA, RCEP, CCRP, CCET;Joseph [place], [place]    Virtual Visit No    Medication changes reported     No    Fall or balance concerns reported    No    Warm-up and Cool-down Performed on first and last piece of equipment    Resistance Training Performed Yes    VAD Patient? No    PAD/SET Patient? No      Pain Assessment   Currently in Pain? No/denies                Social History   Tobacco Use  Smoking Status Former  Smokeless Tobacco Former    Goals Met:  Independence with exercise equipment Exercise tolerated well No report of cardiac concerns or symptoms  Goals Unmet:  Not Applicable  Comments: Pt able to follow exercise prescription today without complaint.  Will continue to monitor for progression.    Dr. Emily Filbert is Medical Director for [place].  Dr. Ottie Glazier is Medical Director for [facility] Pulmonary Rehabilitation.

## 2021-01-03 ENCOUNTER — Encounter: Payer: 59 | Admitting: *Deleted

## 2021-01-03 ENCOUNTER — Other Ambulatory Visit: Payer: Self-pay

## 2021-01-03 DIAGNOSIS — Z955 Presence of coronary angioplasty implant and graft: Secondary | ICD-10-CM | POA: Diagnosis not present

## 2021-01-03 NOTE — Patient Instructions (Signed)
Discharge Patient Instructions  Patient Details  Name: Frank Wong MRN: [id number] Date of Birth: 1955/09/08 Referring Provider:  [name], MD   Number of Visits: 60  Reason for Discharge:  Patient reached a stable level of exercise. Patient independent in their exercise. Patient has met program and personal goals.  Smoking History:  Social History   Tobacco Use  Smoking Status Former  Smokeless Tobacco Former    Diagnosis:  Status post coronary artery stent placement  Initial Exercise Prescription:  Initial Exercise Prescription - 11/01/20 0900       Date of Initial Exercise RX and Referring Provider   Date 11/01/20    Referring Provider Paraschos, Alexander MD      Treadmill   MPH 2.7    Grade 1    Minutes 15    METs 3.44      Recumbant Bike   Level 3    RPM 50    Watts 47    Minutes 15    METs 3.5      REL-XR   Level 3    Speed 50    Minutes 15    METs 3.5      Track   Laps 40    Minutes 15    METs 3.2      Prescription Details   Frequency (times per week) 3    Duration Progress to 30 minutes of continuous aerobic without signs/symptoms of physical distress      Intensity   THRR 40-80% of Max Heartrate 108-141    Ratings of Perceived Exertion 11-13    Perceived Dyspnea 0-4      Progression   Progression Continue to progress workloads to maintain intensity without signs/symptoms of physical distress.      Resistance Training   Training Prescription Yes    Weight 4 lb    Reps 10-15             Discharge Exercise Prescription (Final Exercise Prescription Changes):  Exercise Prescription Changes - 12/20/20 1500       Response to Exercise   Blood Pressure (Admit) 106/64    Blood Pressure (Exit) 126/60    Heart Rate (Admit) 72 bpm    Heart Rate (Exercise) 142 bpm    Heart Rate (Exit) 116 bpm    Oxygen Saturation (Admit) 95 %    Oxygen Saturation (Exercise) 94 %    Oxygen Saturation (Exit) 95 %    Rating of  Perceived Exertion (Exercise) 15    Symptoms none    Duration Continue with 30 min of aerobic exercise without signs/symptoms of physical distress.    Intensity THRR unchanged      Progression   Progression Continue to progress workloads to maintain intensity without signs/symptoms of physical distress.    Average METs 6.65      Resistance Training   Training Prescription Yes    Weight 12 lb    Reps 10-15      Interval Training   Interval Training No      Treadmill   MPH 3.8    Grade 12    Minutes 15    METs 10.8      Recumbant Bike   Level 7    Watts 47    Minutes 15    METs 3.89      REL-XR   Level 10    Minutes 15    METs 5.3      Home Exercise Plan   Plans to  continue exercise at [employer] (comment)   [employer]   Frequency Add 2 additional days to program exercise sessions.    Initial Home Exercises Provided 11/19/20             Functional Capacity:  6 Minute Walk     Row Name 11/01/20 0920 12/29/20 0904       6 Minute Walk   Phase Initial Discharge    Distance 1455 feet 1820 feet    Distance % Change -- 25 %    Walk Time 6 minutes 6 minutes    # of Rest Breaks 0 0    MPH 2.76 3.44    METS 3.78 4.67    RPE 9 10    Perceived Dyspnea  1 0    VO2 Peak 13.22 16.35    Symptoms No No    Resting HR 74 bpm 67 bpm    Resting BP 134/72 104/54    Resting Oxygen Saturation  98 % 96 %    Exercise Oxygen Saturation  during 6 min walk 98 % 93 %    Max Ex. HR 110 bpm 117 bpm    Max Ex. BP 136/70 160/68    2 Minute Post BP 114/66 --               Nutrition:  Nutrition Therapy & Goals - 11/15/20 1334       Nutrition Therapy   Diet Heart healthy, low Na    Protein (specify units) 65g    Fiber 30 grams    Whole Grain Foods 3 servings    Saturated Fats 12 max. grams    Fruits and Vegetables 8 servings/day    Sodium 1.5 grams      Personal Nutrition Goals   Nutrition Goal ST: honor hunger - this may mean including more satisfying snacks  LT: manage portion sizes of dinner meal    Comments FODMAPs - recommended by gastroenterologist, waiting to see what the colonoscopy results. Colonoscopy in Augusts. B: special K cereal (almond milk) S: carrots/celery L: sandwich ([place], ham, or chicken with pickles on whole wheat bread) with some chips like doritos S: carrots ans celery D: limiting fast food - he gets take out from restaurants. He will have vegetables and meatloaf, chicken and dumplings, 6 oz steak. He reports cooking some salmon with grilled mixed vegetables. Will also have an apple a day. Drinks: water, coffee - black , propel or gateraide mixed drinks 1x/day. Discussed heart healthy eating.      Intervention Plan   Intervention Prescribe, educate and counsel regarding individualized specific dietary modifications aiming towards targeted core components such as weight, hypertension, lipid management, diabetes, heart failure and other comorbidities.    Expected Outcomes Short Term Goal: Understand basic principles of dietary content, such as calories, fat, sodium, cholesterol and nutrients.;Short Term Goal: A plan has been developed with personal nutrition goals set during dietitian appointment.;Long Term Goal: Adherence to prescribed nutrition plan.           Goals reviewed with patient; copy given to patient.

## 2021-01-03 NOTE — Progress Notes (Signed)
Daily Session Note  Patient Details  Name: Frank Wong MRN: [id number] Date of Birth: 07-14-1955 Referring Provider:   Flowsheet Row Cardiac Rehab from 11/01/2020 in [facility] Cardiac and Pulmonary Rehab  Referring Provider [name] MD       Encounter Date: 01/03/2021  Check In:  Session Check In - 01/03/21 0834       Check-In   Supervising physician immediately available to respond to emergencies See telemetry face sheet for immediately available ER MD    Location [facility]-Cardiac & Pulmonary Rehab    Staff Present Heath Lark, RN, BSN, Lance Sell, BA, ACSM CEP, Exercise Physiologist;Kelly Amedeo Plenty, BS, ACSM CEP, Exercise Physiologist    Virtual Visit No    Medication changes reported     No    Fall or balance concerns reported    No    Warm-up and Cool-down Performed on first and last piece of equipment    Resistance Training Performed Yes    VAD Patient? No    PAD/SET Patient? No      Pain Assessment   Currently in Pain? No/denies                Social History   Tobacco Use  Smoking Status Former  Smokeless Tobacco Former    Goals Met:  Independence with exercise equipment Exercise tolerated well No report of cardiac concerns or symptoms  Goals Unmet:  Not Applicable  Comments: Pt able to follow exercise prescription today without complaint.  Will continue to monitor for progression.    Dr. Emily Filbert is Medical Director for [place].  Dr. Ottie Glazier is Medical Director for [facility] Pulmonary Rehabilitation.

## 2021-01-05 ENCOUNTER — Encounter: Payer: Self-pay | Admitting: *Deleted

## 2021-01-05 ENCOUNTER — Other Ambulatory Visit: Payer: Self-pay

## 2021-01-05 VITALS — Wt 184.9 lb

## 2021-01-05 DIAGNOSIS — Z955 Presence of coronary angioplasty implant and graft: Secondary | ICD-10-CM | POA: Diagnosis not present

## 2021-01-05 NOTE — Progress Notes (Signed)
Cardiac Individual Treatment Plan  Patient Details  Name: Frank Wong MRN: [id number] Date of Birth: [birth year] Referring Provider:   Flowsheet Row Cardiac Rehab from 11/01/2020 in [facility] Cardiac and Pulmonary Rehab  Referring Provider [name] MD       Initial Encounter Date:  Flowsheet Row Cardiac Rehab from 11/01/2020 in [facility] Cardiac and Pulmonary Rehab  Date 11/01/20       Visit Diagnosis: Status post coronary artery stent placement  Patient's Home Medications on Admission:  Current Outpatient Medications:    aspirin EC 81 MG tablet, Take 81 mg by mouth in the morning., Disp: , Rfl:    atorvastatin (LIPITOR) 80 MG tablet, Take 80 mg by mouth in the morning., Disp: , Rfl:    B Complex-C (B-COMPLEX WITH VITAMIN C) tablet, Take 1 tablet by mouth in the morning., Disp: , Rfl:    benazepril (LOTENSIN) 20 MG tablet, Take 20 mg by mouth in the morning., Disp: , Rfl:    CINNAMON PO, Take 1,000 mg by mouth in the morning and at bedtime., Disp: , Rfl:    clonazePAM (KLONOPIN) 2 MG tablet, Take 1 mg by mouth at bedtime as needed (sleep/ anxiety)., Disp: , Rfl:    clopidogrel (PLAVIX) 75 MG tablet, Take 75 mg by mouth in the morning., Disp: , Rfl:    dicyclomine (BENTYL) 10 MG capsule, Take 10 mg by mouth 4 (four) times daily as needed for spasms., Disp: , Rfl:    EPINEPHrine 0.3 mg/0.3 mL IJ SOAJ injection, Inject 0.3 mg into the muscle as needed for anaphylaxis., Disp: , Rfl:    ferrous sulfate 325 (65 FE) MG tablet, Take 325 mg by mouth in the morning., Disp: , Rfl:    fexofenadine (ALLEGRA) 180 MG tablet, Take 180 mg by mouth in the morning., Disp: , Rfl:    gabapentin (NEURONTIN) 600 MG tablet, Take 300 mg by mouth at bedtime., Disp: , Rfl:    GAVILYTE-G 236 g solution, Take 4,000 mLs by mouth as directed., Disp: , Rfl:    glipiZIDE (GLUCOTROL) 10 MG tablet, Take 10 mg by mouth every morning., Disp: , Rfl:    hyoscyamine (LEVSIN SL) 0.125 MG SL tablet, Place 0.125 mg under  the tongue every 6 (six) hours as needed (abdominal spasms)., Disp: , Rfl:    JARDIANCE 25 MG TABS tablet, Take 25 mg by mouth in the morning., Disp: , Rfl:    levocetirizine (XYZAL) 5 MG tablet, Take 5 mg by mouth every evening., Disp: , Rfl:    magnesium oxide (MAG-OX) 400 MG tablet, Take 400 mg by mouth in the morning., Disp: , Rfl:    melatonin 5 MG TABS, Take 5 mg by mouth at bedtime as needed (sleep)., Disp: , Rfl:    metFORMIN (GLUCOPHAGE) 1000 MG tablet, Take 1,000 mg by mouth 2 (two) times daily with a meal. , Disp: , Rfl:    montelukast (SINGULAIR) 10 MG tablet, Take 10 mg by mouth at bedtime., Disp: , Rfl:    Multiple Vitamin (MULTIVITAMIN WITH MINERALS) TABS tablet, Take 1 tablet by mouth in the morning., Disp: , Rfl:    pantoprazole (PROTONIX) 40 MG tablet, Take 40 mg by mouth at bedtime., Disp: , Rfl:    Peppermint Oil (IBGARD PO), Take 2 tablets by mouth in the morning and at bedtime., Disp: , Rfl:    psyllium (METAMUCIL SMOOTH TEXTURE) 28 % packet, Take 1 packet by mouth in the morning., Disp: , Rfl:    sildenafil (REVATIO)  20 MG tablet, Take 10 mg by mouth in the morning and at bedtime., Disp: , Rfl:    Testosterone 20.25 MG/ACT (1.62%) GEL, Apply 2 Pump topically in the morning. Applied to shoulders., Disp: , Rfl:    triamcinolone cream (KENALOG) 0.1 %, Apply 1 application topically 2 (two) times daily as needed (skin irritation/itching)., Disp: , Rfl:    umeclidinium-vilanterol (ANORO ELLIPTA) 62.5-25 MCG/INH AEPB, Inhale 1 puff into the lungs in the morning., Disp: , Rfl:   Past Medical History: Past Medical History:  Diagnosis Date   Asthma    CAD (coronary artery disease)    Cardiac arrest ([place])    Diabetes mellitus without complication ([place])    GERD (gastroesophageal reflux disease)    Heart attack ([place])    Hypertension    Shoulder problem 2010   right   Sleep apnea     Tobacco Use: Social History   Tobacco Use  Smoking Status Former  Smokeless Tobacco  Former    Labs: Recent Review Flowsheet Data   There is no flowsheet data to display.      Exercise Target Goals: Exercise Program Goal: Individual exercise prescription set using results from initial 6 min walk test and THRR while considering  patient's activity barriers and safety.   Exercise Prescription Goal: Initial exercise prescription builds to 30-45 minutes a day of aerobic activity, 2-3 days per week.  Home exercise guidelines will be given to patient during program as part of exercise prescription that the participant will acknowledge.   Education: Aerobic Exercise: - Group verbal and visual presentation on the components of exercise prescription. Introduces F.I.T.T principle from ACSM for exercise prescriptions.  Reviews F.I.T.T. principles of aerobic exercise including progression. Written material given at graduation. Flowsheet Row Cardiac Rehab from 12/29/2020 in [facility] Cardiac and Pulmonary Rehab  Education need identified 11/01/20  Date 11/17/20  Educator [facility]  Instruction Review Code 1- Verbalizes Understanding       Education: Resistance Exercise: - Group verbal and visual presentation on the components of exercise prescription. Introduces F.I.T.T principle from ACSM for exercise prescriptions  Reviews F.I.T.T. principles of resistance exercise including progression. Written material given at graduation. Flowsheet Row Cardiac Rehab from 12/29/2020 in [facility] Cardiac and Pulmonary Rehab  Date 11/24/20  Educator [facility]  Instruction Review Code 1- Verbalizes Understanding        Education: Exercise & Equipment Safety: - Individual verbal instruction and demonstration of equipment use and safety with use of the equipment. Flowsheet Row Cardiac Rehab from 12/29/2020 in [facility] Cardiac and Pulmonary Rehab  Date 11/01/20  Educator [facility]  Instruction Review Code 1- Verbalizes Understanding       Education: Exercise Physiology & General Exercise Guidelines: - Group verbal and  written instruction with models to review the exercise physiology of the cardiovascular system and associated critical values. Provides general exercise guidelines with specific guidelines to those with heart or lung disease.  Flowsheet Row Cardiac Rehab from 12/29/2020 in [facility] Cardiac and Pulmonary Rehab  Date 11/10/20  Educator [facility]  Instruction Review Code 1- Verbalizes Understanding       Education: Flexibility, Balance, Mind/Body Relaxation: - Group verbal and visual presentation with interactive activity on the components of exercise prescription. Introduces F.I.T.T principle from ACSM for exercise prescriptions. Reviews F.I.T.T. principles of flexibility and balance exercise training including progression. Also discusses the mind body connection.  Reviews various relaxation techniques to help reduce and manage stress (i.e. Deep breathing, progressive muscle relaxation, and visualization). Balance handout provided to take  home. Written material given at graduation. Flowsheet Row Cardiac Rehab from 12/29/2020 in [facility] Cardiac and Pulmonary Rehab  Date 12/01/20  Educator [facility]  Instruction Review Code 1- Verbalizes Understanding       Activity Barriers & Risk Stratification:  Activity Barriers & Cardiac Risk Stratification - 11/01/20 0920       Activity Barriers & Cardiac Risk Stratification   Activity Barriers Muscular Weakness;Deconditioning;Joint Problems;Other (comment);Back Problems    Comments hx of sports injuries, played football and refereed soccer    Cardiac Risk Stratification Moderate             6 Minute Walk:  6 Minute Walk     Row Name 11/01/20 0920 12/29/20 0904       6 Minute Walk   Phase Initial Discharge    Distance 1455 feet 1820 feet    Distance % Change -- 25 %    Walk Time 6 minutes 6 minutes    # of Rest Breaks 0 0    MPH 2.76 3.44    METS 3.78 4.67    RPE 9 10    Perceived Dyspnea  1 0    VO2 Peak 13.22 16.35    Symptoms No No    Resting HR 74  bpm 67 bpm    Resting BP 134/72 104/54    Resting Oxygen Saturation  98 % 96 %    Exercise Oxygen Saturation  during 6 min walk 98 % 93 %    Max Ex. HR 110 bpm 117 bpm    Max Ex. BP 136/70 160/68    2 Minute Post BP 114/66 --             Oxygen Initial Assessment:   Oxygen Re-Evaluation:   Oxygen Discharge (Final Oxygen Re-Evaluation):   Initial Exercise Prescription:  Initial Exercise Prescription - 11/01/20 0900       Date of Initial Exercise RX and Referring Provider   Date 11/01/20    Referring Provider Paraschos, Alexander MD      Treadmill   MPH 2.7    Grade 1    Minutes 15    METs 3.44      Recumbant Bike   Level 3    RPM 50    Watts 47    Minutes 15    METs 3.5      REL-XR   Level 3    Speed 50    Minutes 15    METs 3.5      Track   Laps 40    Minutes 15    METs 3.2      Prescription Details   Frequency (times per week) 3    Duration Progress to 30 minutes of continuous aerobic without signs/symptoms of physical distress      Intensity   THRR 40-80% of Max Heartrate 108-141    Ratings of Perceived Exertion 11-13    Perceived Dyspnea 0-4      Progression   Progression Continue to progress workloads to maintain intensity without signs/symptoms of physical distress.      Resistance Training   Training Prescription Yes    Weight 4 lb    Reps 10-15             Perform Capillary Blood Glucose checks as needed.  Exercise Prescription Changes:   Exercise Prescription Changes     Row Name 11/01/20 0900 11/10/20 0700 11/19/20 0700 11/22/20 1400 12/06/20 1000     Response to Exercise  Blood Pressure (Admit) 134/72 114/70 -- 122/62 132/64   Blood Pressure (Exercise) 136/70 132/82 -- 106/64 --   Blood Pressure (Exit) 114/66 98/58 -- 122/80 122/62   Heart Rate (Admit) 74 bpm 75 bpm -- 78 bpm 61 bpm   Heart Rate (Exercise) 110 bpm 128 bpm -- 142 bpm 138 bpm   Heart Rate (Exit) 87 bpm 104 bpm -- 128 bpm 119 bpm   Oxygen Saturation  (Admit) 98 % -- -- -- --   Oxygen Saturation (Exercise) 98 % -- -- -- --   Rating of Perceived Exertion (Exercise) 9 12 -- 14 14   Perceived Dyspnea (Exercise) 1 -- -- -- --   Symptoms SOB -- -- none none   Comments walk test results -- -- -- --   Duration -- Progress to 30 minutes of  aerobic without signs/symptoms of physical distress -- Continue with 30 min of aerobic exercise without signs/symptoms of physical distress. Continue with 30 min of aerobic exercise without signs/symptoms of physical distress.   Intensity -- THRR unchanged -- THRR unchanged THRR unchanged     Progression   Progression -- Continue to progress workloads to maintain intensity without signs/symptoms of physical distress. -- Continue to progress workloads to maintain intensity without signs/symptoms of physical distress. Continue to progress workloads to maintain intensity without signs/symptoms of physical distress.   Average METs -- 4.67 -- 3.75 4.21     Resistance Training   Training Prescription -- Yes -- Yes Yes   Weight -- 5 lb -- 5 lb 5 lb   Reps -- 10-15 -- 10-15 10-15     Interval Training   Interval Training -- -- -- No No     Treadmill   MPH -- -- -- 3.5 3.5   Grade -- -- -- 1 1   Minutes -- -- -- 15 15   METs -- -- -- 4.16 4.16     Recumbant Bike   Level -- -- -- 6 7   Watts -- -- -- 51 51   Minutes -- -- -- 15 15   METs -- -- -- 3.85 3.89     REL-XR   Level -- -- -- 8 8   Minutes -- -- -- 15 15   METs -- -- -- 6.2 5     Home Exercise Plan   Plans to continue exercise at -- -- [employer] (comment)  [employer] (comment)  [employer] (comment)  [employer]   Frequency -- -- Add 2 additional days to program exercise sessions. Add 2 additional days to program exercise sessions. Add 2 additional days to program exercise sessions.   Initial Home Exercises Provided -- -- 11/19/20 11/19/20 11/19/20    Row Name 12/20/20 1500 01/04/21 1300           Response to  Exercise   Blood Pressure (Admit) 106/64 112/62      Blood Pressure (Exit) 126/60 110/62      Heart Rate (Admit) 72 bpm 75 bpm      Heart Rate (Exercise) 142 bpm 132 bpm      Heart Rate (Exit) 116 bpm 133 bpm      Oxygen Saturation (Admit) 95 % 96 %      Oxygen Saturation (Exercise) 94 % 95 %      Oxygen Saturation (Exit) 95 % 95 %      Rating of Perceived Exertion (Exercise) 15 15      Symptoms none none  Duration Continue with 30 min of aerobic exercise without signs/symptoms of physical distress. Continue with 30 min of aerobic exercise without signs/symptoms of physical distress.      Intensity THRR unchanged THRR unchanged             Progression      Progression Continue to progress workloads to maintain intensity without signs/symptoms of physical distress. Continue to progress workloads to maintain intensity without signs/symptoms of physical distress.      Average METs 6.65 10.45             Resistance Training      Training Prescription Yes Yes      Weight 12 lb 12lb      Reps 10-15 10-15             Interval Training      Interval Training No No             Treadmill      MPH 3.8 3.6      Grade 12 14      Minutes 15 15      METs 10.8 10.7             Recumbant Bike      Level 7 --      Watts 47 --      Minutes 15 --      METs 3.89 --             NuStep      Level -- 1             REL-XR      Level 10 --      Minutes 15 --      METs 5.3 --             Home Exercise Plan      Plans to continue exercise at [employer] (comment)  [facility] (comment)  [employer]      Frequency Add 2 additional days to program exercise sessions. Add 2 additional days to program exercise sessions.      Initial Home Exercises Provided 11/19/20 11/19/20              Exercise Comments:   Exercise Comments     Row Name 11/03/20 0735 01/05/21 0815         Exercise Comments First full day of exercise!  Patient was oriented to gym and equipment  including functions, settings, policies, and procedures.  Patient's individual exercise prescription and treatment plan were reviewed.  All starting workloads were established based on the results of the 6 minute walk test done at initial orientation visit.  The plan for exercise progression was also introduced and progression will be customized based on patient's performance and goals. Morty graduated today from  rehab with 36 sessions completed.  Details of the patient's exercise prescription and what He needs to do in order to continue the prescription and progress were discussed with patient.  Patient was given a copy of prescription and goals.  Patient verbalized understanding.  Dreden plans to continue to exercise by going to the [facility].               Exercise Goals and Review:   Exercise Goals     Row Name 11/01/20 0926             Exercise Goals   Increase Physical Activity Yes       Intervention Provide advice, education, support and  counseling about physical activity/exercise needs.;Develop an individualized exercise prescription for aerobic and resistive training based on initial evaluation findings, risk stratification, comorbidities and participant's personal goals.       Expected Outcomes Long Term: Add in home exercise to make exercise part of routine and to increase amount of physical activity.;Short Term: Attend rehab on a regular basis to increase amount of physical activity.;Long Term: Exercising regularly at least 3-5 days a week.       Increase Strength and Stamina Yes       Intervention Provide advice, education, support and counseling about physical activity/exercise needs.;Develop an individualized exercise prescription for aerobic and resistive training based on initial evaluation findings, risk stratification, comorbidities and participant's personal goals.       Expected Outcomes Short Term: Increase workloads from initial exercise prescription for resistance, speed,  and METs.;Short Term: Perform resistance training exercises routinely during rehab and add in resistance training at home;Long Term: Improve cardiorespiratory fitness, muscular endurance and strength as measured by increased METs and functional capacity (6MWT)       Able to understand and use rate of perceived exertion (RPE) scale Yes       Intervention Provide education and explanation on how to use RPE scale       Expected Outcomes Long Term:  Able to use RPE to guide intensity level when exercising independently;Short Term: Able to use RPE daily in rehab to express subjective intensity level       Able to understand and use Dyspnea scale Yes       Intervention Provide education and explanation on how to use Dyspnea scale       Expected Outcomes Short Term: Able to use Dyspnea scale daily in rehab to express subjective sense of shortness of breath during exertion;Long Term: Able to use Dyspnea scale to guide intensity level when exercising independently       Knowledge and understanding of Target Heart Rate Range (THRR) Yes       Intervention Provide education and explanation of THRR including how the numbers were predicted and where they are located for reference       Expected Outcomes Short Term: Able to state/look up THRR;Short Term: Able to use daily as guideline for intensity in rehab;Long Term: Able to use THRR to govern intensity when exercising independently       Able to check pulse independently Yes       Intervention Provide education and demonstration on how to check pulse in carotid and radial arteries.;Review the importance of being able to check your own pulse for safety during independent exercise       Expected Outcomes Short Term: Able to explain why pulse checking is important during independent exercise;Long Term: Able to check pulse independently and accurately       Understanding of Exercise Prescription Yes       Intervention Provide education, explanation, and written  materials on patient's individual exercise prescription       Expected Outcomes Short Term: Able to explain program exercise prescription;Long Term: Able to explain home exercise prescription to exercise independently                Exercise Goals Re-Evaluation :  Exercise Goals Re-Evaluation     Row Name 11/03/20 0735 11/10/20 0756 11/19/20 0739 11/22/20 1439 12/06/20 1048     Exercise Goal Re-Evaluation   Exercise Goals Review Able to understand and use rate of perceived exertion (RPE) scale;Knowledge and understanding of Target Heart Rate  Range (THRR);Increase Physical Activity;Understanding of Exercise Prescription;Increase Strength and Stamina;Able to understand and use Dyspnea scale;Able to check pulse independently Increase Physical Activity;Increase Strength and Stamina Increase Physical Activity;Increase Strength and Stamina;Able to understand and use rate of perceived exertion (RPE) scale;Able to understand and use Dyspnea scale;Knowledge and understanding of Target Heart Rate Range (THRR);Able to check pulse independently;Understanding of Exercise Prescription Increase Physical Activity;Increase Strength and Stamina;Understanding of Exercise Prescription Increase Physical Activity;Increase Strength and Stamina   Comments Reviewed RPE and dyspnea scales, THR and program prescription with pt today.  Pt voiced understanding and was given a copy of goals to take home. Kaydenn is doing well so far with exercise.  He has increased to 5 lb for strength training.  Staff will monitor progress. Reviewed home exercise with pt today.  Pt plans to walk and use weights at home for exercise.  He also belongs to the [facility] and likes to use the elliptical there. Reviewed THR, pulse, RPE, sign and symptoms, pulse oximetery and when to call 911 or MD.  Also discussed weather considerations and indoor options.  Pt voiced understanding. Jaevon is doing well in rehab.  He is level 8 on the XR and 6 on the bike. We  will continue to monitor his progress. Hensley is continuing to do well. His level on the recumbant bike has increased to level 7. He continues to hit his THR. Will continue to monitor.   Expected Outcomes Short: Use RPE daily to regulate intensity. Long: Follow program prescription in THR. Short:  attend consistently Long:  improve overall stamina Short: Start to add exercise back in Long: Continue to improve stamina Short: Continue increase workloads Long: Continue to improve stamina. Short: Increase incline on treadmill Long: Continue to increase overall MET level    Row Name 12/20/20 0751 01/04/21 1341           Exercise Goal Re-Evaluation   Exercise Goals Review Increase Physical Activity;Increase Strength and Stamina Increase Physical Activity;Increase Strength and Stamina      Comments Stephone has been cleared to [employer] soccer.  He did a tournament this weekend and could tell his stamina is not quite back for that.  (it was very hot)  He is doing intervals on TM  to help build stamina. Ascher is doing intervals on the TM by changing incline.  He will graduate next session and improved post walk by 25%.      Expected Outcomes Short:  continue intervals Long: get back to former stamina Short: graduate Long: maintain fitness on his own               Discharge Exercise Prescription (Final Exercise Prescription Changes):  Exercise Prescription Changes - 01/04/21 1300       Response to Exercise   Blood Pressure (Admit) 112/62    Blood Pressure (Exit) 110/62    Heart Rate (Admit) 75 bpm    Heart Rate (Exercise) 132 bpm    Heart Rate (Exit) 133 bpm    Oxygen Saturation (Admit) 96 %    Oxygen Saturation (Exercise) 95 %    Oxygen Saturation (Exit) 95 %    Rating of Perceived Exertion (Exercise) 15    Symptoms none    Duration Continue with 30 min of aerobic exercise without signs/symptoms of physical distress.    Intensity THRR unchanged      Progression   Progression Continue to  progress workloads to maintain intensity without signs/symptoms of physical distress.    Average METs 10.45  Resistance Training   Training Prescription Yes    Weight 12lb    Reps 10-15      Interval Training   Interval Training No      Treadmill   MPH 3.6    Grade 14    Minutes 15    METs 10.7      NuStep   Level 1      Home Exercise Plan   Plans to continue exercise at [employer] (comment)   [employer]   Frequency Add 2 additional days to program exercise sessions.    Initial Home Exercises Provided 11/19/20             Nutrition:  Target Goals: Understanding of nutrition guidelines, daily intake of sodium 1500mg , cholesterol 200mg , calories 30% from fat and 7% or less from saturated fats, daily to have 5 or more servings of fruits and vegetables.  Education: All About Nutrition: -Group instruction provided by verbal, written material, interactive activities, discussions, models, and posters to present general guidelines for heart healthy nutrition including fat, fiber, MyPlate, the role of sodium in heart healthy nutrition, utilization of the nutrition label, and utilization of this knowledge for meal planning. Follow up email sent as well. Written material given at graduation. Flowsheet Row Cardiac Rehab from 12/29/2020 in [facility] Cardiac and Pulmonary Rehab  Education need identified 11/01/20  Date 12/08/20  Educator [facility]  Instruction Review Code 1- Verbalizes Understanding       Biometrics:  Pre Biometrics - 11/01/20 0926       Pre Biometrics   Height 5' 8.8" (1.748 m)    Weight 184 lb 4.8 oz (83.6 kg)    BMI (Calculated) 27.36    Single Leg Stand 30 seconds              Nutrition Therapy Plan and Nutrition Goals:  Nutrition Therapy & Goals - 11/15/20 1334       Nutrition Therapy   Diet Heart healthy, low Na    Protein (specify units) 65g    Fiber 30 grams    Whole Grain Foods 3 servings    Saturated Fats 12 max. grams    Fruits and  Vegetables 8 servings/day    Sodium 1.5 grams      Personal Nutrition Goals   Nutrition Goal ST: honor hunger - this may mean including more satisfying snacks LT: manage portion sizes of dinner meal    Comments FODMAPs - recommended by gastroenterologist, waiting to see what the colonoscopy results. Colonoscopy in Augusts. B: special K cereal (almond milk) S: carrots/celery L: sandwich ([place], ham, or chicken with pickles on whole wheat bread) with some chips like doritos S: carrots ans celery D: limiting fast food - he gets take out from restaurants. He will have vegetables and meatloaf, chicken and dumplings, 6 oz steak. He reports cooking some salmon with grilled mixed vegetables. Will also have an apple a day. Drinks: water, coffee - black , propel or gateraide mixed drinks 1x/day. Discussed heart healthy eating.      Intervention Plan   Intervention Prescribe, educate and counsel regarding individualized specific dietary modifications aiming towards targeted core components such as weight, hypertension, lipid management, diabetes, heart failure and other comorbidities.    Expected Outcomes Short Term Goal: Understand basic principles of dietary content, such as calories, fat, sodium, cholesterol and nutrients.;Short Term Goal: A plan has been developed with personal nutrition goals set during dietitian appointment.;Long Term Goal: Adherence to prescribed nutrition plan.  Nutrition Assessments:  MEDIFICTS Score Key: ?70 Need to make dietary changes  40-70 Heart Healthy Diet ? 40 Therapeutic Level Cholesterol Diet  Flowsheet Row Cardiac Rehab from [facility]/05/2021 in [facility] Cardiac and Pulmonary Rehab  Picture Your Plate Total Score on Admission 68  Picture Your Plate Total Score on Discharge 80      Picture Your Plate Scores: <55 Unhealthy dietary pattern with much room for improvement. 41-50 Dietary pattern unlikely to meet recommendations for good health and room for  improvement. 51-60 More healthful dietary pattern, with some room for improvement.  >60 Healthy dietary pattern, although there may be some specific behaviors that could be improved.    Nutrition Goals Re-Evaluation:  Nutrition Goals Re-Evaluation     Row Name 12/20/20 0744             Goals   Comment Rishon is eating more fruits and vegetables.  He does feel like he is staisfied with what he eats - not overly hungry between meals.  He does have some protein with meals and snacks.       Expected Outcome Short:  maintain eating fruits and vegetables Long: continue heart healthy diet                Nutrition Goals Discharge (Final Nutrition Goals Re-Evaluation):  Nutrition Goals Re-Evaluation - 12/20/20 0744       Goals   Comment Dewane is eating more fruits and vegetables.  He does feel like he is staisfied with what he eats - not overly hungry between meals.  He does have some protein with meals and snacks.    Expected Outcome Short:  maintain eating fruits and vegetables Long: continue heart healthy diet             Psychosocial: Target Goals: Acknowledge presence or absence of significant depression and/or stress, maximize coping skills, provide positive support system. Participant is able to verbalize types and ability to use techniques and skills needed for reducing stress and depression.   Education: Stress, Anxiety, and Depression - Group verbal and visual presentation to define topics covered.  Reviews how body is impacted by stress, anxiety, and depression.  Also discusses healthy ways to reduce stress and to treat/manage anxiety and depression.  Written material given at graduation.   Education: Sleep Hygiene -Provides group verbal and written instruction about how sleep can affect your health.  Define sleep hygiene, discuss sleep cycles and impact of sleep habits. Review good sleep hygiene tips.    Initial Review & Psychosocial Screening:  Initial Psych  Review & Screening - 10/26/20 0942       Initial Review   Current issues with History of Depression;Current Psychotropic Meds;Current Depression;Current Sleep Concerns;Current Stress Concerns    Source of Stress Concerns Chronic Illness;Unable to participate in former interests or hobbies;Unable to perform yard/household activities    Comments currently feeling managed on meds, dealing with back issues and SOB since all this has happened      [place]? Yes   wife and children     Barriers   Psychosocial barriers to participate in program The patient should benefit from training in stress management and relaxation.;Psychosocial barriers identified (see note)      Screening Interventions   Interventions To provide support and resources with identified psychosocial needs;Provide feedback about the scores to participant;Encouraged to exercise    Expected Outcomes Short Term goal: Utilizing psychosocial counselor, staff and physician to assist with identification of specific  Stressors or current issues interfering with healing process. Setting desired goal for each stressor or current issue identified.;Long Term Goal: Stressors or current issues are controlled or eliminated.;Short Term goal: Identification and review with participant of any Quality of Life or Depression concerns found by scoring the questionnaire.;Long Term goal: The participant improves quality of Life and PHQ9 Scores as seen by post scores and/or verbalization of changes             Quality of Life Scores:   Quality of Life - 01/05/21 0819       Quality of Life Scores   Health/Function Pre 18.57 %    Health/Function Post 26.5 %    Health/Function % Change 42.7 %    Socioeconomic Pre 22.06 %    Socioeconomic Post 25.31 %    Socioeconomic % Change  14.73 %    Psych/Spiritual Pre 24.93 %    Psych/Spiritual Post 26.14 %    Psych/Spiritual % Change 4.85 %    Family Pre 28.8 %    Family Post  28.8 %    Family % Change 0 %    GLOBAL Pre 22.1 %    GLOBAL Post 26.49 %    GLOBAL % Change 19.86 %            Scores of 19 and below usually indicate a poorer quality of life in these areas.  A difference of  2-3 points is a clinically meaningful difference.  A difference of 2-3 points in the total score of the Quality of Life Index has been associated with significant improvement in overall quality of life, self-image, physical symptoms, and general health in studies assessing change in quality of life.  PHQ-9: Recent Review Flowsheet Data     Depression screen [facility] 2/9 01/05/2021 11/01/2020   Decreased Interest 0 0   Down, Depressed, Hopeless 1 0   PHQ - 2 Score 1 0   Altered sleeping 1 1   Tired, decreased energy 1 1   Change in appetite 0 0   Feeling bad or failure about yourself  0 0   Trouble concentrating 1 1   Moving slowly or fidgety/restless 0 0   Suicidal thoughts 0 0   PHQ-9 Score 4 3   Difficult doing work/chores Not difficult at all Somewhat difficult      Interpretation of Total Score  Total Score Depression Severity:  1-4 = Minimal depression, 5-9 = Mild depression, 10-14 = Moderate depression, 15-19 = Moderately severe depression, 20-27 = Severe depression   Psychosocial Evaluation and Intervention:  Psychosocial Evaluation - 10/26/20 0950       Psychosocial Evaluation & Interventions   Interventions Encouraged to exercise with the program and follow exercise prescription;Stress management education    Comments Jabez is coming into rehab after his 3rd stent.  This will be his first time through  as he was previously very active and refereeing soccer.  This time, however, he was not able to bounce back as readily and not able to do soccer all season.  He would like to be ready for the fall season.   He has also been struggling with shortness of breath and would like to get that under better control.  He is already back to work in [employer] on the computer.  He has a  great support system with his family. He does have a history of depression but is currently well managed on klonopin.  He also uses a BiPap at night routinuely to  sleep.  Overall, he is doing well.    Expected Outcomes Short: Attend rehab to build stamina and improve SOB Long: Continue to stay positive    Continue Psychosocial Services  Follow up required by staff             Psychosocial Re-Evaluation:  Psychosocial Re-Evaluation     [place] Name 11/19/20 0756 12/20/20 0747           Psychosocial Re-Evaluation   Current issues with Current Stress Concerns Current Stress Concerns      Comments Gustaf is doing well mentally.  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  He is eager to get back and hoping by fall he can go again. He sleeps pretty good overall. Otherwise, work is stressful but he copes as best he is able. Johnston is back to [employer] soccer.  He felt his endurance is not quite back, however is was very hot.    Sleep is good overall.  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  He felt his endurance is not quite back, however is was very hot.    Sleep is good overall.  No new stress concerns    Expected Outcomes Short:  continue to exercise to get endurance back Long:  maintain positive outlook             Vocational Rehabilitation: Provide vocational rehab assistance to qualifying  candidates.   Vocational Rehab Evaluation & Intervention:  Vocational Rehab - 10/26/20 0941       Initial Vocational Rehab Evaluation & Intervention   Assessment shows need for Vocational Rehabilitation No             Education: Education Goals: Education classes will be provided on a variety of topics geared toward better understanding of heart health and risk factor modification. Participant will state understanding/return demonstration of topics presented as noted by education test scores.  Learning Barriers/Preferences:  Learning Barriers/Preferences - 10/26/20 0940       Learning Barriers/Preferences   Learning Barriers Sight   glasses   Learning Preferences None             General Cardiac Education Topics:  AED/CPR: - Group verbal and written instruction with the use of models to demonstrate the basic use of the AED with the basic ABC's of resuscitation.   Anatomy and Cardiac Procedures: - Group verbal and visual presentation and models provide information about basic cardiac anatomy and function. Reviews the testing methods done to diagnose heart disease and the outcomes of the test results. Describes the treatment choices: Medical Management, Angioplasty, or Coronary Bypass Surgery for treating various heart conditions including Myocardial Infarction, Angina, Valve Disease, and Cardiac Arrhythmias.  Written material given at graduation. Flowsheet Row Cardiac Rehab from 12/29/2020 in [facility] Cardiac and Pulmonary Rehab  Education need identified  11/01/20  Date 11/24/20  Educator SB  Instruction Review Code 1- Verbalizes Understanding       Medication Safety: - Group verbal and visual instruction to review commonly prescribed medications for heart and lung disease. Reviews the medication, class of the drug, and side effects. Includes the steps to properly store meds and maintain the prescription regimen.  Written material given at graduation. Flowsheet Row Cardiac  Rehab from 12/29/2020 in [facility] Cardiac and Pulmonary Rehab  Date 12/15/20  Educator SB  Instruction Review Code 1- Verbalizes Understanding       Intimacy: - Group verbal instruction through game format to discuss how heart and lung disease can affect sexual intimacy. Written material given at graduation.. Flowsheet Row Cardiac Rehab from 12/29/2020 in [facility] Cardiac and Pulmonary Rehab  Date 11/17/20  Educator [facility]  Instruction Review Code 1- Verbalizes Understanding       Know Your Numbers and Heart Failure: - Group verbal and visual instruction to discuss disease risk factors for cardiac and pulmonary disease and treatment options.  Reviews associated critical values for Overweight/Obesity, Hypertension, Cholesterol, and Diabetes.  Discusses basics of heart failure: signs/symptoms and treatments.  Introduces Heart Failure Zone chart for action plan for heart failure.  Written material given at graduation. Flowsheet Row Cardiac Rehab from 12/29/2020 in [facility] Cardiac and Pulmonary Rehab  Date 12/22/20  Educator SB  Instruction Review Code 1- Verbalizes Understanding       Infection Prevention: - Provides verbal and written material to individual with discussion of infection control including proper hand washing and proper equipment cleaning during exercise session. Flowsheet Row Cardiac Rehab from 12/29/2020 in [facility] Cardiac and Pulmonary Rehab  Date 11/01/20  Educator [facility]  Instruction Review Code 1- Verbalizes Understanding       Falls Prevention: - Provides verbal and written material to individual with discussion of falls prevention and safety. Flowsheet Row Cardiac Rehab from 12/29/2020 in [facility] Cardiac and Pulmonary Rehab  Date 11/01/20  Educator [facility]  Instruction Review Code 1- Verbalizes Understanding       Other: -Provides group and verbal instruction on various topics (see comments)   Knowledge Questionnaire Score:  Knowledge Questionnaire Score - 01/05/21 0817        Knowledge Questionnaire Score   Pre Score 21/26 Education Focus: MI, chest pain, nurtition, exercise    Post Score 25/26             Core Components/Risk Factors/Patient Goals at Admission:  Personal Goals and Risk Factors at Admission - 11/01/20 0929       Core Components/Risk Factors/Patient Goals on Admission    Weight Management Yes;Weight Loss    Intervention Weight Management: Develop a combined nutrition and exercise program designed to reach desired caloric intake, while maintaining appropriate intake of nutrient and fiber, sodium and fats, and appropriate energy expenditure required for the weight goal.;Weight Management: Provide education and appropriate resources to help participant work on and attain dietary goals.    Admit Weight 184 lb 4.8 oz (83.6 kg)    Goal Weight: Short Term 180 lb (81.6 kg)    Goal Weight: Long Term 175 lb (79.4 kg)    Expected Outcomes Short Term: Continue to assess and modify interventions until short term weight is achieved;Long Term: Adherence to nutrition and physical activity/exercise program aimed toward attainment of established weight goal;Weight Loss: Understanding of general recommendations for a balanced deficit meal plan, which promotes 1-2 lb weight loss per week and includes a negative energy balance of [phone] kcal/d;Understanding  of distribution of calorie intake throughout the day with the consumption of 4-5 meals/snacks;Understanding recommendations for meals to include 15-35% energy as protein, 25-35% energy from fat, 35-60% energy from carbohydrates, less than $RemoveB'200mg'EtmiunOP$  of dietary cholesterol, 20-35 gm of total fiber daily    Diabetes Yes    Intervention Provide education about signs/symptoms and action to take for hypo/hyperglycemia.;Provide education about proper nutrition, including hydration, and aerobic/resistive exercise prescription along with prescribed medications to achieve blood glucose in normal ranges: Fasting glucose 65-99  mg/dL    Expected Outcomes Short Term: Participant verbalizes understanding of the signs/symptoms and immediate care of hyper/hypoglycemia, proper foot care and importance of medication, aerobic/resistive exercise and nutrition plan for blood glucose control.;Long Term: Attainment of HbA1C < 7%.    Hypertension Yes    Intervention Provide education on lifestyle modifcations including regular physical activity/exercise, weight management, moderate sodium restriction and increased consumption of fresh fruit, vegetables, and low fat dairy, alcohol moderation, and smoking cessation.;Monitor prescription use compliance.    Expected Outcomes Short Term: Continued assessment and intervention until BP is < 140/76mm HG in hypertensive participants. < 130/34mm HG in hypertensive participants with diabetes, heart failure or chronic kidney disease.;Long Term: Maintenance of blood pressure at goal levels.    Lipids Yes    Intervention Provide education and support for participant on nutrition & aerobic/resistive exercise along with prescribed medications to achieve LDL '70mg'$ , HDL >$Remo'40mg'HZzGb$ .    Expected Outcomes Short Term: Participant states understanding of desired cholesterol values and is compliant with medications prescribed. Participant is following exercise prescription and nutrition guidelines.;Long Term: Cholesterol controlled with medications as prescribed, with individualized exercise RX and with personalized nutrition plan. Value goals: LDL < $Rem'70mg'cIux$ , HDL > 40 mg.             Education:Diabetes - Individual verbal and written instruction to review signs/symptoms of diabetes, desired ranges of glucose level fasting, after meals and with exercise. Acknowledge that pre and post exercise glucose checks will be done for 3 sessions at entry of program. Flowsheet Row Cardiac Rehab from 12/29/2020 in [facility] Cardiac and Pulmonary Rehab  Date 11/01/20  Educator [facility]  Instruction Review Code 1- Verbalizes Understanding        Core Components/Risk Factors/Patient Goals Review:   Goals and Risk Factor Review     Row Name 11/19/20 0754 12/20/20 0739           Core Components/Risk Factors/Patient Goals Review   Personal Goals Review Weight Management/Obesity;Diabetes;Hypertension Weight Management/Obesity;Diabetes;Hypertension      Review Arianna is off to a good start in rehab. So far his weight has been staying the same.  He is doing well with his blood pressures and he does check them at home.  His sugars have been going up and down.  He checks them daily in the morning and running between 125-180 mg/dl.  He does try to get in protein at night. Kevins weight has been steady.  He checks BP and BG at home - FBG around 120, which is more steady than before.  Resting BP todat 112/64.  We reviewed noticing how clothes fit,etc, not just weight on scale, and the importance of exercise in managing risk factors.      Expected Outcomes Short: Continue to work on diabetes management with diet.  Long: continue to monitor risk factors. Short:  continue to exercise to help manage BG Long: manage risk factors long term               Core  Components/Risk Factors/Patient Goals at Discharge (Final Review):   Goals and Risk Factor Review - 12/20/20 0739       Core Components/Risk Factors/Patient Goals Review   Personal Goals Review Weight Management/Obesity;Diabetes;Hypertension    Review Kevins weight has been steady.  He checks BP and BG at home - FBG around 120, which is more steady than before.  Resting BP todat 112/64.  We reviewed noticing how clothes fit,etc, not just weight on scale, and the importance of exercise in managing risk factors.    Expected Outcomes Short:  continue to exercise to help manage BG Long: manage risk factors long term             ITP Comments:  ITP Comments     Row Name 10/26/20 0958 11/01/20 0919 11/03/20 0735 11/10/20 0854 11/15/20 1425   ITP Comments Completed virtual  orientation today.  EP evaluation is scheduled for Monday 6/6 at 8am.  Documentation for diagnosis can be found in [facility] encounter 10/06/20. Completed 6MWT and gym orientation. Initial ITP created and sent for review to Dr. Emily Filbert, Medical Director. First full day of exercise!  Patient was oriented to gym and equipment including functions, settings, policies, and procedures.  Patient's individual exercise prescription and treatment plan were reviewed.  All starting workloads were established based on the results of the 6 minute walk test done at initial orientation visit.  The plan for exercise progression was also introduced and progression will be customized based on patient's performance and goals. 30 Day review completed. Medical Director ITP review done, changes made as directed, and signed approval by Medical Director.  new to program Completed initial RD consultation    Row Name 12/08/20 1127 01/05/21 0800 01/05/21 0815       ITP Comments 30 Day review completed. Medical Director ITP review done, changes made as directed, and signed approval by Medical Director. 30 Day review completed. Medical Director ITP review done, changes made as directed, and signed approval by Medical Director. Treysean graduated today from  rehab with 36 sessions completed.  Details of the patient's exercise prescription and what He needs to do in order to continue the prescription and progress were discussed with patient.  Patient was given a copy of prescription and goals.  Patient verbalized understanding.  Ahmani plans to continue to exercise by going to the [facility].              Comments: discharge ITP

## 2021-01-05 NOTE — Progress Notes (Signed)
Daily Session Note  Patient Details  Name: Frank Wong MRN: [id number] Date of Birth: 16-Dec-1955 Referring Provider:   Flowsheet Row Cardiac Rehab from 11/01/2020 in [facility] Cardiac and Pulmonary Rehab  Referring Provider Frank Cowman MD       Encounter Date: 01/05/2021  Check In:  Session Check In - 01/05/21 0813       Check-In   Supervising physician immediately available to respond to emergencies See telemetry face sheet for immediately available ER MD    Location [facility]-Cardiac & Pulmonary Rehab    Staff Present Frank Wong, MPA, Frank Glow, MS, ASCM CEP, Exercise Physiologist;Frank Wong, BA, ACSM CEP, Exercise Physiologist    Virtual Visit No    Medication changes reported     No    Fall or balance concerns reported    No    Warm-up and Cool-down Performed on first and last piece of equipment    Resistance Training Performed Yes    VAD Patient? No    PAD/SET Patient? No      Pain Assessment   Currently in Pain? No/denies                Social History   Tobacco Use  Smoking Status Former  Smokeless Tobacco Former    Goals Met:  Independence with exercise equipment Exercise tolerated well No report of cardiac concerns or symptoms Strength training completed today  Goals Unmet:  Not Applicable  Comments:  Frank Wong graduated today from  rehab with 36 sessions completed.  Details of the patient's exercise prescription and what He needs to do in order to continue the prescription and progress were discussed with patient.  Patient was given a copy of prescription and goals.  Patient verbalized understanding.  Frank Wong plans to continue to exercise by going to the [facility].    Dr. Emily Wong is Medical Director for [place].  Dr. Ottie Wong is Medical Director for [facility] Pulmonary Rehabilitation.

## 2021-01-05 NOTE — Progress Notes (Signed)
Cardiac Individual Treatment Plan  Patient Details  Name: Frank Wong MRN: [id number] Date of Birth: [birth year] Referring Provider:   Flowsheet Row Cardiac Rehab from 11/01/2020 in [facility] Cardiac and Pulmonary Rehab  Referring Provider Frank Cowman MD       Initial Encounter Date:  Flowsheet Row Cardiac Rehab from 11/01/2020 in [facility] Cardiac and Pulmonary Rehab  Date 11/01/20       Visit Diagnosis: Status post coronary artery stent placement  Patient's Home Medications on Admission:  Current Outpatient Medications:    aspirin EC 81 MG tablet, Take 81 mg by mouth in the morning., Disp: , Rfl:    atorvastatin (LIPITOR) 80 MG tablet, Take 80 mg by mouth in the morning., Disp: , Rfl:    B Complex-C (B-COMPLEX WITH VITAMIN C) tablet, Take 1 tablet by mouth in the morning., Disp: , Rfl:    benazepril (LOTENSIN) 20 MG tablet, Take 20 mg by mouth in the morning., Disp: , Rfl:    CINNAMON PO, Take 1,000 mg by mouth in the morning and at bedtime., Disp: , Rfl:    clonazePAM (KLONOPIN) 2 MG tablet, Take 1 mg by mouth at bedtime as needed (sleep/ anxiety)., Disp: , Rfl:    clopidogrel (PLAVIX) 75 MG tablet, Take 75 mg by mouth in the morning., Disp: , Rfl:    dicyclomine (BENTYL) 10 MG capsule, Take 10 mg by mouth 4 (four) times daily as needed for spasms., Disp: , Rfl:    EPINEPHrine 0.3 mg/0.3 mL IJ SOAJ injection, Inject 0.3 mg into the muscle as needed for anaphylaxis., Disp: , Rfl:    ferrous sulfate 325 (65 FE) MG tablet, Take 325 mg by mouth in the morning., Disp: , Rfl:    fexofenadine (ALLEGRA) 180 MG tablet, Take 180 mg by mouth in the morning., Disp: , Rfl:    gabapentin (NEURONTIN) 600 MG tablet, Take 300 mg by mouth at bedtime., Disp: , Rfl:    GAVILYTE-G 236 g solution, Take 4,000 mLs by mouth as directed., Disp: , Rfl:    glipiZIDE (GLUCOTROL) 10 MG tablet, Take 10 mg by mouth every morning., Disp: , Rfl:    hyoscyamine (LEVSIN SL) 0.125 MG SL tablet, Place 0.125 mg under  the tongue every 6 (six) hours as needed (abdominal spasms)., Disp: , Rfl:    JARDIANCE 25 MG TABS tablet, Take 25 mg by mouth in the morning., Disp: , Rfl:    levocetirizine (XYZAL) 5 MG tablet, Take 5 mg by mouth every evening., Disp: , Rfl:    magnesium oxide (MAG-OX) 400 MG tablet, Take 400 mg by mouth in the morning., Disp: , Rfl:    melatonin 5 MG TABS, Take 5 mg by mouth at bedtime as needed (sleep)., Disp: , Rfl:    metFORMIN (GLUCOPHAGE) 1000 MG tablet, Take 1,000 mg by mouth 2 (two) times daily with a meal. , Disp: , Rfl:    montelukast (SINGULAIR) 10 MG tablet, Take 10 mg by mouth at bedtime., Disp: , Rfl:    Multiple Vitamin (MULTIVITAMIN WITH MINERALS) TABS tablet, Take 1 tablet by mouth in the morning., Disp: , Rfl:    pantoprazole (PROTONIX) 40 MG tablet, Take 40 mg by mouth at bedtime., Disp: , Rfl:    Peppermint Oil (IBGARD PO), Take 2 tablets by mouth in the morning and at bedtime., Disp: , Rfl:    psyllium (METAMUCIL SMOOTH TEXTURE) 28 % packet, Take 1 packet by mouth in the morning., Disp: , Rfl:    sildenafil (REVATIO)  20 MG tablet, Take 10 mg by mouth in the morning and at bedtime., Disp: , Rfl:    Testosterone 20.25 MG/ACT (1.62%) GEL, Apply 2 Pump topically in the morning. Applied to shoulders., Disp: , Rfl:    triamcinolone cream (KENALOG) 0.1 %, Apply 1 application topically 2 (two) times daily as needed (skin irritation/itching)., Disp: , Rfl:    umeclidinium-vilanterol (ANORO ELLIPTA) 62.5-25 MCG/INH AEPB, Inhale 1 puff into the lungs in the morning., Disp: , Rfl:   Past Medical History: Past Medical History:  Diagnosis Date   Asthma    CAD (coronary artery disease)    Cardiac arrest ([place])    Diabetes mellitus without complication ([place])    GERD (gastroesophageal reflux disease)    Heart attack ([place])    Hypertension    Shoulder problem 2010   right   Sleep apnea     Tobacco Use: Social History   Tobacco Use  Smoking Status Former  Smokeless Tobacco  Former    Labs: Recent Review Flowsheet Data   There is no flowsheet data to display.      Exercise Target Goals: Exercise Program Goal: Individual exercise prescription set using results from initial 6 min walk test and THRR while considering  patient's activity barriers and safety.   Exercise Prescription Goal: Initial exercise prescription builds to 30-45 minutes a day of aerobic activity, 2-3 days per week.  Home exercise guidelines will be given to patient during program as part of exercise prescription that the participant will acknowledge.   Education: Aerobic Exercise: - Group verbal and visual presentation on the components of exercise prescription. Introduces F.I.T.T principle from ACSM for exercise prescriptions.  Reviews F.I.T.T. principles of aerobic exercise including progression. Written material given at graduation. Flowsheet Row Cardiac Rehab from 12/29/2020 in [facility] Cardiac and Pulmonary Rehab  Education need identified 11/01/20  Date 11/17/20  Educator [facility]  Instruction Review Code 1- Verbalizes Understanding       Education: Resistance Exercise: - Group verbal and visual presentation on the components of exercise prescription. Introduces F.I.T.T principle from ACSM for exercise prescriptions  Reviews F.I.T.T. principles of resistance exercise including progression. Written material given at graduation. Flowsheet Row Cardiac Rehab from 12/29/2020 in [facility] Cardiac and Pulmonary Rehab  Date 11/24/20  Educator [facility]  Instruction Review Code 1- Verbalizes Understanding        Education: Exercise & Equipment Safety: - Individual verbal instruction and demonstration of equipment use and safety with use of the equipment. Flowsheet Row Cardiac Rehab from 12/29/2020 in [facility] Cardiac and Pulmonary Rehab  Date 11/01/20  Educator [facility]  Instruction Review Code 1- Verbalizes Understanding       Education: Exercise Physiology & General Exercise Guidelines: - Group verbal and  written instruction with models to review the exercise physiology of the cardiovascular system and associated critical values. Provides general exercise guidelines with specific guidelines to those with heart or lung disease.  Flowsheet Row Cardiac Rehab from 12/29/2020 in [facility] Cardiac and Pulmonary Rehab  Date 11/10/20  Educator [facility]  Instruction Review Code 1- Verbalizes Understanding       Education: Flexibility, Balance, Mind/Body Relaxation: - Group verbal and visual presentation with interactive activity on the components of exercise prescription. Introduces F.I.T.T principle from ACSM for exercise prescriptions. Reviews F.I.T.T. principles of flexibility and balance exercise training including progression. Also discusses the mind body connection.  Reviews various relaxation techniques to help reduce and manage stress (i.e. Deep breathing, progressive muscle relaxation, and visualization). Balance handout provided to take  home. Written material given at graduation. Flowsheet Row Cardiac Rehab from 12/29/2020 in [facility] Cardiac and Pulmonary Rehab  Date 12/01/20  Educator [facility]  Instruction Review Code 1- Verbalizes Understanding       Activity Barriers & Risk Stratification:  Activity Barriers & Cardiac Risk Stratification - 11/01/20 0920       Activity Barriers & Cardiac Risk Stratification   Activity Barriers Muscular Weakness;Deconditioning;Joint Problems;Other (comment);Back Problems    Comments hx of sports injuries, played football and refereed soccer    Cardiac Risk Stratification Moderate             6 Minute Walk:  6 Minute Walk     Row Name 11/01/20 0920 12/29/20 0904       6 Minute Walk   Phase Initial Discharge    Distance 1455 feet 1820 feet    Distance % Change -- 25 %    Walk Time 6 minutes 6 minutes    # of Rest Breaks 0 0    MPH 2.76 3.44    METS 3.78 4.67    RPE 9 10    Perceived Dyspnea  1 0    VO2 Peak 13.22 16.35    Symptoms No No    Resting HR 74  bpm 67 bpm    Resting BP 134/72 104/54    Resting Oxygen Saturation  98 % 96 %    Exercise Oxygen Saturation  during 6 min walk 98 % 93 %    Max Ex. HR 110 bpm 117 bpm    Max Ex. BP 136/70 160/68    2 Minute Post BP 114/66 --             Oxygen Initial Assessment:   Oxygen Re-Evaluation:   Oxygen Discharge (Final Oxygen Re-Evaluation):   Initial Exercise Prescription:  Initial Exercise Prescription - 11/01/20 0900       Date of Initial Exercise RX and Referring Provider   Date 11/01/20    Referring Provider Paraschos, Alexander MD      Treadmill   MPH 2.7    Grade 1    Minutes 15    METs 3.44      Recumbant Bike   Level 3    RPM 50    Watts 47    Minutes 15    METs 3.5      REL-XR   Level 3    Speed 50    Minutes 15    METs 3.5      Track   Laps 40    Minutes 15    METs 3.2      Prescription Details   Frequency (times per week) 3    Duration Progress to 30 minutes of continuous aerobic without signs/symptoms of physical distress      Intensity   THRR 40-80% of Max Heartrate 108-141    Ratings of Perceived Exertion 11-13    Perceived Dyspnea 0-4      Progression   Progression Continue to progress workloads to maintain intensity without signs/symptoms of physical distress.      Resistance Training   Training Prescription Yes    Wong 4 lb    Reps 10-15             Perform Capillary Blood Glucose checks as needed.  Exercise Prescription Changes:   Exercise Prescription Changes     Row Name 11/01/20 0900 11/10/20 0700 11/19/20 0700 11/22/20 1400 12/06/20 1000     Response to Exercise  Blood Pressure (Admit) 134/72 114/70 -- 122/62 132/64   Blood Pressure (Exercise) 136/70 132/82 -- 106/64 --   Blood Pressure (Exit) 114/66 98/58 -- 122/80 122/62   Heart Rate (Admit) 74 bpm 75 bpm -- 78 bpm 61 bpm   Heart Rate (Exercise) 110 bpm 128 bpm -- 142 bpm 138 bpm   Heart Rate (Exit) 87 bpm 104 bpm -- 128 bpm 119 bpm   Oxygen Saturation  (Admit) 98 % -- -- -- --   Oxygen Saturation (Exercise) 98 % -- -- -- --   Rating of Perceived Exertion (Exercise) 9 12 -- 14 14   Perceived Dyspnea (Exercise) 1 -- -- -- --   Symptoms SOB -- -- none none   Comments walk test results -- -- -- --   Duration -- Progress to 30 minutes of  aerobic without signs/symptoms of physical distress -- Continue with 30 min of aerobic exercise without signs/symptoms of physical distress. Continue with 30 min of aerobic exercise without signs/symptoms of physical distress.   Intensity -- THRR unchanged -- THRR unchanged THRR unchanged     Progression   Progression -- Continue to progress workloads to maintain intensity without signs/symptoms of physical distress. -- Continue to progress workloads to maintain intensity without signs/symptoms of physical distress. Continue to progress workloads to maintain intensity without signs/symptoms of physical distress.   Average METs -- 4.67 -- 3.75 4.21     Resistance Training   Training Prescription -- Yes -- Yes Yes   Wong -- 5 lb -- 5 lb 5 lb   Reps -- 10-15 -- 10-15 10-15     Interval Training   Interval Training -- -- -- No No     Treadmill   MPH -- -- -- 3.5 3.5   Grade -- -- -- 1 1   Minutes -- -- -- 15 15   METs -- -- -- 4.16 4.16     Recumbant Bike   Level -- -- -- 6 7   Watts -- -- -- 51 51   Minutes -- -- -- 15 15   METs -- -- -- 3.85 3.89     REL-XR   Level -- -- -- 8 8   Minutes -- -- -- 15 15   METs -- -- -- 6.2 5     Home Exercise Plan   Plans to continue exercise at -- -- [employer] (comment)  [employer] (comment)  [employer] (comment)  [employer]   Frequency -- -- Add 2 additional days to program exercise sessions. Add 2 additional days to program exercise sessions. Add 2 additional days to program exercise sessions.   Initial Home Exercises Provided -- -- 11/19/20 11/19/20 11/19/20    Row Name 12/20/20 1500 01/04/21 1300           Response to  Exercise   Blood Pressure (Admit) 106/64 112/62      Blood Pressure (Exit) 126/60 110/62      Heart Rate (Admit) 72 bpm 75 bpm      Heart Rate (Exercise) 142 bpm 132 bpm      Heart Rate (Exit) 116 bpm 133 bpm      Oxygen Saturation (Admit) 95 % 96 %      Oxygen Saturation (Exercise) 94 % 95 %      Oxygen Saturation (Exit) 95 % 95 %      Rating of Perceived Exertion (Exercise) 15 15      Symptoms none none  Duration Continue with 30 min of aerobic exercise without signs/symptoms of physical distress. Continue with 30 min of aerobic exercise without signs/symptoms of physical distress.      Intensity THRR unchanged THRR unchanged             Progression      Progression Continue to progress workloads to maintain intensity without signs/symptoms of physical distress. Continue to progress workloads to maintain intensity without signs/symptoms of physical distress.      Average METs 6.65 10.45             Resistance Training      Training Prescription Yes Yes      Wong 12 lb 12lb      Reps 10-15 10-15             Interval Training      Interval Training No No             Treadmill      MPH 3.8 3.6      Grade 12 14      Minutes 15 15      METs 10.8 10.7             Recumbant Bike      Level 7 --      Watts 47 --      Minutes 15 --      METs 3.89 --             NuStep      Level -- 1             REL-XR      Level 10 --      Minutes 15 --      METs 5.3 --             Home Exercise Plan      Plans to continue exercise at [employer] (comment)  [place] (comment)  [employer]      Frequency Add 2 additional days to program exercise sessions. Add 2 additional days to program exercise sessions.      Initial Home Exercises Provided 11/19/20 11/19/20              Exercise Comments:   Exercise Comments     Row Name 11/03/20 0735           Exercise Comments First full day of exercise!  Patient was oriented to gym and equipment including  functions, settings, policies, and procedures.  Patient's individual exercise prescription and treatment plan were reviewed.  All starting workloads were established based on the results of the 6 minute walk test done at initial orientation visit.  The plan for exercise progression was also introduced and progression will be customized based on patient's performance and goals.                Exercise Goals and Review:   Exercise Goals     Row Name 11/01/20 0926             Exercise Goals   Increase Physical Activity Yes       Intervention Provide advice, education, support and counseling about physical activity/exercise needs.;Develop an individualized exercise prescription for aerobic and resistive training based on initial evaluation findings, risk stratification, comorbidities and participant's personal goals.       Expected Outcomes Long Term: Add in home exercise to make exercise part of routine and to increase amount of physical activity.;Short Term: Attend rehab on a regular basis  to increase amount of physical activity.;Long Term: Exercising regularly at least 3-5 days a week.       Increase Strength and Stamina Yes       Intervention Provide advice, education, support and counseling about physical activity/exercise needs.;Develop an individualized exercise prescription for aerobic and resistive training based on initial evaluation findings, risk stratification, comorbidities and participant's personal goals.       Expected Outcomes Short Term: Increase workloads from initial exercise prescription for resistance, speed, and METs.;Short Term: Perform resistance training exercises routinely during rehab and add in resistance training at home;Long Term: Improve cardiorespiratory fitness, muscular endurance and strength as measured by increased METs and functional capacity (6MWT)       Able to understand and use rate of perceived exertion (RPE) scale Yes       Intervention Provide  education and explanation on how to use RPE scale       Expected Outcomes Long Term:  Able to use RPE to guide intensity level when exercising independently;Short Term: Able to use RPE daily in rehab to express subjective intensity level       Able to understand and use Dyspnea scale Yes       Intervention Provide education and explanation on how to use Dyspnea scale       Expected Outcomes Short Term: Able to use Dyspnea scale daily in rehab to express subjective sense of shortness of breath during exertion;Long Term: Able to use Dyspnea scale to guide intensity level when exercising independently       Knowledge and understanding of Target Heart Rate Range (THRR) Yes       Intervention Provide education and explanation of THRR including how the numbers were predicted and where they are located for reference       Expected Outcomes Short Term: Able to state/look up THRR;Short Term: Able to use daily as guideline for intensity in rehab;Long Term: Able to use THRR to govern intensity when exercising independently       Able to check pulse independently Yes       Intervention Provide education and demonstration on how to check pulse in carotid and radial arteries.;Review the importance of being able to check your own pulse for safety during independent exercise       Expected Outcomes Short Term: Able to explain why pulse checking is important during independent exercise;Long Term: Able to check pulse independently and accurately       Understanding of Exercise Prescription Yes       Intervention Provide education, explanation, and written materials on patient's individual exercise prescription       Expected Outcomes Short Term: Able to explain program exercise prescription;Long Term: Able to explain home exercise prescription to exercise independently                Exercise Goals Re-Evaluation :  Exercise Goals Re-Evaluation     Row Name 11/03/20 0735 11/10/20 0756 11/19/20 0739 11/22/20  1439 12/06/20 1048     Exercise Goal Re-Evaluation   Exercise Goals Review Able to understand and use rate of perceived exertion (RPE) scale;Knowledge and understanding of Target Heart Rate Range (THRR);Increase Physical Activity;Understanding of Exercise Prescription;Increase Strength and Stamina;Able to understand and use Dyspnea scale;Able to check pulse independently Increase Physical Activity;Increase Strength and Stamina Increase Physical Activity;Increase Strength and Stamina;Able to understand and use rate of perceived exertion (RPE) scale;Able to understand and use Dyspnea scale;Knowledge and understanding of Target Heart Rate Range (THRR);Able to check pulse independently;Understanding  of Exercise Prescription Increase Physical Activity;Increase Strength and Stamina;Understanding of Exercise Prescription Increase Physical Activity;Increase Strength and Stamina   Comments Reviewed RPE and dyspnea scales, THR and program prescription with pt today.  Pt voiced understanding and was given a copy of goals to take home. Frank Wong is doing well so far with exercise.  He has increased to 5 lb for strength training.  Staff will monitor progress. Reviewed home exercise with pt today.  Pt plans to walk and use weights at home for exercise.  He also belongs to the [facility] and likes to use the elliptical there. Reviewed THR, pulse, RPE, sign and symptoms, pulse oximetery and when to call 911 or MD.  Also discussed weather considerations and indoor options.  Pt voiced understanding. Frank Wong is doing well in rehab.  He is level 8 on the XR and 6 on the bike. We will continue to monitor his progress. Frank Wong is continuing to do well. His level on the recumbant bike has increased to level 7. He continues to hit his THR. Will continue to monitor.   Expected Outcomes Short: Use RPE daily to regulate intensity. Long: Follow program prescription in THR. Short:  attend consistently Long:  improve overall stamina Short: Start to add  exercise back in Long: Continue to improve stamina Short: Continue increase workloads Long: Continue to improve stamina. Short: Increase incline on treadmill Long: Continue to increase overall MET level    Row Name 12/20/20 0751 01/04/21 1341           Exercise Goal Re-Evaluation   Exercise Goals Review Increase Physical Activity;Increase Strength and Stamina Increase Physical Activity;Increase Strength and Stamina      Comments Frank Wong has been cleared to [employer].  He did a tournament this weekend and could tell his stamina is not quite back for that.  (it was very hot)  He is doing intervals on TM  to help build stamina. Frank Wong is doing intervals on the TM by changing incline.  He will graduate next session and improved post walk by 25%.      Expected Outcomes Short:  continue intervals Long: get back to former stamina Short: graduate Long: maintain fitness on his own               Discharge Exercise Prescription (Final Exercise Prescription Changes):  Exercise Prescription Changes - 01/04/21 1300       Response to Exercise   Blood Pressure (Admit) 112/62    Blood Pressure (Exit) 110/62    Heart Rate (Admit) 75 bpm    Heart Rate (Exercise) 132 bpm    Heart Rate (Exit) 133 bpm    Oxygen Saturation (Admit) 96 %    Oxygen Saturation (Exercise) 95 %    Oxygen Saturation (Exit) 95 %    Rating of Perceived Exertion (Exercise) 15    Symptoms none    Duration Continue with 30 min of aerobic exercise without signs/symptoms of physical distress.    Intensity THRR unchanged      Progression   Progression Continue to progress workloads to maintain intensity without signs/symptoms of physical distress.    Average METs 10.45      Resistance Training   Training Prescription Yes    Wong 12lb    Reps 10-15      Interval Training   Interval Training No      Treadmill   MPH 3.6    Grade 14    Minutes 15    METs 10.7  NuStep   Level 1      Home Exercise Plan    Plans to continue exercise at [facility] (comment)   [employer]   Frequency Add 2 additional days to program exercise sessions.    Initial Home Exercises Provided 11/19/20             Nutrition:  Target Goals: Understanding of nutrition guidelines, daily intake of sodium '1500mg'$ , cholesterol '200mg'$ , calories 30% from fat and 7% or less from saturated fats, daily to have 5 or more servings of fruits and vegetables.  Education: All About Nutrition: -Group instruction provided by verbal, written material, interactive activities, discussions, models, and posters to present general guidelines for heart healthy nutrition including fat, fiber, MyPlate, the role of sodium in heart healthy nutrition, utilization of the nutrition label, and utilization of this knowledge for meal planning. Follow up email sent as well. Written material given at graduation. Flowsheet Row Cardiac Rehab from 12/29/2020 in [facility] Cardiac and Pulmonary Rehab  Education need identified 11/01/20  Date 12/08/20  Educator [place]  Instruction Review Code 1- Verbalizes Understanding       Biometrics:  Pre Biometrics - 11/01/20 0926       Pre Biometrics   Height 5' 8.8" (1.748 m)    Wong 184 lb 4.8 oz (83.6 kg)    BMI (Calculated) 27.36    Single Leg Stand 30 seconds              Nutrition Therapy Plan and Nutrition Goals:  Nutrition Therapy & Goals - 11/15/20 1334       Nutrition Therapy   Diet Heart healthy, low Na    Protein (specify units) 65g    Fiber 30 grams    Whole Grain Foods 3 servings    Saturated Fats 12 max. grams    Fruits and Vegetables 8 servings/day    Sodium 1.5 grams      Personal Nutrition Goals   Nutrition Goal ST: honor hunger - this may mean including more satisfying snacks LT: manage portion sizes of dinner meal    Comments FODMAPs - recommended by gastroenterologist, waiting to see what the colonoscopy results. Colonoscopy in Augusts. B: special K cereal (almond milk) S:  carrots/celery L: sandwich ([place], ham, or chicken with pickles on whole wheat bread) with some chips like doritos S: carrots ans celery D: limiting fast food - he gets take out from restaurants. He will have vegetables and meatloaf, chicken and dumplings, 6 oz steak. He reports cooking some salmon with grilled mixed vegetables. Will also have an apple a day. Drinks: water, coffee - black , propel or gateraide mixed drinks 1x/day. Discussed heart healthy eating.      Intervention Plan   Intervention Prescribe, educate and counsel regarding individualized specific dietary modifications aiming towards targeted core components such as Wong, hypertension, lipid management, diabetes, heart failure and other comorbidities.    Expected Outcomes Short Term Goal: Understand basic principles of dietary content, such as calories, fat, sodium, cholesterol and nutrients.;Short Term Goal: A plan has been developed with personal nutrition goals set during dietitian appointment.;Long Term Goal: Adherence to prescribed nutrition plan.             Nutrition Assessments:  MEDIFICTS Score Key: ?70 Need to make dietary changes  40-70 Heart Healthy Diet ? 40 Therapeutic Level Cholesterol Diet  Flowsheet Row Cardiac Rehab from 11/01/2020 in [facility] Cardiac and Pulmonary Rehab  Picture Your Plate Total Score on Admission 68  Picture Your Plate Scores: <63 Unhealthy dietary pattern with much room for improvement. 41-50 Dietary pattern unlikely to meet recommendations for good health and room for improvement. 51-60 More healthful dietary pattern, with some room for improvement.  >60 Healthy dietary pattern, although there may be some specific behaviors that could be improved.    Nutrition Goals Re-Evaluation:  Nutrition Goals Re-Evaluation     Row Name 12/20/20 0744             Goals   Comment Cale is eating more fruits and vegetables.  He does feel like he is staisfied with what he eats - not  overly hungry between meals.  He does have some protein with meals and snacks.       Expected Outcome Short:  maintain eating fruits and vegetables Long: continue heart healthy diet                Nutrition Goals Discharge (Final Nutrition Goals Re-Evaluation):  Nutrition Goals Re-Evaluation - 12/20/20 0744       Goals   Comment Frank Wong is eating more fruits and vegetables.  He does feel like he is staisfied with what he eats - not overly hungry between meals.  He does have some protein with meals and snacks.    Expected Outcome Short:  maintain eating fruits and vegetables Long: continue heart healthy diet             Psychosocial: Target Goals: Acknowledge presence or absence of significant depression and/or stress, maximize coping skills, provide positive support system. Participant is able to verbalize types and ability to use techniques and skills needed for reducing stress and depression.   Education: Stress, Anxiety, and Depression - Group verbal and visual presentation to define topics covered.  Reviews how body is impacted by stress, anxiety, and depression.  Also discusses healthy ways to reduce stress and to treat/manage anxiety and depression.  Written material given at graduation.   Education: Sleep Hygiene -Provides group verbal and written instruction about how sleep can affect your health.  Define sleep hygiene, discuss sleep cycles and impact of sleep habits. Review good sleep hygiene tips.    Initial Review & Psychosocial Screening:  Initial Psych Review & Screening - 10/26/20 0942       Initial Review   Current issues with History of Depression;Current Psychotropic Meds;Current Depression;Current Sleep Concerns;Current Stress Concerns    Source of Stress Concerns Chronic Illness;Unable to participate in former interests or hobbies;Unable to perform yard/household activities    Comments currently feeling managed on meds, dealing with back issues and SOB since  all this has happened      Harmon? Yes   wife and children     Barriers   Psychosocial barriers to participate in program The patient should benefit from training in stress management and relaxation.;Psychosocial barriers identified (see note)      Screening Interventions   Interventions To provide support and resources with identified psychosocial needs;Provide feedback about the scores to participant;Encouraged to exercise    Expected Outcomes Short Term goal: Utilizing psychosocial counselor, staff and physician to assist with identification of specific Stressors or current issues interfering with healing process. Setting desired goal for each stressor or current issue identified.;Long Term Goal: Stressors or current issues are controlled or eliminated.;Short Term goal: Identification and review with participant of any Quality of Life or Depression concerns found by scoring the questionnaire.;Long Term goal: The participant improves quality of Life and PHQ9 Scores  as seen by post scores and/or verbalization of changes             Quality of Life Scores:   Quality of Life - 11/01/20 0927       Quality of Life   Select Quality of Life      Quality of Life Scores   Health/Function Pre 18.57 %    Socioeconomic Pre 22.06 %    Psych/Spiritual Pre 24.93 %    Family Pre 28.8 %    GLOBAL Pre 22.1 %            Scores of 19 and below usually indicate a poorer quality of life in these areas.  A difference of  2-3 points is a clinically meaningful difference.  A difference of 2-3 points in the total score of the Quality of Life Index has been associated with significant improvement in overall quality of life, self-image, physical symptoms, and general health in studies assessing change in quality of life.  PHQ-9: Recent Review Flowsheet Data     Depression screen [facility] 2/9 11/01/2020   Decreased Interest 0   Down, Depressed, Hopeless 0   PHQ - 2 Score 0    Altered sleeping 1   Tired, decreased energy 1   Change in appetite 0   Feeling bad or failure about yourself  0   Trouble concentrating 1   Moving slowly or fidgety/restless 0   Suicidal thoughts 0   PHQ-9 Score 3   Difficult doing work/chores Somewhat difficult      Interpretation of Total Score  Total Score Depression Severity:  1-4 = Minimal depression, 5-9 = Mild depression, 10-14 = Moderate depression, 15-19 = Moderately severe depression, 20-27 = Severe depression   Psychosocial Evaluation and Intervention:  Psychosocial Evaluation - 10/26/20 0950       Psychosocial Evaluation & Interventions   Interventions Encouraged to exercise with the program and follow exercise prescription;Stress management education    Comments Frank Wong is coming into rehab after his 3rd stent.  This will be his first time through  as he was previously very active and refereeing soccer.  This time, however, he was not able to bounce back as readily and not able to do soccer all season.  He would like to be ready for the fall season.   He has also been struggling with shortness of breath and would like to get that under better control.  He is already back to work in [employer] on the computer.  He has a great support system with his family. He does have a history of depression but is currently well managed on klonopin.  He also uses a BiPap at night routinuely to sleep.  Overall, he is doing well.    Expected Outcomes Short: Attend rehab to build stamina and improve SOB Long: Continue to stay positive    Continue Psychosocial Services  Follow up required by staff             Psychosocial Re-Evaluation:  Psychosocial Re-Evaluation     [place] Name 11/19/20 0756 12/20/20 0747           Psychosocial Re-Evaluation   Current issues with Current Stress Concerns Current Stress Concerns      Comments Frank Wong is doing well mentally.  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  He is eager to get back and  hoping by fall he can go again. He sleeps pretty good overall. Otherwise, work is stressful but he copes  as best he is able. Frank Wong is back to [employer] soccer.  He felt his endurance is not quite back, however is was very hot.    Sleep is good overall.  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  He felt his endurance is not quite back, however is was very hot.    Sleep is good overall.  No new stress concerns    Expected Outcomes Short:  continue to exercise to get endurance back Long:  maintain positive outlook             Vocational Rehabilitation: Provide vocational rehab assistance to qualifying candidates.   Vocational Rehab Evaluation & Intervention:  Vocational Rehab - 10/26/20 0941       Initial Vocational Rehab Evaluation & Intervention   Assessment shows need for Vocational Rehabilitation No             Education: Education Goals: Education classes will be provided on a variety of topics geared toward better understanding of heart health and risk factor modification. Participant will state understanding/return demonstration of topics presented as noted by education test scores.  Learning Barriers/Preferences:  Learning Barriers/Preferences - 10/26/20 0940       Learning Barriers/Preferences   Learning Barriers Sight   glasses   Learning Preferences None             General Cardiac  Education Topics:  AED/CPR: - Group verbal and written instruction with the use of models to demonstrate the basic use of the AED with the basic ABC's of resuscitation.   Anatomy and Cardiac Procedures: - Group verbal and visual presentation and models provide information about basic cardiac anatomy and function. Reviews the testing methods done to diagnose heart disease and the outcomes of the test results. Describes the treatment choices: Medical Management, Angioplasty, or Coronary Bypass Surgery for treating various heart conditions including Myocardial Infarction, Angina, Valve Disease, and Cardiac Arrhythmias.  Written material given at graduation. Flowsheet Row Cardiac Rehab from 12/29/2020 in [facility] Cardiac and Pulmonary Rehab  Education need identified 11/01/20  Date 11/24/20  Educator SB  Instruction Review Code 1- Verbalizes Understanding       Medication Safety: - Group verbal and visual instruction to review commonly prescribed medications for heart and lung disease. Reviews the medication, class of the drug, and side effects. Includes the steps to properly store meds and maintain the prescription regimen.  Written material given at graduation. Flowsheet Row Cardiac Rehab from 12/29/2020 in [facility] Cardiac and Pulmonary Rehab  Date 12/15/20  Educator SB  Instruction Review Code 1- Verbalizes Understanding       Intimacy: - Group verbal instruction through game format to discuss how heart and lung disease can affect sexual intimacy. Written material given at graduation.. Flowsheet Row Cardiac Rehab from 12/29/2020 in [facility] Cardiac and Pulmonary Rehab  Date 11/17/20  Educator [facility]  Instruction Review Code 1- Verbalizes Understanding       Know Your Numbers and Heart Failure: - Group verbal and visual instruction to discuss disease risk factors for cardiac and pulmonary disease and treatment options.  Reviews associated critical values for Overweight/Obesity, Hypertension,  Cholesterol, and Diabetes.  Discusses basics of heart failure: signs/symptoms and treatments.  Introduces Heart Failure Zone chart for action plan for heart failure.  Written material given at graduation. Flowsheet Row Cardiac Rehab from 12/29/2020 in [facility] Cardiac and Pulmonary Rehab  Date 12/22/20  Educator SB  Instruction Review Code 1- Verbalizes Understanding       Infection Prevention: - Provides verbal and written material to individual with discussion of infection control including proper hand washing and proper equipment cleaning during exercise session. Flowsheet Row Cardiac Rehab from 12/29/2020 in [facility] Cardiac and Pulmonary Rehab  Date 11/01/20  Educator [facility]  Instruction Review Code 1- Verbalizes Understanding       Falls Prevention: - Provides verbal and written material to individual with discussion of falls prevention and safety. Flowsheet Row Cardiac Rehab from 12/29/2020 in [facility] Cardiac and Pulmonary Rehab  Date 11/01/20  Educator [facility]  Instruction Review Code 1- Verbalizes Understanding       Other: -Provides group and verbal instruction on various topics (see comments)   Knowledge Questionnaire Score:  Knowledge Questionnaire Score - 11/01/20 9532       Knowledge Questionnaire Score   Pre Score 21/26 Education Focus: MI, chest pain, nurtition, exercise             Core Components/Risk Factors/Patient Goals at Admission:  Personal Goals and Risk Factors at Admission - 11/01/20 0929       Core Components/Risk Factors/Patient Goals on Admission    Wong Management Yes;Wong Loss    Intervention Wong Management: Develop a combined nutrition and exercise program designed to reach desired caloric intake, while maintaining appropriate intake of nutrient and fiber, sodium and fats, and appropriate energy expenditure required for the Wong goal.;Wong Management: Provide education and appropriate resources to help participant work on and attain dietary goals.     Admit Wong 184 lb 4.8 oz (83.6 kg)    Goal Wong: Short Term 180 lb (81.6 kg)    Goal Wong: Long Term 175 lb (79.4 kg)    Expected Outcomes Short Term: Continue to assess and modify interventions until short term Wong is achieved;Long Term: Adherence to nutrition and physical activity/exercise program aimed toward attainment of established Wong goal;Wong Loss: Understanding of general recommendations for a balanced deficit meal plan, which promotes 1-2 lb Wong loss per week and includes a negative energy balance of [phone] kcal/d;Understanding of distribution of calorie intake throughout the day with the consumption of 4-5 meals/snacks;Understanding recommendations for meals to include 15-35% energy as protein, 25-35% energy from fat, 35-60% energy from carbohydrates, less than $RemoveB'200mg'zrxuCmMB$  of dietary cholesterol, 20-35 gm of total fiber daily    Diabetes Yes    Intervention Provide education about signs/symptoms and action to take for hypo/hyperglycemia.;Provide education about proper nutrition, including hydration, and aerobic/resistive exercise prescription along with prescribed medications to achieve blood glucose in normal ranges: Fasting glucose 65-99 mg/dL    Expected Outcomes Short Term: Participant verbalizes understanding of the signs/symptoms and immediate care of hyper/hypoglycemia, proper foot care and importance of medication, aerobic/resistive exercise and nutrition plan for blood glucose control.;Long Term: Attainment of HbA1C < 7%.    Hypertension Yes    Intervention Provide education on lifestyle modifcations including regular  physical activity/exercise, Wong management, moderate sodium restriction and increased consumption of fresh fruit, vegetables, and low fat dairy, alcohol moderation, and smoking cessation.;Monitor prescription use compliance.    Expected Outcomes Short Term: Continued assessment and intervention until BP is < 140/11mm HG in hypertensive participants. <  130/30mm HG in hypertensive participants with diabetes, heart failure or chronic kidney disease.;Long Term: Maintenance of blood pressure at goal levels.    Lipids Yes    Intervention Provide education and support for participant on nutrition & aerobic/resistive exercise along with prescribed medications to achieve LDL '70mg'$ , HDL >$Remo'40mg'RGOtt$ .    Expected Outcomes Short Term: Participant states understanding of desired cholesterol values and is compliant with medications prescribed. Participant is following exercise prescription and nutrition guidelines.;Long Term: Cholesterol controlled with medications as prescribed, with individualized exercise RX and with personalized nutrition plan. Value goals: LDL < $Rem'70mg'WZDF$ , HDL > 40 mg.             Education:Diabetes - Individual verbal and written instruction to review signs/symptoms of diabetes, desired ranges of glucose level fasting, after meals and with exercise. Acknowledge that pre and post exercise glucose checks will be done for 3 sessions at entry of program. Flowsheet Row Cardiac Rehab from 12/29/2020 in [facility] Cardiac and Pulmonary Rehab  Date 11/01/20  Educator [facility]  Instruction Review Code 1- Verbalizes Understanding       Core Components/Risk Factors/Patient Goals Review:   Goals and Risk Factor Review     Row Name 11/19/20 0754 12/20/20 0739           Core Components/Risk Factors/Patient Goals Review   Personal Goals Review Wong Management/Obesity;Diabetes;Hypertension Wong Management/Obesity;Diabetes;Hypertension      Review Frank Wong is off to a good start in rehab. So far his Wong has been staying the same.  He is doing well with his blood pressures and he does check them at home.  His sugars have been going up and down.  He checks them daily in the morning and running between 125-180 mg/dl.  He does try to get in protein at night. Frank Wong has been steady.  He checks BP and BG at home - FBG around 120, which is more steady than  before.  Resting BP todat 112/64.  We reviewed noticing how clothes fit,etc, not just Wong on scale, and the importance of exercise in managing risk factors.      Expected Outcomes Short: Continue to work on diabetes management with diet.  Long: continue to monitor risk factors. Short:  continue to exercise to help manage BG Long: manage risk factors long term               Core Components/Risk Factors/Patient Goals at Discharge (Final Review):   Goals and Risk Factor Review - 12/20/20 0739       Core Components/Risk Factors/Patient Goals Review   Personal Goals Review Wong Management/Obesity;Diabetes;Hypertension    Review Frank Wong has been steady.  He checks BP and BG at home - FBG around 120, which is more steady than before.  Resting BP todat 112/64.  We reviewed noticing how clothes fit,etc, not just Wong on scale, and the importance of exercise in managing risk factors.    Expected Outcomes Short:  continue to exercise to help manage BG Long: manage risk factors long term             ITP Comments:  ITP Comments     Row Name 10/26/20 [phone] 11/01/20 0919 11/03/20 0735 11/10/20 0854 11/15/20 1425  ITP Comments Completed virtual orientation today.  EP evaluation is scheduled for Monday 6/6 at 8am.  Documentation for diagnosis can be found in [facility] encounter 10/06/20. Completed 6MWT and gym orientation. Initial ITP created and sent for review to Dr. Emily Filbert, Medical Director. First full day of exercise!  Patient was oriented to gym and equipment including functions, settings, policies, and procedures.  Patient's individual exercise prescription and treatment plan were reviewed.  All starting workloads were established based on the results of the 6 minute walk test done at initial orientation visit.  The plan for exercise progression was also introduced and progression will be customized based on patient's performance and goals. 30 Day review completed. Medical Director ITP  review done, changes made as directed, and signed approval by Medical Director.  new to program Completed initial RD consultation    [place] Name 12/08/20 1127 01/05/21 0800         ITP Comments 30 Day review completed. Medical Director ITP review done, changes made as directed, and signed approval by Medical Director. 30 Day review completed. Medical Director ITP review done, changes made as directed, and signed approval by Medical Director.               Comments:

## 2021-01-05 NOTE — Progress Notes (Signed)
Discharge Progress Report  Patient Details  Name: Frank Wong MRN: [id number] Date of Birth: Feb 20, 1956 Referring Provider:   Flowsheet Row Cardiac Rehab from 11/01/2020 in [facility]  Referring Provider [name] MD        Number of Visits: 36  Reason for Discharge:  Patient reached a stable level of exercise. Patient independent in their exercise. Patient has met program and personal goals.  Smoking History:  Social History   Tobacco Use  Smoking Status Former  Smokeless Tobacco Former    Diagnosis:  Status post coronary artery stent placement  ADL UCSD:   Initial Exercise Prescription:  Initial Exercise Prescription - 11/01/20 0900       Date of Initial Exercise RX and Referring Provider   Date 11/01/20    Referring Provider Paraschos, Alexander MD      Treadmill   MPH 2.7    Grade 1    Minutes 15    METs 3.44      Recumbant Bike   Level 3    RPM 50    Watts 47    Minutes 15    METs 3.5      REL-XR   Level 3    Speed 50    Minutes 15    METs 3.5      Track   Laps 40    Minutes 15    METs 3.2      Prescription Details   Frequency (times per week) 3    Duration Progress to 30 minutes of continuous aerobic without signs/symptoms of physical distress      Intensity   THRR 40-80% of Max Heartrate 108-141    Ratings of Perceived Exertion 11-13    Perceived Dyspnea 0-4      Progression   Progression Continue to progress workloads to maintain intensity without signs/symptoms of physical distress.      Resistance Training   Training Prescription Yes    Weight 4 lb    Reps 10-15             Discharge Exercise Prescription (Final Exercise Prescription Changes):  Exercise Prescription Changes - 01/04/21 1300       Response to Exercise   Blood Pressure (Admit) 112/62    Blood Pressure (Exit) 110/62    Heart Rate (Admit) 75 bpm    Heart Rate (Exercise) 132 bpm    Heart Rate (Exit) 133 bpm     Oxygen Saturation (Admit) 96 %    Oxygen Saturation (Exercise) 95 %    Oxygen Saturation (Exit) 95 %    Rating of Perceived Exertion (Exercise) 15    Symptoms none    Duration Continue with 30 min of aerobic exercise without signs/symptoms of physical distress.    Intensity THRR unchanged      Progression   Progression Continue to progress workloads to maintain intensity without signs/symptoms of physical distress.    Average METs 10.45      Resistance Training   Training Prescription Yes    Weight 12lb    Reps 10-15      Interval Training   Interval Training No      Treadmill   MPH 3.6    Grade 14    Minutes 15    METs 10.7      NuStep   Level 1      Home Exercise Plan   Plans to continue exercise at [employer] (comment)   [employer]   Frequency  Add 2 additional days to program exercise sessions.    Initial Home Exercises Provided 11/19/20             Functional Capacity:  6 Minute Walk     Row Name 11/01/20 0920 12/29/20 0904       6 Minute Walk   Phase Initial Discharge    Distance 1455 feet 1820 feet    Distance % Change -- 25 %    Walk Time 6 minutes 6 minutes    # of Rest Breaks 0 0    MPH 2.76 3.44    METS 3.78 4.67    RPE 9 10    Perceived Dyspnea  1 0    VO2 Peak 13.22 16.35    Symptoms No No    Resting HR 74 bpm 67 bpm    Resting BP 134/72 104/54    Resting Oxygen Saturation  98 % 96 %    Exercise Oxygen Saturation  during 6 min walk 98 % 93 %    Max Ex. HR 110 bpm 117 bpm    Max Ex. BP 136/70 160/68    2 Minute Post BP 114/66 --             Psychological, QOL, Others - Outcomes: PHQ 2/9: Depression screen [facility] 2/9 01/05/2021 11/01/2020  Decreased Interest 0 0  Down, Depressed, Hopeless 1 0  PHQ - 2 Score 1 0  Altered sleeping 1 1  Tired, decreased energy 1 1  Change in appetite 0 0  Feeling bad or failure about yourself  0 0  Trouble concentrating 1 1  Moving slowly or fidgety/restless 0 0  Suicidal thoughts 0 0  PHQ-9  Score 4 3  Difficult doing work/chores Not difficult at all Somewhat difficult    Quality of Life:  Quality of Life - 01/05/21 0819       Quality of Life Scores   Health/Function Pre 18.57 %    Health/Function Post 26.5 %    Health/Function % Change 42.7 %    Socioeconomic Pre 22.06 %    Socioeconomic Post 25.31 %    Socioeconomic % Change  14.73 %    Psych/Spiritual Pre 24.93 %    Psych/Spiritual Post 26.14 %    Psych/Spiritual % Change 4.85 %    Family Pre 28.8 %    Family Post 28.8 %    Family % Change 0 %    GLOBAL Pre 22.1 %    GLOBAL Post 26.49 %    GLOBAL % Change 19.86 %             Nutrition & Weight - Outcomes:  Pre Biometrics - 11/01/20 0926       Pre Biometrics   Height 5' 8.8" (1.748 m)    Weight 184 lb 4.8 oz (83.6 kg)    BMI (Calculated) 27.36    Single Leg Stand 30 seconds              Nutrition:  Nutrition Therapy & Goals - 11/15/20 1334       Nutrition Therapy   Diet Heart healthy, low Na    Protein (specify units) 65g    Fiber 30 grams    Whole Grain Foods 3 servings    Saturated Fats 12 max. grams    Fruits and Vegetables 8 servings/day    Sodium 1.5 grams      Personal Nutrition Goals   Nutrition Goal ST: honor hunger - this may mean including more  satisfying snacks LT: manage portion sizes of dinner meal    Comments FODMAPs - recommended by gastroenterologist, waiting to see what the colonoscopy results. Colonoscopy in Augusts. B: special K cereal (almond milk) S: carrots/celery L: sandwich ([place], ham, or chicken with pickles on whole wheat bread) with some chips like doritos S: carrots ans celery D: limiting fast food - he gets take out from restaurants. He will have vegetables and meatloaf, chicken and dumplings, 6 oz steak. He reports cooking some salmon with grilled mixed vegetables. Will also have an apple a day. Drinks: water, coffee - black , propel or gateraide mixed drinks 1x/day. Discussed heart healthy eating.       Intervention Plan   Intervention Prescribe, educate and counsel regarding individualized specific dietary modifications aiming towards targeted core components such as weight, hypertension, lipid management, diabetes, heart failure and other comorbidities.    Expected Outcomes Short Term Goal: Understand basic principles of dietary content, such as calories, fat, sodium, cholesterol and nutrients.;Short Term Goal: A plan has been developed with personal nutrition goals set during dietitian appointment.;Long Term Goal: Adherence to prescribed nutrition plan.             Nutrition Discharge:   Education Questionnaire Score:  Knowledge Questionnaire Score - 01/05/21 0817       Knowledge Questionnaire Score   Pre Score 21/26 Education Focus: MI, chest pain, nurtition, exercise    Post Score 25/26             Goals reviewed with patient; copy given to patient.

## 2021-01-10 ENCOUNTER — Encounter
Admission: RE | Disposition: A | Discharge status: Home / Self Care | Payer: Self-pay | Source: Home / Self Care | Attending: Gastroenterology

## 2021-01-10 ENCOUNTER — Encounter: Payer: Self-pay | Admitting: Anesthesiology

## 2021-01-10 ENCOUNTER — Ambulatory Visit
Admission: RE | Admit: 2021-01-10 | Discharge: 2021-01-10 | Disposition: A | Discharge status: Home / Self Care | Payer: 59 | Attending: Gastroenterology | Admitting: Gastroenterology

## 2021-01-10 ENCOUNTER — Ambulatory Visit: Payer: 59 | Admitting: Anesthesiology

## 2021-01-10 DIAGNOSIS — Z7902 Long term (current) use of antithrombotics/antiplatelets: Secondary | ICD-10-CM | POA: Diagnosis not present

## 2021-01-10 DIAGNOSIS — Z7982 Long term (current) use of aspirin: Secondary | ICD-10-CM | POA: Diagnosis not present

## 2021-01-10 DIAGNOSIS — E785 Hyperlipidemia, unspecified: Secondary | ICD-10-CM | POA: Diagnosis not present

## 2021-01-10 DIAGNOSIS — I129 Hypertensive chronic kidney disease with stage 1 through stage 4 chronic kidney disease, or unspecified chronic kidney disease: Secondary | ICD-10-CM | POA: Diagnosis not present

## 2021-01-10 DIAGNOSIS — Z880 Allergy status to penicillin: Secondary | ICD-10-CM | POA: Insufficient documentation

## 2021-01-10 DIAGNOSIS — K635 Polyp of colon: Secondary | ICD-10-CM | POA: Insufficient documentation

## 2021-01-10 DIAGNOSIS — Z79899 Other long term (current) drug therapy: Secondary | ICD-10-CM | POA: Diagnosis not present

## 2021-01-10 DIAGNOSIS — R194 Change in bowel habit: Secondary | ICD-10-CM | POA: Insufficient documentation

## 2021-01-10 DIAGNOSIS — I2510 Atherosclerotic heart disease of native coronary artery without angina pectoris: Secondary | ICD-10-CM | POA: Diagnosis not present

## 2021-01-10 DIAGNOSIS — Z7984 Long term (current) use of oral hypoglycemic drugs: Secondary | ICD-10-CM | POA: Insufficient documentation

## 2021-01-10 DIAGNOSIS — K529 Noninfective gastroenteritis and colitis, unspecified: Secondary | ICD-10-CM | POA: Diagnosis not present

## 2021-01-10 HISTORY — DX: Chronic obstructive pulmonary disease, unspecified: J44.9

## 2021-01-10 HISTORY — PX: COLONOSCOPY WITH PROPOFOL: SHX5780

## 2021-01-10 HISTORY — DX: Cardiac arrhythmia, unspecified: I49.9

## 2021-01-10 HISTORY — DX: Alcohol dependence, uncomplicated: F10.20

## 2021-01-10 HISTORY — DX: Depression, unspecified: F32.A

## 2021-01-10 HISTORY — DX: Hyperlipidemia, unspecified: E78.5

## 2021-01-10 HISTORY — DX: Sick sinus syndrome: I49.5

## 2021-01-10 HISTORY — DX: Essential tremor: G25.0

## 2021-01-10 HISTORY — DX: Cardiac murmur, unspecified: R01.1

## 2021-01-10 HISTORY — DX: Chronic kidney disease, unspecified: N18.9

## 2021-01-10 HISTORY — DX: Irritable bowel syndrome, unspecified: K58.9

## 2021-01-10 LAB — GLUCOSE, CAPILLARY: Glucose-Capillary: 119 mg/dL — ABNORMAL HIGH (ref 70–99)

## 2021-01-10 SURGERY — COLONOSCOPY WITH PROPOFOL
Anesthesia: General

## 2021-01-10 MED ORDER — PROPOFOL 500 MG/50ML IV EMUL
INTRAVENOUS | Status: DC | PRN
  Administered 2021-01-10: 120 ug/kg/min via INTRAVENOUS

## 2021-01-10 MED ORDER — SODIUM CHLORIDE 0.9 % IV SOLN
INTRAVENOUS | Status: DC
  Administered 2021-01-10: 1000 mL via INTRAVENOUS

## 2021-01-10 NOTE — Anesthesia Preprocedure Evaluation (Signed)
Anesthesia Evaluation  Patient identified by MRN, date of birth, ID band Patient awake    Reviewed: Allergy & Precautions, NPO status , Patient's Chart, lab work & pertinent test results  History of Anesthesia Complications (+) history of anesthetic complications (iv injection of local, sz and cardiovascular collapse)  Airway Mallampati: II  TM Distance: >3 FB Neck ROM: Full    Dental no notable dental hx.    Pulmonary asthma , sleep apnea and Continuous Positive Airway Pressure Ventilation , COPD, former smoker,    Pulmonary exam normal        Cardiovascular hypertension, Pt. on medications + angina with exertion + CAD, + Past MI and + Cardiac Stents  (-) CHF Normal cardiovascular exam(-) dysrhythmias (-) Valvular Problems/Murmurs     Neuro/Psych neg Seizures PSYCHIATRIC DISORDERS Depression    GI/Hepatic Neg liver ROS, GERD  Medicated,  Endo/Other  diabetes, Type 2, Oral Hypoglycemic Agents  Renal/GU Renal InsufficiencyRenal disease     Musculoskeletal   Abdominal   Peds  Hematology negative hematology ROS (+)   Anesthesia Other Findings . Alcoholism (CMS-HCC)  . Allergy 1996  . Anesthesia complication  cardiac arrest wtih erronous placement of nerve blockage into vascular system with last shoulder surgery done at [facility].  . Arthritis  . Asthma, exercise induced  . Cardiac arrhythmia  . Chronic epigastric pain 11/24/2013  . Chronic kidney disease  . Chronic left shoulder pain 04/07/2016  . COPD (chronic obstructive pulmonary disease) (CMS-HCC)  . Coronary artery disease  . Depression  . Dermatitis 2014  . Diabetes mellitus type 2, diet-controlled (CMS-HCC)  . Dysphagia, pharyngeal 01/22/2017  . Epididymal pain 11/24/2013  . Essential tremor  . Fingers fractured 1970 1971  bilateral  . Gastritis 2015 EGD  . GERD (gastroesophageal reflux disease) 2009  . H. pylori infection  . Heart attack (CMS-HCC) 2010   . Heart murmur, unspecified 1977  . Hoarseness 01/22/2017  . Hyperlipemia  . Hypertension  . Internal hemorrhoids  . Irritable bowel syndrome 2016  . Multiple gastric erosions 11/24/2013  . Renal insufficiency  . Sinoatrial node dysfunction (CMS-HCC)  . Sleep apnea, obstructive  uses BI-PAP, instructed to bring on DOS  . Substance abuse (CMS-HCC) 1982  . Tendinitis of left rotator cuff  . Tremor 2012  CAD Stent May, 2022   Reproductive/Obstetrics                             Anesthesia Physical  Anesthesia Plan  ASA: 3  Anesthesia Plan: General   Post-op Pain Management:    Induction: Intravenous  PONV Risk Score and Plan: 2 and TIVA, Propofol infusion and Treatment may vary due to age or medical condition  Airway Management Planned: Nasal Cannula  Additional Equipment:   Intra-op Plan:   Post-operative Plan:   Informed Consent: I have reviewed the patients History and Physical, chart, labs and discussed the procedure including the risks, benefits and alternatives for the proposed anesthesia with the patient or authorized representative who has indicated his/her understanding and acceptance.       Plan Discussed with: CRNA, Anesthesiologist and Surgeon  Anesthesia Plan Comments:         Anesthesia Quick Evaluation

## 2021-01-10 NOTE — Transfer of Care (Signed)
Immediate Anesthesia Transfer of Care Note  Patient: Frank Wong  Procedure(s) Performed: COLONOSCOPY WITH PROPOFOL  Patient Location: PACU  Anesthesia Type:General  Level of Consciousness: awake and sedated  Airway & Oxygen Therapy: Patient Spontanous Breathing and Patient connected to nasal cannula oxygen  Post-op Assessment: Report given to RN and Post -op Vital signs reviewed and stable  Post vital signs: Reviewed and stable  Last Vitals:  Vitals Value Taken Time  BP    Temp    Pulse    Resp    SpO2      Last Pain:  Vitals:   01/10/21 1310  TempSrc: Temporal      Patients Stated Pain Goal: 0 ([id number] [phone])  Complications: No notable events documented.

## 2021-01-10 NOTE — Anesthesia Procedure Notes (Signed)
Date/Time: 01/10/2021 1:53 PM Performed by: Vaughan Sine Pre-anesthesia Checklist: Patient identified, Emergency Drugs available, Suction available, Patient being monitored and Timeout performed Patient Re-evaluated:Patient Re-evaluated prior to induction Oxygen Delivery Method: Nasal cannula Preoxygenation: Pre-oxygenation with 100% oxygen Induction Type: IV induction Placement Confirmation: positive ETCO2 and CO2 detector

## 2021-01-10 NOTE — Anesthesia Postprocedure Evaluation (Signed)
Anesthesia Post Note  Patient: Frank Wong  Procedure(s) Performed: COLONOSCOPY WITH PROPOFOL  Patient location during evaluation: Phase II Anesthesia Type: General Level of consciousness: awake and alert, awake and oriented Pain management: pain level controlled Vital Signs Assessment: post-procedure vital signs reviewed and stable Respiratory status: spontaneous breathing, nonlabored ventilation and respiratory function stable Cardiovascular status: blood pressure returned to baseline and stable Postop Assessment: no apparent nausea or vomiting Anesthetic complications: no   No notable events documented.   Last Vitals:  Vitals:   01/10/21 1406 01/10/21 1426  BP: (!) 93/51 134/66  Pulse:    Resp: 16   Temp: 36.8 C   SpO2:      Last Pain:  Vitals:   01/10/21 1426  TempSrc:   PainSc: 0-No pain                 Phill Mutter

## 2021-01-10 NOTE — H&P (Signed)
Outpatient short stay form Pre-procedure 01/10/2021  Frank Rubenstein, MD  Primary Physician: Frank Hire, MD  Reason for visit:  Change in bowel habits  History of present illness:   65 y/o gentleman with history of CAD on plavix (last dose 5 days ago), hypertension, and HLD here for colonoscopy for loose stools. Had normal colonoscopy in 2015. No abdominal surgeries. No family history of GI malignancies.    Current Facility-Administered Medications:    0.9 %  sodium chloride infusion, , Intravenous, Continuous, Arya Luttrull, Hilton Cork, MD  Medications Prior to Admission  Medication Sig Dispense Refill Last Dose   aspirin EC 81 MG tablet Take 81 mg by mouth in the morning.   01/09/2021 at 0730am   atorvastatin (LIPITOR) 80 MG tablet Take 80 mg by mouth in the morning.   01/09/2021 at 0730am   B Complex-C (B-COMPLEX WITH VITAMIN C) tablet Take 1 tablet by mouth in the morning.   Past Week   benazepril (LOTENSIN) 20 MG tablet Take 20 mg by mouth in the morning.   01/09/2021   CINNAMON PO Take 1,000 mg by mouth in the morning and at bedtime.   Past Week   clonazePAM (KLONOPIN) 2 MG tablet Take 1 mg by mouth at bedtime as needed (sleep/ anxiety).   Past Week   clopidogrel (PLAVIX) 75 MG tablet Take 75 mg by mouth in the morning.   01/06/2021   dicyclomine (BENTYL) 10 MG capsule Take 10 mg by mouth 4 (four) times daily as needed for spasms.   Past Week   empagliflozin (JARDIANCE) 10 MG TABS tablet Take 10 mg by mouth daily.   Past Week   ferrous sulfate 325 (65 FE) MG tablet Take 325 mg by mouth in the morning.   Past Week   fexofenadine (ALLEGRA) 180 MG tablet Take 180 mg by mouth in the morning.   01/09/2021   gabapentin (NEURONTIN) 600 MG tablet Take 300 mg by mouth at bedtime.   01/09/2021 at 2000   GAVILYTE-G 236 g solution Take 4,000 mLs by mouth as directed.   01/09/2021   hyoscyamine (LEVSIN SL) 0.125 MG SL tablet Place 0.125 mg under the tongue every 6 (six) hours as needed (abdominal  spasms).   Past Month   levocetirizine (XYZAL) 5 MG tablet Take 5 mg by mouth every evening.   01/09/2021   magnesium oxide (MAG-OX) 400 MG tablet Take 400 mg by mouth in the morning.   01/09/2021   melatonin 5 MG TABS Take 5 mg by mouth at bedtime as needed (sleep).   Past Week   metFORMIN (GLUCOPHAGE) 1000 MG tablet Take 1,000 mg by mouth 2 (two) times daily with a meal.    Past Week   montelukast (SINGULAIR) 10 MG tablet Take 10 mg by mouth at bedtime.   01/09/2021   Multiple Vitamin (MULTIVITAMIN WITH MINERALS) TABS tablet Take 1 tablet by mouth in the morning.   Past Week   pantoprazole (PROTONIX) 40 MG tablet Take 40 mg by mouth at bedtime.   01/09/2021 at 2000   psyllium (METAMUCIL SMOOTH TEXTURE) 28 % packet Take 1 packet by mouth in the morning.   Past Week   sildenafil (REVATIO) 20 MG tablet Take 10 mg by mouth in the morning and at bedtime.   01/09/2021   Testosterone 20.25 MG/ACT (1.62%) GEL Apply 2 Pump topically in the morning. Applied to shoulders.   01/10/2021   triamcinolone cream (KENALOG) 0.1 % Apply 1 application topically 2 (two) times  daily as needed (skin irritation/itching).   Past Month   EPINEPHrine 0.3 mg/0.3 mL IJ SOAJ injection Inject 0.3 mg into the muscle as needed for anaphylaxis.      glipiZIDE (GLUCOTROL) 10 MG tablet Take 10 mg by mouth every morning.      JARDIANCE 25 MG TABS tablet Take 25 mg by mouth in the morning.      Peppermint Oil (IBGARD PO) Take 2 tablets by mouth in the morning and at bedtime.      umeclidinium-vilanterol (ANORO ELLIPTA) 62.5-25 MCG/INH AEPB Inhale 1 puff into the lungs in the morning. (Patient not taking: Reported on 01/10/2021)   Not Taking     Allergies  Allergen Reactions   Penicillins Other (See Comments)    Has patient had a PCN reaction causing immediate rash, facial/tongue/throat swelling, SOB or lightheadedness with hypotension: No Has patient had a PCN reaction causing severe rash involving mucus membranes or skin necrosis:  No Has patient had a PCN reaction that required hospitalization No Has patient had a PCN reaction occurring within the last 10 years: No If all of the above answers are "NO", then may proceed with Cephalosporin use.    Esomeprazole Magnesium Rash     Past Medical History:  Diagnosis Date   Alcoholism ([place])    Asthma    CAD (coronary artery disease)    Cardiac arrest ([place])    Chronic cardiac arrhythmia    Chronic kidney disease    COPD (chronic obstructive pulmonary disease) (HCC)    Depression    Diabetes mellitus without complication (HCC)    Essential tremor    GERD (gastroesophageal reflux disease)    Heart attack ([place])    Heart murmur    Hyperlipidemia    Hypertension    IBS (irritable bowel syndrome)    Shoulder problem 2010   right   Sinoatrial node dysfunction (HCC)    Sleep apnea     Review of systems:  Otherwise negative.    Physical Exam  Gen: Alert, oriented. Appears stated age.  HEENT: PERRLA. Lungs: No respiratory distress CV: RRR Abd: soft, benign, no masses Ext: No edema    Planned procedures: Proceed with colonoscopy. The patient understands the nature of the planned procedure, indications, risks, alternatives and potential complications including but not limited to bleeding, infection, perforation, damage to internal organs and possible oversedation/side effects from anesthesia. The patient agrees and gives consent to proceed.  Please refer to procedure notes for findings, recommendations and patient disposition/instructions.     Frank Rubenstein, MD [facility] Gastroenterology

## 2021-01-10 NOTE — Op Note (Signed)
[facility] Gastroenterology Patient Name: Frank Wong Procedure Date: 01/10/2021 1:24 PM MRN: [id number] Account #: [id number] Date of Birth: 16-Apr-1956 Admit Type: Outpatient Age: 65 Room: [facility] ENDO ROOM 3 Gender: Male Note Status: Finalized Procedure:             Colonoscopy Indications:           Change in bowel habits, Chronic diarrhea Providers:             Andrey Farmer MD, MD Referring MD:          Andres Labrum, MD (Referring MD) Medicines:             Monitored Anesthesia Care Complications:         No immediate complications. Estimated blood loss:                         Minimal. Procedure:             Pre-Anesthesia Assessment:                        - Prior to the procedure, a History and Physical was                         performed, and patient medications and allergies were                         reviewed. The patient is competent. The risks and                         benefits of the procedure and the sedation options and                         risks were discussed with the patient. All questions                         were answered and informed consent was obtained.                         Patient identification and proposed procedure were                         verified by the physician, the nurse, the anesthetist                         and the technician in the endoscopy suite. Mental                         Status Examination: alert and oriented. Airway                         Examination: normal oropharyngeal airway and neck                         mobility. Respiratory Examination: clear to                         auscultation. CV Examination: normal. Prophylactic  Antibiotics: The patient does not require prophylactic                         antibiotics. Prior Anticoagulants: The patient has                         taken Plavix (clopidogrel), last dose was 5 days prior                         to  procedure. ASA Grade Assessment: II - A patient                         with mild systemic disease. After reviewing the risks                         and benefits, the patient was deemed in satisfactory                         condition to undergo the procedure. The anesthesia                         plan was to use monitored anesthesia care (MAC).                         Immediately prior to administration of medications,                         the patient was re-assessed for adequacy to receive                         sedatives. The heart rate, respiratory rate, oxygen                         saturations, blood pressure, adequacy of pulmonary                         ventilation, and response to care were monitored                         throughout the procedure. The physical status of the                         patient was re-assessed after the procedure.                        After obtaining informed consent, the colonoscope was                         passed under direct vision. Throughout the procedure,                         the patient's blood pressure, pulse, and oxygen                         saturations were monitored continuously. The                         Colonoscope was introduced through the anus and  advanced to the the terminal ileum. The colonoscopy                         was performed without difficulty. The patient                         tolerated the procedure well. The quality of the bowel                         preparation was good. Findings:      The perianal and digital rectal examinations were normal.      The terminal ileum appeared normal.      A 1 mm polyp was found in the descending colon. The polyp was sessile.       The polyp was removed with a jumbo cold forceps. Resection and retrieval       were complete. Estimated blood loss was minimal.      A 1 mm polyp was found in the rectum. The polyp was sessile. The polyp       was  removed with a jumbo cold forceps. Resection and retrieval were       complete. Estimated blood loss was minimal.      Normal mucosa was found in the entire colon. Biopsies for histology were       taken with a cold forceps from the entire colon for evaluation of       microscopic colitis. Estimated blood loss was minimal.      The exam was otherwise without abnormality on direct and retroflexion       views. Impression:            - The examined portion of the ileum was normal.                        - One 1 mm polyp in the descending colon, removed with                         a jumbo cold forceps. Resected and retrieved.                        - One 1 mm polyp in the rectum, removed with a jumbo                         cold forceps. Resected and retrieved.                        - Normal mucosa in the entire examined colon. Biopsied.                        - The examination was otherwise normal on direct and                         retroflexion views. Recommendation:        - Discharge patient to home.                        - Resume previous diet.                        - Resume Plavix (clopidogrel) at  prior dose today.                        - Await pathology results.                        - Repeat colonoscopy for surveillance based on                         pathology results.                        - Return to referring physician as previously                         scheduled. Procedure Code(s):     --- Professional ---                        [phone], Colonoscopy, flexible; with biopsy, single or                         multiple Diagnosis Code(s):     --- Professional ---                        K63.5, Polyp of colon                        K62.1, Rectal polyp                        R19.4, Change in bowel habit                        K52.9, Noninfective gastroenteritis and colitis,                         unspecified CPT copyright 2019 American Medical Association. All rights  reserved. The codes documented in this report are preliminary and upon coder review may  be revised to meet current compliance requirements. Andrey Farmer MD, MD 01/10/2021 2:07:35 PM Number of Addenda: 0 Note Initiated On: 01/10/2021 1:24 PM Scope Withdrawal Time: 0 hours 10 minutes 17 seconds  Total Procedure Duration: 0 hours 16 minutes 27 seconds  Estimated Blood Loss:  Estimated blood loss was minimal.      [facility]

## 2021-01-10 NOTE — Interval H&P Note (Signed)
History and Physical Interval Note:  01/10/2021 1:38 PM  Frank Wong  has presented today for surgery, with the diagnosis of CHANGE IN [facility].  The various methods of treatment have been discussed with the patient and family. After consideration of risks, benefits and other options for treatment, the patient has consented to  Procedure(s): COLONOSCOPY WITH PROPOFOL (N/A) as a surgical intervention.  The patient's history has been reviewed, patient examined, no change in status, stable for surgery.  I have reviewed the patient's chart and labs.  Questions were answered to the patient's satisfaction.     Lesly Rubenstein  Ok to proceed with colonoscopy

## 2021-01-11 ENCOUNTER — Encounter: Payer: Self-pay | Admitting: Gastroenterology

## 2021-01-12 LAB — SURGICAL PATHOLOGY

## 2021-01-17 ENCOUNTER — Encounter: Payer: Self-pay | Admitting: Internal Medicine

## 2021-02-03 ENCOUNTER — Ambulatory Visit (INDEPENDENT_AMBULATORY_CARE_PROVIDER_SITE_OTHER): Payer: 59 | Admitting: Dermatology

## 2021-02-03 ENCOUNTER — Other Ambulatory Visit: Payer: Self-pay

## 2021-02-03 DIAGNOSIS — L299 Pruritus, unspecified: Secondary | ICD-10-CM | POA: Diagnosis not present

## 2021-02-03 DIAGNOSIS — L570 Actinic keratosis: Secondary | ICD-10-CM | POA: Diagnosis not present

## 2021-02-03 DIAGNOSIS — H01134 Eczematous dermatitis of left upper eyelid: Secondary | ICD-10-CM

## 2021-02-03 DIAGNOSIS — H01119 Allergic dermatitis of unspecified eye, unspecified eyelid: Secondary | ICD-10-CM

## 2021-02-03 DIAGNOSIS — Z733 Stress, not elsewhere classified: Secondary | ICD-10-CM

## 2021-02-03 DIAGNOSIS — H01131 Eczematous dermatitis of right upper eyelid: Secondary | ICD-10-CM | POA: Diagnosis not present

## 2021-02-03 DIAGNOSIS — L501 Idiopathic urticaria: Secondary | ICD-10-CM

## 2021-02-03 DIAGNOSIS — L304 Erythema intertrigo: Secondary | ICD-10-CM | POA: Diagnosis not present

## 2021-02-03 MED ORDER — PIMECROLIMUS 1 % EX CREA: TOPICAL_CREAM | Freq: Two times a day (BID) | CUTANEOUS | 11 refills | Status: DC

## 2021-02-03 NOTE — Progress Notes (Signed)
Follow-Up Visit   Subjective  Frank Wong is a 65 y.o. male who presents for the following: Follow-up (Patient here today for 7 month ak follow up. He also reports a rash in groin area for a month and half. Patient reports some issues with bilateral upper eyelids. Patient reports issue at eyelid comes and goes for a couple months now. ).  The following portions of the chart were reviewed this encounter and updated as appropriate:  Tobacco  Allergies  Meds  Problems  Med Hx  Surg Hx  Fam Hx     Review of Systems: No other skin or systemic complaints except as noted in HPI or Assessment and Plan.  Objective  Well appearing patient in no apparent distress; mood and affect are within normal limits.  A focused examination was performed including scalp, bilateral upper eyelids, scrotum. Relevant physical exam findings are noted in the Assessment and Plan.  bilateral upper eyelids Scale and erythema on right upper eyelid  Scalp x 4 (4) Erythematous thin papules/macules with gritty scale.   Assessment & Plan   Pruritus with intertrigo scrotum and groin Pruritus of the scrotum  May be 2ndary to Urticaria and intertrigo  S[facility] mixture intertrigo treatment  use twice a day for 2 weeks  Can use Elidel for scrotum once daily   Intertrigo is a chronic recurrent rash that occurs in skin fold areas that may be associated with friction; heat; moisture; yeast; fungus; and bacteria.  It is exacerbated by increased movement / activity; sweating; and higher atmospheric temperature.  Intertrigo 30g Active Iodoquinole-HTC Iodoquinol: 1% Hydrocortisone: 2.5% Niacinamide: 2% Vehicle: Cream 10  Eyelid dermatitis, possibly allergic/contact Patient has long history of significant environmental allergies bilateral upper eyelids With edema and fissure Chronic and persistent and poorly controlled. Elidel use twice daily for 2 weeks at eyelids - can continue and restart when  needed  Discussed patch testing True Test x36 in future if problem isnt resolved  pimecrolimus (ELIDEL) 1 % cream - bilateral upper eyelids Apply topically 2 (two) times daily. Use for 2 weeks at affected areas of eyelids. Can also use at scrotum once daily.  Actinic keratosis (4) Scalp x 4 Actinic keratoses are precancerous spots that appear secondary to cumulative UV radiation exposure/sun exposure over time. They are chronic with expected duration over 1 year. A portion of actinic keratoses will progress to squamous cell carcinoma of the skin. It is not possible to reliably predict which spots will progress to skin cancer and so treatment is recommended to prevent development of skin cancer.  Recommend daily broad spectrum sunscreen SPF 30+ to sun-exposed areas, reapply every 2 hours as needed.  Recommend staying in the shade or wearing long sleeves, sun glasses (UVA+UVB protection) and wide brim hats (4-inch brim around the entire circumference of the hat). Call for new or changing lesions.  Destruction of lesion - Scalp x 4 Complexity: simple   Destruction method: cryotherapy   Informed consent: discussed and consent obtained   Timeout:  patient name, date of birth, surgical site, and procedure verified Lesion destroyed using liquid nitrogen: Yes   Region frozen until ice ball extended beyond lesion: Yes   Outcome: patient tolerated procedure well with no complications   Post-procedure details: wound care instructions given    Long history of chronic idiopathic urticaria exacerbated by stress. Currently under good control. Discussed other treatment options for his flares.  Return in about 1 year (around 02/03/2022) for follow up ak .  IRuthell Rummage, CMA, am acting as scribe for Sarina Ser, MD. Documentation: I have reviewed the above documentation for accuracy and completeness, and I agree with the above.  Sarina Ser, MD

## 2021-02-03 NOTE — Patient Instructions (Addendum)
Actinic keratoses are precancerous spots that appear secondary to cumulative UV radiation exposure/sun exposure over time. They are chronic with expected duration over 1 year. A portion of actinic keratoses will progress to squamous cell carcinoma of the skin. It is not possible to reliably predict which spots will progress to skin cancer and so treatment is recommended to prevent development of skin cancer.  Recommend daily broad spectrum sunscreen SPF 30+ to sun-exposed areas, reapply every 2 hours as needed.  Recommend staying in the shade or wearing long sleeves, sun glasses (UVA+UVB protection) and wide brim hats (4-inch brim around the entire circumference of the hat). Call for new or changing lesions.   Cryotherapy Aftercare  Wash gently with soap and water everyday.   Apply Vaseline and Band-Aid daily until healed.   Instructions for [facility] Medications  One or more of your medications was sent to the [facility] mail order compounding pharmacy. You will receive an email from them and can purchase the medicine through that link. It will then be mailed to your home at the address you confirmed. If for any reason you do not receive an email from them, please check your spam folder. If you still do not find the email, please let us know. [facility] phone number is [phone].    If you have any questions or concerns for your doctor, please call our main line at [phone] and press option 4 to reach your doctor's medical assistant. If no one answers, please leave a voicemail as directed and we will return your call as soon as possible. Messages left after 4 pm will be answered the following business day.   You may also send [place] a message via [place]. We typically respond to MyChart messages within 1-2 business days.  For prescription refills, please ask your pharmacy to contact our office. Our fax number is [phone].  If you have an urgent issue when the clinic is  closed that cannot wait until the next business day, you can page your doctor at the number below.    Please note that while we do our best to be available for urgent issues outside of office hours, we are not available 24/7.   If you have an urgent issue and are unable to reach [place], you may choose to seek medical care at your doctor's office, retail clinic, urgent care center, or emergency room.  If you have a medical emergency, please immediately call 911 or go to the emergency department.  Pager Numbers  - Dr. Nehemiah Massed: [phone]  - Dr. Laurence Ferrari: [phone]  - Dr. Nicole Kindred: [phone]  In the event of inclement weather, please call our main line at [phone] for an update on the status of any delays or closures.  Dermatology Medication Tips: Please keep the boxes that topical medications come in in order to help keep track of the instructions about where and how to use these. Pharmacies typically print the medication instructions only on the boxes and not directly on the medication tubes.   If your medication is too expensive, please contact our office at [phone] option 4 or send [place] a message through [place].   We are unable to tell what your co-pay for medications will be in advance as this is different depending on your insurance coverage. However, we may be able to find a substitute medication at lower cost or fill out paperwork to get insurance to cover a needed medication.   If a prior authorization is required to get  your medication covered by your insurance company, please allow [place] 1-2 business days to complete this process.  Drug prices often vary depending on where the prescription is filled and some pharmacies may offer cheaper prices.  The website www.goodrx.com contains coupons for medications through different pharmacies. The prices here do not account for what the cost may be with help from insurance (it may be cheaper with your insurance), but the website can  give you the price if you did not use any insurance.  - You can print the associated coupon and take it with your prescription to the pharmacy.  - You may also stop by our office during regular business hours and pick up a GoodRx coupon card.  - If you need your prescription sent electronically to a different pharmacy, notify our office through [facility] or by phone at [phone] option 4.

## 2021-02-08 ENCOUNTER — Encounter: Payer: Self-pay | Admitting: Dermatology

## 2021-02-18 DIAGNOSIS — F331 Major depressive disorder, recurrent, moderate: Secondary | ICD-10-CM | POA: Insufficient documentation

## 2021-06-21 ENCOUNTER — Other Ambulatory Visit (HOSPITAL_COMMUNITY): Payer: Self-pay | Admitting: Neurology

## 2021-06-21 ENCOUNTER — Other Ambulatory Visit: Payer: Self-pay | Admitting: Neurology

## 2021-06-21 DIAGNOSIS — R519 Headache, unspecified: Secondary | ICD-10-CM

## 2021-06-23 ENCOUNTER — Other Ambulatory Visit
Admission: RE | Admit: 2021-06-23 | Discharge: 2021-06-23 | Disposition: A | Discharge status: Home / Self Care | Payer: 59 | Source: Ambulatory Visit | Attending: Student | Admitting: Student

## 2021-06-23 DIAGNOSIS — I25118 Atherosclerotic heart disease of native coronary artery with other forms of angina pectoris: Secondary | ICD-10-CM | POA: Diagnosis present

## 2021-06-23 LAB — BRAIN NATRIURETIC PEPTIDE: B Natriuretic Peptide: 13.7 pg/mL (ref 0.0–100.0)

## 2021-06-27 ENCOUNTER — Other Ambulatory Visit: Payer: Self-pay

## 2021-06-27 ENCOUNTER — Encounter
Admission: RE | Disposition: A | Discharge status: Home / Self Care | Payer: Self-pay | Source: Home / Self Care | Attending: Internal Medicine

## 2021-06-27 ENCOUNTER — Encounter: Payer: Self-pay | Admitting: Internal Medicine

## 2021-06-27 ENCOUNTER — Ambulatory Visit
Admission: RE | Admit: 2021-06-27 | Discharge: 2021-06-27 | Disposition: A | Discharge status: Home / Self Care | Payer: 59 | Attending: Internal Medicine | Admitting: Internal Medicine

## 2021-06-27 DIAGNOSIS — I200 Unstable angina: Secondary | ICD-10-CM | POA: Diagnosis present

## 2021-06-27 DIAGNOSIS — Z955 Presence of coronary angioplasty implant and graft: Secondary | ICD-10-CM | POA: Diagnosis not present

## 2021-06-27 DIAGNOSIS — E785 Hyperlipidemia, unspecified: Secondary | ICD-10-CM | POA: Diagnosis not present

## 2021-06-27 DIAGNOSIS — E119 Type 2 diabetes mellitus without complications: Secondary | ICD-10-CM | POA: Insufficient documentation

## 2021-06-27 DIAGNOSIS — I25119 Atherosclerotic heart disease of native coronary artery with unspecified angina pectoris: Secondary | ICD-10-CM | POA: Diagnosis not present

## 2021-06-27 DIAGNOSIS — R9430 Abnormal result of cardiovascular function study, unspecified: Secondary | ICD-10-CM

## 2021-06-27 DIAGNOSIS — I2510 Atherosclerotic heart disease of native coronary artery without angina pectoris: Secondary | ICD-10-CM

## 2021-06-27 DIAGNOSIS — I10 Essential (primary) hypertension: Secondary | ICD-10-CM | POA: Diagnosis not present

## 2021-06-27 DIAGNOSIS — R079 Chest pain, unspecified: Secondary | ICD-10-CM

## 2021-06-27 HISTORY — PX: LEFT HEART CATH AND CORONARY ANGIOGRAPHY: CATH118249

## 2021-06-27 LAB — GLUCOSE, CAPILLARY
Glucose-Capillary: 187 mg/dL — ABNORMAL HIGH (ref 70–99)
Glucose-Capillary: 185 mg/dL — ABNORMAL HIGH (ref 70–99)

## 2021-06-27 SURGERY — LEFT HEART CATH AND CORONARY ANGIOGRAPHY
Anesthesia: Moderate Sedation

## 2021-06-27 MED ORDER — DIPHENHYDRAMINE HCL 50 MG/ML IJ SOLN
INTRAMUSCULAR | Status: AC
  Filled 2021-06-27: qty 1

## 2021-06-27 MED ORDER — FENTANYL CITRATE (PF) 100 MCG/2ML IJ SOLN
INTRAMUSCULAR | Status: AC
  Filled 2021-06-27: qty 2

## 2021-06-27 MED ORDER — VERAPAMIL HCL 2.5 MG/ML IV SOLN
INTRAVENOUS | Status: AC
  Filled 2021-06-27: qty 2

## 2021-06-27 MED ORDER — ASPIRIN 81 MG PO CHEW: 81.0000 mg | CHEWABLE_TABLET | ORAL | Status: DC

## 2021-06-27 MED ORDER — DIPHENHYDRAMINE HCL 50 MG/ML IJ SOLN
INTRAMUSCULAR | Status: DC | PRN
  Administered 2021-06-27: 25 mg via INTRAVENOUS

## 2021-06-27 MED ORDER — HEPARIN (PORCINE) IN NACL 1000-0.9 UT/500ML-% IV SOLN
INTRAVENOUS | Status: AC
  Filled 2021-06-27: qty 1000

## 2021-06-27 MED ORDER — SODIUM CHLORIDE 0.9 % WEIGHT BASED INFUSION
3.0000 mL/kg/h | INTRAVENOUS | Status: AC
  Administered 2021-06-27: 3 mL/kg/h via INTRAVENOUS

## 2021-06-27 MED ORDER — SODIUM CHLORIDE 0.9 % IV SOLN: 250.0000 mL | INTRAVENOUS | Status: DC | PRN

## 2021-06-27 MED ORDER — HEPARIN SODIUM (PORCINE) 1000 UNIT/ML IJ SOLN
INTRAMUSCULAR | Status: AC
  Filled 2021-06-27: qty 10

## 2021-06-27 MED ORDER — HYDRALAZINE HCL 20 MG/ML IJ SOLN: 10.0000 mg | INTRAMUSCULAR | Status: DC | PRN

## 2021-06-27 MED ORDER — SODIUM CHLORIDE 0.9 % WEIGHT BASED INFUSION: 1.0000 mL/kg/h | INTRAVENOUS | Status: DC

## 2021-06-27 MED ORDER — VERAPAMIL HCL 2.5 MG/ML IV SOLN
INTRAVENOUS | Status: DC | PRN
  Administered 2021-06-27: 2.5 mg via INTRA_ARTERIAL

## 2021-06-27 MED ORDER — SODIUM CHLORIDE 0.9% FLUSH: 3.0000 mL | Freq: Two times a day (BID) | INTRAVENOUS | Status: DC

## 2021-06-27 MED ORDER — HEPARIN (PORCINE) IN NACL 2000-0.9 UNIT/L-% IV SOLN
INTRAVENOUS | Status: DC | PRN
  Administered 2021-06-27: 1000 mL

## 2021-06-27 MED ORDER — ACETAMINOPHEN 325 MG PO TABS: 650.0000 mg | ORAL_TABLET | ORAL | Status: DC | PRN

## 2021-06-27 MED ORDER — MIDAZOLAM HCL 2 MG/2ML IJ SOLN
INTRAMUSCULAR | Status: DC | PRN
  Administered 2021-06-27: 1 mg via INTRAVENOUS

## 2021-06-27 MED ORDER — HEPARIN SODIUM (PORCINE) 1000 UNIT/ML IJ SOLN
INTRAMUSCULAR | Status: DC | PRN
  Administered 2021-06-27: 4000 [IU] via INTRAVENOUS

## 2021-06-27 MED ORDER — LABETALOL HCL 5 MG/ML IV SOLN: 10.0000 mg | INTRAVENOUS | Status: DC | PRN

## 2021-06-27 MED ORDER — MIDAZOLAM HCL 2 MG/2ML IJ SOLN
INTRAMUSCULAR | Status: AC
  Filled 2021-06-27: qty 2

## 2021-06-27 MED ORDER — LIDOCAINE HCL (PF) 1 % IJ SOLN
INTRAMUSCULAR | Status: DC | PRN
  Administered 2021-06-27: 2 mL

## 2021-06-27 MED ORDER — FENTANYL CITRATE (PF) 100 MCG/2ML IJ SOLN
INTRAMUSCULAR | Status: DC | PRN
  Administered 2021-06-27: 25 ug via INTRAVENOUS

## 2021-06-27 MED ORDER — LIDOCAINE HCL 1 % IJ SOLN
INTRAMUSCULAR | Status: AC
  Filled 2021-06-27: qty 20

## 2021-06-27 MED ORDER — SODIUM CHLORIDE 0.9% FLUSH: 3.0000 mL | INTRAVENOUS | Status: DC | PRN

## 2021-06-27 MED ORDER — ONDANSETRON HCL 4 MG/2ML IJ SOLN: 4.0000 mg | Freq: Four times a day (QID) | INTRAMUSCULAR | Status: DC | PRN

## 2021-06-27 MED ORDER — IOHEXOL 300 MG/ML  SOLN
INTRAMUSCULAR | Status: DC | PRN
  Administered 2021-06-27: 86 mL

## 2021-06-27 SURGICAL SUPPLY — 10 items
CATH 5FR JL3.5 JR4 ANG PIG MP (CATHETERS) ×1 IMPLANT
DEVICE RAD TR BAND REGULAR (VASCULAR PRODUCTS) ×1 IMPLANT
DRAPE BRACHIAL (DRAPES) ×1 IMPLANT
GLIDESHEATH SLEND SS 6F .021 (SHEATH) ×1 IMPLANT
GUIDEWIRE INQWIRE 1.5J.035X260 (WIRE) IMPLANT
INQWIRE 1.5J .035X260CM (WIRE) ×2
PACK CARDIAC CATH (CUSTOM PROCEDURE TRAY) ×2 IMPLANT
PROTECTION STATION PRESSURIZED (MISCELLANEOUS) ×2
SET ATX SIMPLICITY (MISCELLANEOUS) ×1 IMPLANT
STATION PROTECTION PRESSURIZED (MISCELLANEOUS) IMPLANT

## 2021-06-27 NOTE — Progress Notes (Signed)
Dr. Nehemiah Massed in at bedside, speaking extensively with pt. And his wife Kevan Ny re: cath results and plan from here. Both verbalize understanding of conversation.

## 2021-06-30 ENCOUNTER — Other Ambulatory Visit: Payer: Self-pay

## 2021-06-30 ENCOUNTER — Ambulatory Visit
Admission: RE | Admit: 2021-06-30 | Discharge: 2021-06-30 | Disposition: A | Discharge status: Home / Self Care | Payer: 59 | Source: Ambulatory Visit | Attending: Neurology | Admitting: Neurology

## 2021-06-30 DIAGNOSIS — R519 Headache, unspecified: Secondary | ICD-10-CM | POA: Diagnosis present

## 2021-06-30 MED ORDER — GADOBUTROL 1 MMOL/ML IV SOLN
7.5000 mL | Freq: Once | INTRAVENOUS | Status: AC | PRN
  Administered 2021-06-30: 7.5 mL via INTRAVENOUS

## 2021-07-08 DIAGNOSIS — Z951 Presence of aortocoronary bypass graft: Secondary | ICD-10-CM | POA: Insufficient documentation

## 2021-07-09 DIAGNOSIS — H04123 Dry eye syndrome of bilateral lacrimal glands: Secondary | ICD-10-CM | POA: Insufficient documentation

## 2021-07-22 ENCOUNTER — Encounter: Payer: 59 | Attending: Internal Medicine | Admitting: *Deleted

## 2021-07-22 ENCOUNTER — Other Ambulatory Visit: Payer: Self-pay

## 2021-07-22 DIAGNOSIS — J449 Chronic obstructive pulmonary disease, unspecified: Secondary | ICD-10-CM | POA: Insufficient documentation

## 2021-07-22 DIAGNOSIS — Z951 Presence of aortocoronary bypass graft: Secondary | ICD-10-CM | POA: Insufficient documentation

## 2021-07-22 NOTE — Progress Notes (Signed)
Initial telephone orientation completed. Diagnosis can be found in [facility] 2/9. EP orientation scheduled for Monday 3/13 1:30pm

## 2021-08-08 ENCOUNTER — Encounter: Payer: 59 | Attending: Internal Medicine | Admitting: *Deleted

## 2021-08-08 ENCOUNTER — Other Ambulatory Visit: Payer: Self-pay

## 2021-08-08 VITALS — Ht 68.5 in | Wt 171.1 lb

## 2021-08-08 DIAGNOSIS — Z951 Presence of aortocoronary bypass graft: Secondary | ICD-10-CM | POA: Insufficient documentation

## 2021-08-08 NOTE — Patient Instructions (Signed)
Patient Instructions  Patient Details  Name: Frank Wong MRN: [id number] Date of Birth: 15-Feb-1956 Referring Provider:  Corey Skains, MD  Below are your personal goals for exercise, nutrition, and risk factors. Our goal is to help you stay on track towards obtaining and maintaining these goals. We will be discussing your progress on these goals with you throughout the program.  Initial Exercise Prescription:  Initial Exercise Prescription - 08/08/21 1500       Date of Initial Exercise RX and Referring Provider   Date 08/08/21    Referring Provider Nehemiah Massed      Oxygen   Maintain Oxygen Saturation 88% or higher      Treadmill   MPH 3    Grade 0.5    Minutes 15    METs 3.5      Recumbant Bike   Level 1    RPM 60    Minutes 15    METs 3.46      NuStep   Level 2    SPM 80    Minutes 15    METs 3.46      REL-XR   Level 2    Speed 50    Minutes 15    METs 3.46      Biostep-RELP   Level 1    SPM 50    Minutes 15    METs 3.46      Track   Laps 37    Minutes 15    METs 3.01      Prescription Details   Frequency (times per week) 3    Duration Progress to 30 minutes of continuous aerobic without signs/symptoms of physical distress      Intensity   THRR 40-80% of Max Heartrate 103-139    Ratings of Perceived Exertion 11-13    Perceived Dyspnea 0-4      Progression   Progression Continue to progress workloads to maintain intensity without signs/symptoms of physical distress.      Resistance Training   Training Prescription Yes    Weight 3    Reps 10-15             Exercise Goals: Frequency: Be able to perform aerobic exercise two to three times per week in program working toward 2-5 days per week of home exercise.  Intensity: Work with a perceived exertion of 11 (fairly light) - 15 (hard) while following your exercise prescription.  We will make changes to your prescription with you as you progress through the program.   Duration: Be able  to do 30 to 45 minutes of continuous aerobic exercise in addition to a 5 minute warm-up and a 5 minute cool-down routine.   Nutrition Goals: Your personal nutrition goals will be established when you do your nutrition analysis with the dietician.  The following are general nutrition guidelines to follow: Cholesterol < '200mg'$ /day Sodium < '1500mg'$ /day Fiber: Men over 50 yrs - 30 grams per day  Personal Goals:  Personal Goals and Risk Factors at Admission - 08/08/21 1611       Core Components/Risk Factors/Patient Goals on Admission    Weight Management Yes;Weight Loss    Intervention Weight Management: Develop a combined nutrition and exercise program designed to reach desired caloric intake, while maintaining appropriate intake of nutrient and fiber, sodium and fats, and appropriate energy expenditure required for the weight goal.;Weight Management: Provide education and appropriate resources to help participant work on and attain dietary goals.;Weight Management/Obesity: Establish reasonable short term and  long term weight goals.    Admit Weight 171 lb 1.6 oz (77.6 kg)    Goal Weight: Short Term 168 lb (76.2 kg)    Goal Weight: Long Term 165 lb (74.8 kg)    Expected Outcomes Short Term: Continue to assess and modify interventions until short term weight is achieved;Long Term: Adherence to nutrition and physical activity/exercise program aimed toward attainment of established weight goal;Weight Loss: Understanding of general recommendations for a balanced deficit meal plan, which promotes 1-2 lb weight loss per week and includes a negative energy balance of [phone] kcal/d;Understanding recommendations for meals to include 15-35% energy as protein, 25-35% energy from fat, 35-60% energy from carbohydrates, less than '200mg'$  of dietary cholesterol, 20-35 gm of total fiber daily;Understanding of distribution of calorie intake throughout the day with the consumption of 4-5 meals/snacks    Diabetes Yes     Intervention Provide education about signs/symptoms and action to take for hypo/hyperglycemia.;Provide education about proper nutrition, including hydration, and aerobic/resistive exercise prescription along with prescribed medications to achieve blood glucose in normal ranges: Fasting glucose 65-99 mg/dL    Expected Outcomes Short Term: Participant verbalizes understanding of the signs/symptoms and immediate care of hyper/hypoglycemia, proper foot care and importance of medication, aerobic/resistive exercise and nutrition plan for blood glucose control.;Long Term: Attainment of HbA1C < 7%.    Hypertension Yes    Intervention Provide education on lifestyle modifcations including regular physical activity/exercise, weight management, moderate sodium restriction and increased consumption of fresh fruit, vegetables, and low fat dairy, alcohol moderation, and smoking cessation.;Monitor prescription use compliance.    Expected Outcomes Short Term: Continued assessment and intervention until BP is < 140/51m HG in hypertensive participants. < 130/836mHG in hypertensive participants with diabetes, heart failure or chronic kidney disease.;Long Term: Maintenance of blood pressure at goal levels.    Lipids Yes    Intervention Provide education and support for participant on nutrition & aerobic/resistive exercise along with prescribed medications to achieve LDL '70mg'$ , HDL >'40mg'$ .    Expected Outcomes Short Term: Participant states understanding of desired cholesterol values and is compliant with medications prescribed. Participant is following exercise prescription and nutrition guidelines.;Long Term: Cholesterol controlled with medications as prescribed, with individualized exercise RX and with personalized nutrition plan. Value goals: LDL < '70mg'$ , HDL > 40 mg.             Tobacco Use Initial Evaluation: Social History   Tobacco Use  Smoking Status Former  Smokeless Tobacco Former    Exercise Goals and  Review:  Exercise Goals     Row Name 08/08/21 1606             Exercise Goals   Increase Physical Activity Yes       Intervention Provide advice, education, support and counseling about physical activity/exercise needs.;Develop an individualized exercise prescription for aerobic and resistive training based on initial evaluation findings, risk stratification, comorbidities and participant's personal goals.       Expected Outcomes Long Term: Add in home exercise to make exercise part of routine and to increase amount of physical activity.;Short Term: Attend rehab on a regular basis to increase amount of physical activity.;Long Term: Exercising regularly at least 3-5 days a week.       Able to understand and use rate of perceived exertion (RPE) scale Yes       Intervention Provide education and explanation on how to use RPE scale       Expected Outcomes Long Term:  Able to use RPE  to guide intensity level when exercising independently;Short Term: Able to use RPE daily in rehab to express subjective intensity level       Able to understand and use Dyspnea scale Yes       Expected Outcomes Short Term: Able to use Dyspnea scale daily in rehab to express subjective sense of shortness of breath during exertion;Long Term: Able to use Dyspnea scale to guide intensity level when exercising independently       Knowledge and understanding of Target Heart Rate Range (THRR) Yes       Intervention Provide education and explanation of THRR including how the numbers were predicted and where they are located for reference       Expected Outcomes Short Term: Able to state/look up THRR;Long Term: Able to use THRR to govern intensity when exercising independently;Short Term: Able to use daily as guideline for intensity in rehab       Able to check pulse independently Yes       Intervention Provide education and demonstration on how to check pulse in carotid and radial arteries.;Review the importance of being able  to check your own pulse for safety during independent exercise       Expected Outcomes Short Term: Able to explain why pulse checking is important during independent exercise;Long Term: Able to check pulse independently and accurately       Understanding of Exercise Prescription Yes       Intervention Provide education, explanation, and written materials on patient's individual exercise prescription       Expected Outcomes Short Term: Able to explain program exercise prescription;Long Term: Able to explain home exercise prescription to exercise independently                Copy of goals given to participant.

## 2021-08-08 NOTE — Progress Notes (Signed)
Cardiac Individual Treatment Plan  Patient Details  Name: Frank Wong MRN: [id number] Date of Birth: 04-24-1956 Referring Provider:   Flowsheet Row Cardiac Rehab from 08/08/2021 in [facility] Cardiac and Pulmonary Rehab  Referring Provider Nehemiah Massed       Initial Encounter Date:  Flowsheet Row Cardiac Rehab from 08/08/2021 in [facility] Cardiac and Pulmonary Rehab  Date 08/08/21       Visit Diagnosis: S/P CABG x 2  Patient's Home Medications on Admission:  Current Outpatient Medications:    amLODipine (NORVASC) 2.5 MG tablet, Take 2.5 mg by mouth daily. (Patient not taking: Reported on 07/22/2021), Disp: , Rfl:    [START ON 08/20/2021] amphetamine-dextroamphetamine (ADDERALL XR) 10 MG 24 hr capsule, Take 10 mg by mouth daily., Disp: , Rfl:    aspirin EC 81 MG tablet, Take 81 mg by mouth in the morning., Disp: , Rfl:    atorvastatin (LIPITOR) 80 MG tablet, Take 80 mg by mouth in the morning., Disp: , Rfl:    B Complex-C (B-COMPLEX WITH VITAMIN C) tablet, Take 1 tablet by mouth in the morning., Disp: , Rfl:    benazepril (LOTENSIN) 20 MG tablet, Take 20 mg by mouth in the morning. (Patient not taking: Reported on 07/22/2021), Disp: , Rfl:    carvedilol (COREG) 3.125 MG tablet, Take by mouth., Disp: , Rfl:    clonazePAM (KLONOPIN) 2 MG tablet, Take 1 mg by mouth at bedtime as needed (sleep/ anxiety). (Patient not taking: Reported on 07/22/2021), Disp: , Rfl:    empagliflozin (JARDIANCE) 10 MG TABS tablet, Take 10 mg by mouth daily., Disp: , Rfl:    ferrous sulfate 325 (65 FE) MG tablet, Take 325 mg by mouth in the morning., Disp: , Rfl:    fexofenadine (ALLEGRA) 180 MG tablet, Take 180 mg by mouth in the morning., Disp: , Rfl:    Fluticasone-Umeclidin-Vilant 100-62.5-25 MCG/ACT AEPB, Inhale 1 puff into the lungs daily., Disp: , Rfl:    furosemide (LASIX) 40 MG tablet, Take by mouth., Disp: , Rfl:    gabapentin (NEURONTIN) 600 MG tablet, Take 600 mg by mouth at bedtime., Disp: , Rfl:    glipiZIDE  (GLUCOTROL) 10 MG tablet, Take 10 mg by mouth every morning., Disp: , Rfl:    JARDIANCE 25 MG TABS tablet, Take 25 mg by mouth in the morning., Disp: , Rfl:    levocetirizine (XYZAL) 5 MG tablet, Take 5 mg by mouth every evening., Disp: , Rfl:    Lifitegrast (XIIDRA) 5 % SOLN, Place 1 drop into both eyes daily., Disp: , Rfl:    magnesium oxide (MAG-OX) 400 MG tablet, Take 400 mg by mouth in the morning., Disp: , Rfl:    melatonin 5 MG TABS, Take 5 mg by mouth at bedtime as needed (sleep). (Patient not taking: Reported on 06/27/2021), Disp: , Rfl:    metFORMIN (GLUCOPHAGE) 1000 MG tablet, Take 1,000 mg by mouth 2 (two) times daily with a meal. , Disp: , Rfl:    montelukast (SINGULAIR) 10 MG tablet, Take 10 mg by mouth at bedtime., Disp: , Rfl:    Multiple Vitamin (MULTIVITAMIN WITH MINERALS) TABS tablet, Take 1 tablet by mouth in the morning., Disp: , Rfl:    pantoprazole (PROTONIX) 40 MG tablet, Take 40 mg by mouth at bedtime., Disp: , Rfl:    pimecrolimus (ELIDEL) 1 % cream, Apply topically 2 (two) times daily. Use for 2 weeks at affected areas of eyelids. Can also use at scrotum once daily., Disp: 60 g, Rfl: 11  potassium chloride SA (KLOR-CON M) 20 MEQ tablet, Take by mouth., Disp: , Rfl:    sertraline (ZOLOFT) 25 MG tablet, Take 25 mg by mouth daily., Disp: , Rfl:    Testosterone 20.25 MG/ACT (1.62%) GEL, Apply 2 Pump topically in the morning. Applied to shoulders., Disp: , Rfl:    triamcinolone cream (KENALOG) 0.1 %, Apply 1 application topically 2 (two) times daily as needed (skin irritation/itching). (Patient not taking: Reported on 06/27/2021), Disp: , Rfl:   Past Medical History: Past Medical History:  Diagnosis Date   Alcoholism ([place])    Asthma    CAD (coronary artery disease)    Cardiac arrest ([place])    Chronic cardiac arrhythmia    Chronic kidney disease    COPD (chronic obstructive pulmonary disease) ([place])    Depression    Diabetes mellitus without complication (HCC)     Essential tremor    GERD (gastroesophageal reflux disease)    Heart attack ([place])    Heart murmur    Hyperlipidemia    Hypertension    IBS (irritable bowel syndrome)    Shoulder problem 2010   right   Sinoatrial node dysfunction (HCC)    Sleep apnea     Tobacco Use: Social History   Tobacco Use  Smoking Status Former  Smokeless Tobacco Former    Labs: Recent Review Flowsheet Data   There is no flowsheet data to display.      Exercise Target Goals: Exercise Program Goal: Individual exercise prescription set using results from initial 6 min walk test and THRR while considering  patients activity barriers and safety.   Exercise Prescription Goal: Initial exercise prescription builds to 30-45 minutes a day of aerobic activity, 2-3 days per week.  Home exercise guidelines will be given to patient during program as part of exercise prescription that the participant will acknowledge.   Education: Aerobic Exercise: - Group verbal and visual presentation on the components of exercise prescription. Introduces F.I.T.T principle from ACSM for exercise prescriptions.  Reviews F.I.T.T. principles of aerobic exercise including progression. Written material given at graduation. Flowsheet Row Cardiac Rehab from 12/29/2020 in [facility] Cardiac and Pulmonary Rehab  Education need identified 11/01/20  Date 11/17/20  Educator [facility]  Instruction Review Code 1- Verbalizes Understanding       Education: Resistance Exercise: - Group verbal and visual presentation on the components of exercise prescription. Introduces F.I.T.T principle from ACSM for exercise prescriptions  Reviews F.I.T.T. principles of resistance exercise including progression. Written material given at graduation. Flowsheet Row Cardiac Rehab from 12/29/2020 in [facility] Cardiac and Pulmonary Rehab  Date 11/24/20  Educator [facility]  Instruction Review Code 1- Verbalizes Understanding        Education: Exercise & Equipment Safety: -  Individual verbal instruction and demonstration of equipment use and safety with use of the equipment. Flowsheet Row Cardiac Rehab from 08/08/2021 in [facility] Cardiac and Pulmonary Rehab  Date 08/08/21  Educator [facility]  Instruction Review Code 1- Verbalizes Understanding       Education: Exercise Physiology & General Exercise Guidelines: - Group verbal and written instruction with models to review the exercise physiology of the cardiovascular system and associated critical values. Provides general exercise guidelines with specific guidelines to those with heart or lung disease.  Flowsheet Row Cardiac Rehab from 12/29/2020 in [facility] Cardiac and Pulmonary Rehab  Date 11/10/20  Educator [facility]  Instruction Review Code 1- Verbalizes Understanding       Education: Flexibility, Balance, Mind/Body Relaxation: - Group verbal and visual presentation with  interactive activity on the components of exercise prescription. Introduces F.I.T.T principle from ACSM for exercise prescriptions. Reviews F.I.T.T. principles of flexibility and balance exercise training including progression. Also discusses the mind body connection.  Reviews various relaxation techniques to help reduce and manage stress (i.e. Deep breathing, progressive muscle relaxation, and visualization). Balance handout provided to take home. Written material given at graduation. Flowsheet Row Cardiac Rehab from 12/29/2020 in [facility] Cardiac and Pulmonary Rehab  Date 12/01/20  Educator [facility]  Instruction Review Code 1- Verbalizes Understanding       Activity Barriers & Risk Stratification:  Activity Barriers & Cardiac Risk Stratification - 07/22/21 1302       Activity Barriers & Cardiac Risk Stratification   Activity Barriers Shortness of Breath;Joint Problems    Cardiac Risk Stratification High             6 Minute Walk:  6 Minute Walk     Row Name 08/08/21 1557         6 Minute Walk   Phase Initial     Distance 1420 feet     Walk Time 6  minutes     # of Rest Breaks 0     MPH 2.69     METS 3.46     RPE 9     Perceived Dyspnea  2     VO2 Peak 12.11     Symptoms No     Resting HR 67 bpm     Resting BP 92/60     Resting Oxygen Saturation  98 %     Exercise Oxygen Saturation  during 6 min walk 95 %     Max Ex. HR 97 bpm     Max Ex. BP 112/62     2 Minute Post BP 104/60              Oxygen Initial Assessment:   Oxygen Re-Evaluation:   Oxygen Discharge (Final Oxygen Re-Evaluation):   Initial Exercise Prescription:  Initial Exercise Prescription - 08/08/21 1500       Date of Initial Exercise RX and Referring Provider   Date 08/08/21    Referring Provider Nehemiah Massed      Oxygen   Maintain Oxygen Saturation 88% or higher      Treadmill   MPH 3    Grade 0.5    Minutes 15    METs 3.5      Recumbant Bike   Level 1    RPM 60    Minutes 15    METs 3.46      NuStep   Level 2    SPM 80    Minutes 15    METs 3.46      REL-XR   Level 2    Speed 50    Minutes 15    METs 3.46      Biostep-RELP   Level 1    SPM 50    Minutes 15    METs 3.46      Track   Laps 37    Minutes 15    METs 3.01      Prescription Details   Frequency (times per week) 3    Duration Progress to 30 minutes of continuous aerobic without signs/symptoms of physical distress      Intensity   THRR 40-80% of Max Heartrate 103-139    Ratings of Perceived Exertion 11-13    Perceived Dyspnea 0-4      Progression   Progression Continue to progress  workloads to maintain intensity without signs/symptoms of physical distress.      Resistance Training   Training Prescription Yes    Weight 3    Reps 10-15             Perform Capillary Blood Glucose checks as needed.  Exercise Prescription Changes:   Exercise Prescription Changes     Row Name 08/08/21 1600             Response to Exercise   Blood Pressure (Admit) 92/60       Blood Pressure (Exercise) 112/62       Blood Pressure (Exit) 104/60        Heart Rate (Admit) 67 bpm       Heart Rate (Exercise) 97 bpm       Heart Rate (Exit) 90 bpm       Oxygen Saturation (Admit) 98 %       Oxygen Saturation (Exercise) 95 %       Oxygen Saturation (Exit) 97 %       Rating of Perceived Exertion (Exercise) 9       Perceived Dyspnea (Exercise) 2       Symptoms none       Comments 6 min walk test results         Resistance Training   Training Prescription Yes       Weight 3       Reps 10-15         Treadmill   MPH 3       Grade 0.5       Minutes 15       METs 3.5         Recumbant Bike   Level 1       RPM 60       Minutes 15       METs 3.46         NuStep   Level 2       SPM 80       Minutes 15       METs 3.46         REL-XR   Level 2       Speed 50       Minutes 15       METs 3.46         Biostep-RELP   Level 1       SPM 50       Minutes 15       METs 3.46         Track   Laps 37       Minutes 15       METs 3.01                Exercise Comments:   Exercise Goals and Review:   Exercise Goals     Row Name 08/08/21 1606             Exercise Goals   Increase Physical Activity Yes       Intervention Provide advice, education, support and counseling about physical activity/exercise needs.;Develop an individualized exercise prescription for aerobic and resistive training based on initial evaluation findings, risk stratification, comorbidities and participant's personal goals.       Expected Outcomes Long Term: Add in home exercise to make exercise part of routine and to increase amount of physical activity.;Short Term: Attend rehab on a regular basis to increase amount of physical activity.;Long Term: Exercising regularly at  least 3-5 days a week.       Able to understand and use rate of perceived exertion (RPE) scale Yes       Intervention Provide education and explanation on how to use RPE scale       Expected Outcomes Long Term:  Able to use RPE to guide intensity level when exercising  independently;Short Term: Able to use RPE daily in rehab to express subjective intensity level       Able to understand and use Dyspnea scale Yes       Expected Outcomes Short Term: Able to use Dyspnea scale daily in rehab to express subjective sense of shortness of breath during exertion;Long Term: Able to use Dyspnea scale to guide intensity level when exercising independently       Knowledge and understanding of Target Heart Rate Range (THRR) Yes       Intervention Provide education and explanation of THRR including how the numbers were predicted and where they are located for reference       Expected Outcomes Short Term: Able to state/look up THRR;Long Term: Able to use THRR to govern intensity when exercising independently;Short Term: Able to use daily as guideline for intensity in rehab       Able to check pulse independently Yes       Intervention Provide education and demonstration on how to check pulse in carotid and radial arteries.;Review the importance of being able to check your own pulse for safety during independent exercise       Expected Outcomes Short Term: Able to explain why pulse checking is important during independent exercise;Long Term: Able to check pulse independently and accurately       Understanding of Exercise Prescription Yes       Intervention Provide education, explanation, and written materials on patient's individual exercise prescription       Expected Outcomes Short Term: Able to explain program exercise prescription;Long Term: Able to explain home exercise prescription to exercise independently                Exercise Goals Re-Evaluation :   Discharge Exercise Prescription (Final Exercise Prescription Changes):  Exercise Prescription Changes - 08/08/21 1600       Response to Exercise   Blood Pressure (Admit) 92/60    Blood Pressure (Exercise) 112/62    Blood Pressure (Exit) 104/60    Heart Rate (Admit) 67 bpm    Heart Rate (Exercise) 97 bpm     Heart Rate (Exit) 90 bpm    Oxygen Saturation (Admit) 98 %    Oxygen Saturation (Exercise) 95 %    Oxygen Saturation (Exit) 97 %    Rating of Perceived Exertion (Exercise) 9    Perceived Dyspnea (Exercise) 2    Symptoms none    Comments 6 min walk test results      Resistance Training   Training Prescription Yes    Weight 3    Reps 10-15      Treadmill   MPH 3    Grade 0.5    Minutes 15    METs 3.5      Recumbant Bike   Level 1    RPM 60    Minutes 15    METs 3.46      NuStep   Level 2    SPM 80    Minutes 15    METs 3.46      REL-XR   Level 2    Speed 50  Minutes 15    METs 3.46      Biostep-RELP   Level 1    SPM 50    Minutes 15    METs 3.46      Track   Laps 37    Minutes 15    METs 3.01             Nutrition:  Target Goals: Understanding of nutrition guidelines, daily intake of sodium '1500mg'$ , cholesterol '200mg'$ , calories 30% from fat and 7% or less from saturated fats, daily to have 5 or more servings of fruits and vegetables.  Education: All About Nutrition: -Group instruction provided by verbal, written material, interactive activities, discussions, models, and posters to present general guidelines for heart healthy nutrition including fat, fiber, MyPlate, the role of sodium in heart healthy nutrition, utilization of the nutrition label, and utilization of this knowledge for meal planning. Follow up email sent as well. Written material given at graduation. Flowsheet Row Cardiac Rehab from 08/08/2021 in [facility] Cardiac and Pulmonary Rehab  Education need identified 08/08/21       Biometrics:  Pre Biometrics - 08/08/21 1607       Pre Biometrics   Height 5' 8.5" (1.74 m)    Weight 171 lb 1.6 oz (77.6 kg)    BMI (Calculated) 25.63    Single Leg Stand 29.91 seconds              Nutrition Therapy Plan and Nutrition Goals:  Nutrition Therapy & Goals - 08/08/21 1611       Intervention Plan   Intervention Prescribe, educate and  counsel regarding individualized specific dietary modifications aiming towards targeted core components such as weight, hypertension, lipid management, diabetes, heart failure and other comorbidities.    Expected Outcomes Short Term Goal: Understand basic principles of dietary content, such as calories, fat, sodium, cholesterol and nutrients.;Short Term Goal: A plan has been developed with personal nutrition goals set during dietitian appointment.;Long Term Goal: Adherence to prescribed nutrition plan.             Nutrition Assessments:  MEDIFICTS Score Key: ?70 Need to make dietary changes  40-70 Heart Healthy Diet ? 40 Therapeutic Level Cholesterol Diet  Flowsheet Row Cardiac Rehab from 08/08/2021 in [facility] Cardiac and Pulmonary Rehab  Picture Your Plate Total Score on Admission 69      Picture Your Plate Scores: <74 Unhealthy dietary pattern with much room for improvement. 41-50 Dietary pattern unlikely to meet recommendations for good health and room for improvement. 51-60 More healthful dietary pattern, with some room for improvement.  >60 Healthy dietary pattern, although there may be some specific behaviors that could be improved.    Nutrition Goals Re-Evaluation:   Nutrition Goals Discharge (Final Nutrition Goals Re-Evaluation):   Psychosocial: Target Goals: Acknowledge presence or absence of significant depression and/or stress, maximize coping skills, provide positive support system. Participant is able to verbalize types and ability to use techniques and skills needed for reducing stress and depression.   Education: Stress, Anxiety, and Depression - Group verbal and visual presentation to define topics covered.  Reviews how body is impacted by stress, anxiety, and depression.  Also discusses healthy ways to reduce stress and to treat/manage anxiety and depression.  Written material given at graduation.   Education: Sleep Hygiene -Provides group verbal and written  instruction about how sleep can affect your health.  Define sleep hygiene, discuss sleep cycles and impact of sleep habits. Review good sleep hygiene tips.    Initial Review &  Psychosocial Screening:  Initial Psych Review & Screening - 07/22/21 1308       Initial Review   Current issues with History of Depression;Current Psychotropic Meds;Current Depression    Source of Stress Concerns Chronic Illness;Unable to participate in former interests or hobbies;Unable to perform yard/household activities      [place]? Yes   wife and children     Barriers   Psychosocial barriers to participate in program The patient should benefit from training in stress management and relaxation.;Psychosocial barriers identified (see note);There are no identifiable barriers or psychosocial needs.      Screening Interventions   Interventions To provide support and resources with identified psychosocial needs;Provide feedback about the scores to participant;Encouraged to exercise    Expected Outcomes Short Term goal: Utilizing psychosocial counselor, staff and physician to assist with identification of specific Stressors or current issues interfering with healing process. Setting desired goal for each stressor or current issue identified.;Long Term Goal: Stressors or current issues are controlled or eliminated.;Short Term goal: Identification and review with participant of any Quality of Life or Depression concerns found by scoring the questionnaire.;Long Term goal: The participant improves quality of Life and PHQ9 Scores as seen by post scores and/or verbalization of changes             Quality of Life Scores:   Quality of Life - 08/08/21 1609       Quality of Life   Select Quality of Life      Quality of Life Scores   Health/Function Pre 23.03 %    Health/Function Post 25.31 %    Health/Function % Change 9.9 %    Socioeconomic Pre 25.31 %    Psych/Spiritual Pre 27.07 %     Family Pre 30 %    GLOBAL Pre 25.36 %            Scores of 19 and below usually indicate a poorer quality of life in these areas.  A difference of  2-3 points is a clinically meaningful difference.  A difference of 2-3 points in the total score of the Quality of Life Index has been associated with significant improvement in overall quality of life, self-image, physical symptoms, and general health in studies assessing change in quality of life.  PHQ-9: Recent Review Flowsheet Data     Depression screen [facility] 2/9 08/08/2021 01/05/2021 11/01/2020   Decreased Interest 0 0 0   Down, Depressed, Hopeless 0 1 0   PHQ - 2 Score 0 1 0   Altered sleeping 0 1 1   Tired, decreased energy '1 1 1   '$ Change in appetite 1 0 0   Feeling bad or failure about yourself  0 0 0   Trouble concentrating '2 1 1   '$ Moving slowly or fidgety/restless 0 0 0   Suicidal thoughts 0 0 0   PHQ-9 Score '4 4 3   '$ Difficult doing work/chores Somewhat difficult Not difficult at all Somewhat difficult      Interpretation of Total Score  Total Score Depression Severity:  1-4 = Minimal depression, 5-9 = Mild depression, 10-14 = Moderate depression, 15-19 = Moderately severe depression, 20-27 = Severe depression   Psychosocial Evaluation and Intervention:  Psychosocial Evaluation - 07/22/21 1313       Psychosocial Evaluation & Interventions   Interventions Encouraged to exercise with the program and follow exercise prescription;Stress management education    Comments Midas is returning to cardiac rehab after his CABG  x 2. He states recovery is going as expected and he is ready to get back to his usual activities. He has been diagnosed with emphysema since his last admission in cardiac rehab in mid 2022. He reports his breathing has been troublesome and he wants to work on that as well as gain back some muscle he has lost during recovery. His history of depression is well managed and he continues to have a great support system. He  was happy to announce their first granddaughter was born recently and they are planning on traveling to meet her soon.    Expected Outcomes Short: attend cardiac rehab for education and exercise. Long: develop and maintain positive self care habits.    Continue Psychosocial Services  Follow up required by staff             Psychosocial Re-Evaluation:   Psychosocial Discharge (Final Psychosocial Re-Evaluation):   Vocational Rehabilitation: Provide vocational rehab assistance to qualifying candidates.   Vocational Rehab Evaluation & Intervention:  Vocational Rehab - 07/22/21 1325       Initial Vocational Rehab Evaluation & Intervention   Assessment shows need for Vocational Rehabilitation No             Education: Education Goals: Education classes will be provided on a variety of topics geared toward better understanding of heart health and risk factor modification. Participant will state understanding/return demonstration of topics presented as noted by education test scores.  Learning Barriers/Preferences:  Learning Barriers/Preferences - 07/22/21 1325       Learning Barriers/Preferences   Learning Barriers Sight   glasses   Learning Preferences None             General Cardiac Education Topics:  AED/CPR: - Group verbal and written instruction with the use of models to demonstrate the basic use of the AED with the basic ABC's of resuscitation.   Anatomy and Cardiac Procedures: - Group verbal and visual presentation and models provide information about basic cardiac anatomy and function. Reviews the testing methods done to diagnose heart disease and the outcomes of the test results. Describes the treatment choices: Medical Management, Angioplasty, or Coronary Bypass Surgery for treating various heart conditions including Myocardial Infarction, Angina, Valve Disease, and Cardiac Arrhythmias.  Written material given at graduation. Flowsheet Row Cardiac Rehab  from 08/08/2021 in [facility] Cardiac and Pulmonary Rehab  Education need identified 08/08/21       Medication Safety: - Group verbal and visual instruction to review commonly prescribed medications for heart and lung disease. Reviews the medication, class of the drug, and side effects. Includes the steps to properly store meds and maintain the prescription regimen.  Written material given at graduation. Flowsheet Row Cardiac Rehab from 08/08/2021 in [facility] Cardiac and Pulmonary Rehab  Education need identified 08/08/21       Intimacy: - Group verbal instruction through game format to discuss how heart and lung disease can affect sexual intimacy. Written material given at graduation.. Flowsheet Row Cardiac Rehab from 12/29/2020 in [facility] Cardiac and Pulmonary Rehab  Date 11/17/20  Educator [facility]  Instruction Review Code 1- Verbalizes Understanding       Know Your Numbers and Heart Failure: - Group verbal and visual instruction to discuss disease risk factors for cardiac and pulmonary disease and treatment options.  Reviews associated critical values for Overweight/Obesity, Hypertension, Cholesterol, and Diabetes.  Discusses basics of heart failure: signs/symptoms and treatments.  Introduces Heart Failure Zone chart for action plan for heart failure.  Written material given  at graduation. Flowsheet Row Cardiac Rehab from 12/29/2020 in [facility] Cardiac and Pulmonary Rehab  Date 12/22/20  Educator SB  Instruction Review Code 1- Verbalizes Understanding       Infection Prevention: - Provides verbal and written material to individual with discussion of infection control including proper hand washing and proper equipment cleaning during exercise session. Flowsheet Row Cardiac Rehab from 08/08/2021 in [facility] Cardiac and Pulmonary Rehab  Date 08/08/21  Educator [facility]  Instruction Review Code 1- Verbalizes Understanding       Falls Prevention: - Provides verbal and written material to individual with  discussion of falls prevention and safety. Flowsheet Row Cardiac Rehab from 08/08/2021 in [facility] Cardiac and Pulmonary Rehab  Date 08/08/21  Educator [facility]  Instruction Review Code 1- Verbalizes Understanding       Other: -Provides group and verbal instruction on various topics (see comments)   Knowledge Questionnaire Score:  Knowledge Questionnaire Score - 08/08/21 1614       Knowledge Questionnaire Score   Pre Score 23/26             Core Components/Risk Factors/Patient Goals at Admission:  Personal Goals and Risk Factors at Admission - 08/08/21 1611       Core Components/Risk Factors/Patient Goals on Admission    Weight Management Yes;Weight Loss    Intervention Weight Management: Develop a combined nutrition and exercise program designed to reach desired caloric intake, while maintaining appropriate intake of nutrient and fiber, sodium and fats, and appropriate energy expenditure required for the weight goal.;Weight Management: Provide education and appropriate resources to help participant work on and attain dietary goals.;Weight Management/Obesity: Establish reasonable short term and long term weight goals.    Admit Weight 171 lb 1.6 oz (77.6 kg)    Goal Weight: Short Term 168 lb (76.2 kg)    Goal Weight: Long Term 165 lb (74.8 kg)    Expected Outcomes Short Term: Continue to assess and modify interventions until short term weight is achieved;Long Term: Adherence to nutrition and physical activity/exercise program aimed toward attainment of established weight goal;Weight Loss: Understanding of general recommendations for a balanced deficit meal plan, which promotes 1-2 lb weight loss per week and includes a negative energy balance of [id number] kcal/d;Understanding recommendations for meals to include 15-35% energy as protein, 25-35% energy from fat, 35-60% energy from carbohydrates, less than '200mg'$  of dietary cholesterol, 20-35 gm of total fiber daily;Understanding of distribution  of calorie intake throughout the day with the consumption of 4-5 meals/snacks    Diabetes Yes    Intervention Provide education about signs/symptoms and action to take for hypo/hyperglycemia.;Provide education about proper nutrition, including hydration, and aerobic/resistive exercise prescription along with prescribed medications to achieve blood glucose in normal ranges: Fasting glucose 65-99 mg/dL    Expected Outcomes Short Term: Participant verbalizes understanding of the signs/symptoms and immediate care of hyper/hypoglycemia, proper foot care and importance of medication, aerobic/resistive exercise and nutrition plan for blood glucose control.;Long Term: Attainment of HbA1C < 7%.    Hypertension Yes    Intervention Provide education on lifestyle modifcations including regular physical activity/exercise, weight management, moderate sodium restriction and increased consumption of fresh fruit, vegetables, and low fat dairy, alcohol moderation, and smoking cessation.;Monitor prescription use compliance.    Expected Outcomes Short Term: Continued assessment and intervention until BP is < 140/21m HG in hypertensive participants. < 130/843mHG in hypertensive participants with diabetes, heart failure or chronic kidney disease.;Long Term: Maintenance of blood pressure at goal levels.    Lipids Yes  Intervention Provide education and support for participant on nutrition & aerobic/resistive exercise along with prescribed medications to achieve LDL '70mg'$ , HDL >'40mg'$ .    Expected Outcomes Short Term: Participant states understanding of desired cholesterol values and is compliant with medications prescribed. Participant is following exercise prescription and nutrition guidelines.;Long Term: Cholesterol controlled with medications as prescribed, with individualized exercise RX and with personalized nutrition plan. Value goals: LDL < '70mg'$ , HDL > 40 mg.             Education:Diabetes - Individual verbal  and written instruction to review signs/symptoms of diabetes, desired ranges of glucose level fasting, after meals and with exercise. Acknowledge that pre and post exercise glucose checks will be done for 3 sessions at entry of program. [place] from 08/08/2021 in [facility] Cardiac and Pulmonary Rehab  Date 08/08/21  Educator [facility]  Instruction Review Code 1- Verbalizes Understanding       Core Components/Risk Factors/Patient Goals Review:    Core Components/Risk Factors/Patient Goals at Discharge (Final Review):    ITP Comments:  ITP Comments     [place] Name 07/22/21 1304 08/08/21 1554         ITP Comments Initial telephone orientation completed. Diagnosis can be found in [facility] 2/9. EP orientation scheduled for Monday 3/13 1:30pm. Completed 6MWT and gym orientation. Initial ITP created and sent for review to Dr. Emily Filbert, Medical Director.               Comments: initial ITP

## 2021-08-10 ENCOUNTER — Other Ambulatory Visit: Payer: Self-pay

## 2021-08-10 DIAGNOSIS — Z951 Presence of aortocoronary bypass graft: Secondary | ICD-10-CM | POA: Diagnosis not present

## 2021-08-10 LAB — GLUCOSE, CAPILLARY
Glucose-Capillary: 110 mg/dL — ABNORMAL HIGH (ref 70–99)
Glucose-Capillary: 96 mg/dL (ref 70–99)

## 2021-08-10 NOTE — Progress Notes (Signed)
Daily Session Note ? ?Patient Details  ?Name: Frank Wong ?MRN: [id number] ?Date of Birth: Jan 14, 1956 ?Referring Provider:   ?Flowsheet Row Cardiac Rehab from 08/08/2021 in [facility] Cardiac and Pulmonary Rehab  ?Referring Provider Nehemiah Massed  ? ?  ? ? ?Encounter Date: 08/10/2021 ? ?Check In: ? Session Check In - 08/10/21 0732   ? ?  ? Check-In  ? Supervising physician immediately available to respond to emergencies See telemetry face sheet for immediately available ER MD   ? Location [facility]-Cardiac & Pulmonary Rehab   ? Staff Present Birdie Sons, MPA, RN;Jessica Luan Pulling, MA, RCEP, CCRP, CCET;Joseph [place], [place]   ? Virtual Visit No   ? Medication changes reported     No   ? Fall or balance concerns reported    No   ? Warm-up and Cool-down Performed on first and last piece of equipment   ? Resistance Training Performed Yes   ? VAD Patient? No   ? PAD/SET Patient? No   ?  ? Pain Assessment  ? Currently in Pain? No/denies   ? ?  ?  ? ?  ? ? ? ? ? ?Social History  ? ?Tobacco Use  ?Smoking Status Former  ?Smokeless Tobacco Former  ? ? ?Goals Met:  ?Independence with exercise equipment ?Exercise tolerated well ?No report of concerns or symptoms today ?Strength training completed today ? ?Goals Unmet:  ?Not Applicable ? ?Comments: First full day of exercise!  Patient was oriented to gym and equipment including functions, settings, policies, and procedures.  Patient's individual exercise prescription and treatment plan were reviewed.  All starting workloads were established based on the results of the 6 minute walk test done at initial orientation visit.  The plan for exercise progression was also introduced and progression will be customized based on patient's performance and goals. ? ? ? ?Dr. Emily Filbert is Medical Director for [place].  ?Dr. Ottie Glazier is Medical Director for [facility] Pulmonary Rehabilitation. ?

## 2021-08-12 ENCOUNTER — Encounter: Payer: 59 | Admitting: *Deleted

## 2021-08-12 ENCOUNTER — Other Ambulatory Visit: Payer: Self-pay

## 2021-08-12 DIAGNOSIS — Z951 Presence of aortocoronary bypass graft: Secondary | ICD-10-CM | POA: Diagnosis not present

## 2021-08-12 LAB — GLUCOSE, CAPILLARY
Glucose-Capillary: 136 mg/dL — ABNORMAL HIGH (ref 70–99)
Glucose-Capillary: 89 mg/dL (ref 70–99)

## 2021-08-12 NOTE — Progress Notes (Signed)
Daily Session Note ? ?Patient Details  ?Name: Frank Wong ?MRN: [id number] ?Date of Birth: 1955/07/08 ?Referring Provider:   ?Flowsheet Row Cardiac Rehab from 08/08/2021 in [facility] Cardiac and Pulmonary Rehab  ?Referring Provider Nehemiah Massed  ? ?  ? ? ?Encounter Date: 08/12/2021 ? ?Check In: ? Session Check In - 08/12/21 0809   ? ?  ? Check-In  ? Supervising physician immediately available to respond to emergencies See telemetry face sheet for immediately available ER MD   ? Location [facility]-Cardiac & Pulmonary Rehab   ? Staff Present Heath Lark, RN, BSN, CCRP;Joseph [place], [place], [place], [place], [place], CCET   ? Virtual Visit No   ? Medication changes reported     No   ? Fall or balance concerns reported    No   ? Warm-up and Cool-down Performed on first and last piece of equipment   ? Resistance Training Performed Yes   ? VAD Patient? No   ? PAD/SET Patient? No   ?  ? Pain Assessment  ? Currently in Pain? No/denies   ? ?  ?  ? ?  ? ? ? ? ? ?Social History  ? ?Tobacco Use  ?Smoking Status Former  ?Smokeless Tobacco Former  ? ? ?Goals Met:  ?Independence with exercise equipment ?Exercise tolerated well ?No report of concerns or symptoms today ? ?Goals Unmet:  ?Not Applicable ? ?Comments: Pt able to follow exercise prescription today without complaint.  Will continue to monitor for progression. ? ? ? ?Dr. Emily Filbert is Medical Director for [place].  ?Dr. Ottie Glazier is Medical Director for [facility] Pulmonary Rehabilitation. ?

## 2021-08-15 ENCOUNTER — Encounter: Payer: 59 | Admitting: *Deleted

## 2021-08-15 ENCOUNTER — Other Ambulatory Visit: Payer: Self-pay

## 2021-08-15 DIAGNOSIS — Z951 Presence of aortocoronary bypass graft: Secondary | ICD-10-CM | POA: Diagnosis not present

## 2021-08-15 LAB — GLUCOSE, CAPILLARY
Glucose-Capillary: 88 mg/dL (ref 70–99)
Glucose-Capillary: 114 mg/dL — ABNORMAL HIGH (ref 70–99)

## 2021-08-15 NOTE — Progress Notes (Signed)
Daily Session Note ? ?Patient Details  ?Name: Frank Wong ?MRN: [id number] ?Date of Birth: [birth year] ?Referring Provider:   ?Flowsheet Row Cardiac Rehab from 08/08/2021 in [facility] Cardiac and Pulmonary Rehab  ?Referring Provider Nehemiah Massed  ? ?  ? ? ?Encounter Date: 08/15/2021 ? ?Check In: ? Session Check In - 08/15/21 1041   ? ?  ? Check-In  ? Supervising physician immediately available to respond to emergencies See telemetry face sheet for immediately available ER MD   ? Location [facility]-Cardiac & Pulmonary Rehab   ? Staff Present Renita Papa, RN Moises Blood, BS, ACSM CEP, Exercise Physiologist;Jessica Luan Pulling, MA, RCEP, CCRP, CCET   ? Virtual Visit No   ? Medication changes reported     No   ? Fall or balance concerns reported    No   ? Warm-up and Cool-down Performed on first and last piece of equipment   ? Resistance Training Performed Yes   ? VAD Patient? No   ? PAD/SET Patient? No   ?  ? Pain Assessment  ? Currently in Pain? No/denies   ? ?  ?  ? ?  ? ? ? ? ? ?Social History  ? ?Tobacco Use  ?Smoking Status Former  ?Smokeless Tobacco Former  ? ? ?Goals Met:  ?Independence with exercise equipment ?Exercise tolerated well ?No report of concerns or symptoms today ?Strength training completed today ? ?Goals Unmet:  ?Not Applicable ? ?Comments: Pt able to follow exercise prescription today without complaint.  Will continue to monitor for progression. ? ? ? ?Dr. Emily Filbert is Medical Director for [place].  ?Dr. Ottie Glazier is Medical Director for [facility] Pulmonary Rehabilitation. ?

## 2021-08-17 ENCOUNTER — Other Ambulatory Visit: Payer: Self-pay

## 2021-08-17 ENCOUNTER — Encounter: Payer: Self-pay | Admitting: *Deleted

## 2021-08-17 DIAGNOSIS — Z951 Presence of aortocoronary bypass graft: Secondary | ICD-10-CM

## 2021-08-17 NOTE — Progress Notes (Signed)
Cardiac Individual Treatment Plan ? ?Patient Details  ?Name: Frank Wong ?MRN: [id number] ?Date of Birth: 02/26/1956 ?Referring Provider:   ?Flowsheet Row Cardiac Rehab from 08/08/2021 in [facility] Cardiac and Pulmonary Rehab  ?Referring Provider Nehemiah Massed  ? ?  ? ? ?Initial Encounter Date:  ?Flowsheet Row Cardiac Rehab from 08/08/2021 in [facility] Cardiac and Pulmonary Rehab  ?Date 08/08/21  ? ?  ? ? ?Visit Diagnosis: S/P CABG x 2 ? ?Patient's Home Medications on Admission: ? ?Current Outpatient Medications:  ?  amLODipine (NORVASC) 2.5 MG tablet, Take 2.5 mg by mouth daily. (Patient not taking: Reported on 07/22/2021), Disp: , Rfl:  ?  [START ON 08/20/2021] amphetamine-dextroamphetamine (ADDERALL XR) 10 MG 24 hr capsule, Take 10 mg by mouth daily., Disp: , Rfl:  ?  aspirin EC 81 MG tablet, Take 81 mg by mouth in the morning., Disp: , Rfl:  ?  atorvastatin (LIPITOR) 80 MG tablet, Take 80 mg by mouth in the morning., Disp: , Rfl:  ?  B Complex-C (B-COMPLEX WITH VITAMIN C) tablet, Take 1 tablet by mouth in the morning., Disp: , Rfl:  ?  benazepril (LOTENSIN) 20 MG tablet, Take 20 mg by mouth in the morning. (Patient not taking: Reported on 07/22/2021), Disp: , Rfl:  ?  clonazePAM (KLONOPIN) 2 MG tablet, Take 1 mg by mouth at bedtime as needed (sleep/ anxiety). (Patient not taking: Reported on 07/22/2021), Disp: , Rfl:  ?  empagliflozin (JARDIANCE) 10 MG TABS tablet, Take 10 mg by mouth daily., Disp: , Rfl:  ?  ferrous sulfate 325 (65 FE) MG tablet, Take 325 mg by mouth in the morning., Disp: , Rfl:  ?  fexofenadine (ALLEGRA) 180 MG tablet, Take 180 mg by mouth in the morning., Disp: , Rfl:  ?  Fluticasone-Umeclidin-Vilant 100-62.5-25 MCG/ACT AEPB, Inhale 1 puff into the lungs daily., Disp: , Rfl:  ?  gabapentin (NEURONTIN) 600 MG tablet, Take 600 mg by mouth at bedtime., Disp: , Rfl:  ?  glipiZIDE (GLUCOTROL) 10 MG tablet, Take 10 mg by mouth every morning., Disp: , Rfl:  ?  JARDIANCE 25 MG TABS tablet, Take 25 mg by mouth in the  morning., Disp: , Rfl:  ?  levocetirizine (XYZAL) 5 MG tablet, Take 5 mg by mouth every evening., Disp: , Rfl:  ?  Lifitegrast (XIIDRA) 5 % SOLN, Place 1 drop into both eyes daily., Disp: , Rfl:  ?  magnesium oxide (MAG-OX) 400 MG tablet, Take 400 mg by mouth in the morning., Disp: , Rfl:  ?  melatonin 5 MG TABS, Take 5 mg by mouth at bedtime as needed (sleep). (Patient not taking: Reported on 06/27/2021), Disp: , Rfl:  ?  metFORMIN (GLUCOPHAGE) 1000 MG tablet, Take 1,000 mg by mouth 2 (two) times daily with a meal. , Disp: , Rfl:  ?  montelukast (SINGULAIR) 10 MG tablet, Take 10 mg by mouth at bedtime., Disp: , Rfl:  ?  Multiple Vitamin (MULTIVITAMIN WITH MINERALS) TABS tablet, Take 1 tablet by mouth in the morning., Disp: , Rfl:  ?  pantoprazole (PROTONIX) 40 MG tablet, Take 40 mg by mouth at bedtime., Disp: , Rfl:  ?  pimecrolimus (ELIDEL) 1 % cream, Apply topically 2 (two) times daily. Use for 2 weeks at affected areas of eyelids. Can also use at scrotum once daily., Disp: 60 g, Rfl: 11 ?  potassium chloride SA (KLOR-CON M) 20 MEQ tablet, Take by mouth., Disp: , Rfl:  ?  sertraline (ZOLOFT) 25 MG tablet, Take 25 mg by  mouth daily., Disp: , Rfl:  ?  Testosterone 20.25 MG/ACT (1.62%) GEL, Apply 2 Pump topically in the morning. Applied to shoulders., Disp: , Rfl:  ?  triamcinolone cream (KENALOG) 0.1 %, Apply 1 application topically 2 (two) times daily as needed (skin irritation/itching). (Patient not taking: Reported on 06/27/2021), Disp: , Rfl:  ? ?Past Medical History: ?Past Medical History:  ?Diagnosis Date  ? Alcoholism ([place])   ? Asthma   ? CAD (coronary artery disease)   ? Cardiac arrest [facility])   ? Chronic cardiac arrhythmia   ? Chronic kidney disease   ? COPD (chronic obstructive pulmonary disease) ([place])   ? Depression   ? Diabetes mellitus without complication ([place])   ? Essential tremor   ? GERD (gastroesophageal reflux disease)   ? Heart attack ([place])   ? Heart murmur   ? Hyperlipidemia   ? Hypertension   ?  IBS (irritable bowel syndrome)   ? Shoulder problem 2010  ? right  ? Sinoatrial node dysfunction (H[facility])   ? Sleep apnea   ? ? ?Tobacco Use: ?Social History  ? ?Tobacco Use  ?Smoking Status Former  ?Smokeless Tobacco Former  ? ? ?Labs: ?Review Flowsheet   ? ?    ? View : No data to display.  ?  ?  ?  ?  ?  ? ? ? ?Exercise Target Goals: ?Exercise Program Goal: ?Individual exercise prescription set using results from initial 6 min walk test and THRR while considering  patient?s activity barriers and safety.  ? ?Exercise Prescription Goal: ?Initial exercise prescription builds to 30-45 minutes a day of aerobic activity, 2-3 days per week.  Home exercise guidelines will be given to patient during program as part of exercise prescription that the participant will acknowledge. ? ? ?Education: Aerobic Exercise: ?- Group verbal and visual presentation on the components of exercise prescription. Introduces F.I.T.T principle from ACSM for exercise prescriptions.  Reviews F.I.T.T. principles of aerobic exercise including progression. Written material given at graduation. ?Flowsheet Row Cardiac Rehab from 12/29/2020 in [facility] Cardiac and Pulmonary Rehab  ?Education need identified 11/01/20  ?Date 11/17/20  ?Educator [facility]  ?Instruction Review Code 1- Verbalizes Understanding  ? ?  ? ? ?Education: Resistance Exercise: ?- Group verbal and visual presentation on the components of exercise prescription. Introduces F.I.T.T principle from ACSM for exercise prescriptions  Reviews F.I.T.T. principles of resistance exercise including progression. Written material given at graduation. ?Flowsheet Row Cardiac Rehab from 12/29/2020 in [facility] Cardiac and Pulmonary Rehab  ?Date 11/24/20  ?Educator [facility]  ?Instruction Review Code 1- Verbalizes Understanding  ? ?  ? ?  ?Education: Exercise & Equipment Safety: ?- Individual verbal instruction and demonstration of equipment use and safety with use of the equipment. ?Flowsheet Row Cardiac Rehab from 08/08/2021 in  [facility] Cardiac and Pulmonary Rehab  ?Date 08/08/21  ?Educator [place]  ?Instruction Review Code 1- Verbalizes Understanding  ? ?  ? ? ?Education: Exercise Physiology & General Exercise Guidelines: ?- Group verbal and written instruction with models to review the exercise physiology of the cardiovascular system and associated critical values. Provides general exercise guidelines with specific guidelines to those with heart or lung disease.  ?Flowsheet Row Cardiac Rehab from 12/29/2020 in [facility] Cardiac and Pulmonary Rehab  ?Date 11/10/20  ?Educator [facility]  ?Instruction Review Code 1- Verbalizes Understanding  ? ?  ? ? ?Education: Flexibility, Balance, Mind/Body Relaxation: ?- Group verbal and visual presentation with interactive activity on the components of exercise prescription. Introduces F.I.T.T principle from ACSM for  exercise prescriptions. Reviews F.I.T.T. principles of flexibility and balance exercise training including progression. Also discusses the mind body connection.  Reviews various relaxation techniques to help reduce and manage stress (i.e. Deep breathing, progressive muscle relaxation, and visualization). Balance handout provided to take home. Written material given at graduation. ?Flowsheet Row Cardiac Rehab from 12/29/2020 in [facility] Cardiac and Pulmonary Rehab  ?Date 12/01/20  ?Educator [facility]  ?Instruction Review Code 1- Verbalizes Understanding  ? ?  ? ? ?Activity Barriers & Risk Stratification: ? Activity Barriers & Cardiac Risk Stratification - 07/22/21 1302   ? ?  ? Activity Barriers & Cardiac Risk Stratification  ? Activity Barriers Shortness of Breath;Joint Problems   ? Cardiac Risk Stratification High   ? ?  ?  ? ?  ? ? ?6 Minute Walk: ? 6 Minute Walk   ? ? [place] Name 08/08/21 1557  ?  ?  ?  ? 6 Minute Walk  ? Phase Initial    ? Distance 1420 feet    ? Walk Time 6 minutes    ? # of Rest Breaks 0    ? MPH 2.69    ? METS 3.46    ? RPE 9    ? Perceived Dyspnea  2    ? VO2 Peak 12.11    ? Symptoms No    ? Resting HR  67 bpm    ? Resting BP 92/60    ? Resting Oxygen Saturation  98 %    ? Exercise Oxygen Saturation  during 6 min walk 95 %    ? Max Ex. HR 97 bpm    ? Max Ex. BP 112/62    ? 2 Minute Post BP 104/60    ? ?

## 2021-08-17 NOTE — Progress Notes (Signed)
Daily Session Note ? ?Patient Details  ?Name: Frank Wong ?MRN: [id number] ?Date of Birth: [birth year] ?Referring Provider:   ?Flowsheet Row Cardiac Rehab from 08/08/2021 in [facility] Cardiac and Pulmonary Rehab  ?Referring Provider Nehemiah Massed  ? ?  ? ? ?Encounter Date: 08/17/2021 ? ?Check In: ? Session Check In - 08/17/21 0733   ? ?  ? Check-In  ? Supervising physician immediately available to respond to emergencies See telemetry face sheet for immediately available ER MD   ? Location [facility]-Cardiac & Pulmonary Rehab   ? Staff Present Birdie Sons, MPA, RN;Melissa Bowdon, RDN, LDN;Joseph [place], RCP,RRT,BSRT   ? Virtual Visit No   ? Medication changes reported     No   ? Fall or balance concerns reported    No   ? Warm-up and Cool-down Performed on first and last piece of equipment   ? Resistance Training Performed Yes   ? VAD Patient? No   ? PAD/SET Patient? No   ?  ? Pain Assessment  ? Currently in Pain? No/denies   ? ?  ?  ? ?  ? ? ? ? ? ?Social History  ? ?Tobacco Use  ?Smoking Status Former  ?Smokeless Tobacco Former  ? ? ?Goals Met:  ?Independence with exercise equipment ?Exercise tolerated well ?No report of concerns or symptoms today ?Strength training completed today ? ?Goals Unmet:  ?Not Applicable ? ?Comments: Pt able to follow exercise prescription today without complaint.  Will continue to monitor for progression. ? ? ? ?Dr. Emily Filbert is Medical Director for [place].  ?Dr. Ottie Glazier is Medical Director for [facility] Pulmonary Rehabilitation. ?

## 2021-08-22 ENCOUNTER — Encounter: Payer: 59 | Admitting: *Deleted

## 2021-08-22 ENCOUNTER — Other Ambulatory Visit: Payer: Self-pay

## 2021-08-22 DIAGNOSIS — Z955 Presence of coronary angioplasty implant and graft: Secondary | ICD-10-CM

## 2021-08-22 DIAGNOSIS — Z951 Presence of aortocoronary bypass graft: Secondary | ICD-10-CM

## 2021-08-22 NOTE — Progress Notes (Signed)
Daily Session Note ? ?Patient Details  ?Name: Frank Wong ?MRN: [id number] ?Date of Birth: July 15, 1955 ?Referring Provider:   ?Flowsheet Row Cardiac Rehab from 08/08/2021 in [facility] Cardiac and Pulmonary Rehab  ?Referring Provider Nehemiah Massed  ? ?  ? ? ?Encounter Date: 08/22/2021 ? ?Check In: ? Session Check In - 08/22/21 1027   ? ?  ? Check-In  ? Supervising physician immediately available to respond to emergencies See telemetry face sheet for immediately available ER MD   ? Location [facility]-Cardiac & Pulmonary Rehab   ? Staff Present Renita Papa, RN Moises Blood, BS, ACSM CEP, Exercise Physiologist;Amanda Oletta Darter, [place], ACSM CEP, Exercise Physiologist   ? Virtual Visit No   ? Medication changes reported     No   ? Fall or balance concerns reported    No   ? Warm-up and Cool-down Performed on first and last piece of equipment   ? Resistance Training Performed Yes   ? VAD Patient? No   ? PAD/SET Patient? No   ?  ? Pain Assessment  ? Currently in Pain? No/denies   ? ?  ?  ? ?  ? ? ? ? ? ?Social History  ? ?Tobacco Use  ?Smoking Status Former  ?Smokeless Tobacco Former  ? ? ?Goals Met:  ?Independence with exercise equipment ?Exercise tolerated well ?No report of concerns or symptoms today ?Strength training completed today ? ?Goals Unmet:  ?Not Applicable ? ?Comments: Pt able to follow exercise prescription today without complaint.  Will continue to monitor for progression. ? ? ? ?Dr. Emily Filbert is Medical Director for [place].  ?Dr. Ottie Glazier is Medical Director for [facility] Pulmonary Rehabilitation. ?

## 2021-08-24 ENCOUNTER — Other Ambulatory Visit: Payer: Self-pay

## 2021-08-24 DIAGNOSIS — Z951 Presence of aortocoronary bypass graft: Secondary | ICD-10-CM | POA: Diagnosis not present

## 2021-08-24 NOTE — Progress Notes (Signed)
Daily Session Note ? ?Patient Details  ?Name: Frank Wong ?MRN: [id number] ?Date of Birth: [birth year] ?Referring Provider:   ?Flowsheet Row Cardiac Rehab from 08/08/2021 in [facility] Cardiac and Pulmonary Rehab  ?Referring Provider Nehemiah Massed  ? ?  ? ? ?Encounter Date: 08/24/2021 ? ?Check In: ? Session Check In - 08/24/21 0727   ? ?  ? Check-In  ? Supervising physician immediately available to respond to emergencies See telemetry face sheet for immediately available ER MD   ? Location [facility]-Cardiac & Pulmonary Rehab   ? Staff Present Birdie Sons, MPA, RN;Jessica [place], MA, RCEP, CCRP, CCET;Amanda Sommer, BA, ACSM CEP, Exercise Physiologist;Joseph [place], [place]   ? Virtual Visit No   ? Medication changes reported     No   ? Fall or balance concerns reported    No   ? Warm-up and Cool-down Performed on first and last piece of equipment   ? Resistance Training Performed Yes   ? VAD Patient? No   ? PAD/SET Patient? No   ?  ? Pain Assessment  ? Currently in Pain? No/denies   ? ?  ?  ? ?  ? ? ? ? ? ?Social History  ? ?Tobacco Use  ?Smoking Status Former  ?Smokeless Tobacco Former  ? ? ?Goals Met:  ?Independence with exercise equipment ?Exercise tolerated well ?No report of concerns or symptoms today ?Strength training completed today ? ?Goals Unmet:  ?Not Applicable ? ?Comments: Pt able to follow exercise prescription today without complaint.  Will continue to monitor for progression. ? ? ? ?Dr. Emily Filbert is Medical Director for [place].  ?Dr. Ottie Glazier is Medical Director for [facility] Pulmonary Rehabilitation. ?

## 2021-08-26 ENCOUNTER — Encounter: Payer: 59 | Admitting: *Deleted

## 2021-08-26 DIAGNOSIS — Z951 Presence of aortocoronary bypass graft: Secondary | ICD-10-CM | POA: Diagnosis not present

## 2021-08-26 NOTE — Progress Notes (Signed)
Daily Session Note ? ?Patient Details  ?Name: Frank Wong ?MRN: [id number] ?Date of Birth: 06-03-1955 ?Referring Provider:   ?Flowsheet Row Cardiac Rehab from 08/08/2021 in [facility] Cardiac and Pulmonary Rehab  ?Referring Provider Nehemiah Massed  ? ?  ? ? ?Encounter Date: 08/26/2021 ? ?Check In: ? Session Check In - 08/26/21 0808   ? ?  ? Check-In  ? Supervising physician immediately available to respond to emergencies See telemetry face sheet for immediately available ER MD   ? Location [facility]-Cardiac & Pulmonary Rehab   ? Staff Present Hope Budds, RDN, LDN;Joseph [place], RCP,RRT,BSRT;Heath Lark, RN, BSN, CCRP   ? Virtual Visit No   ? Medication changes reported     No   ? Fall or balance concerns reported    No   ? Warm-up and Cool-down Performed on first and last piece of equipment   ? Resistance Training Performed Yes   ? VAD Patient? No   ? PAD/SET Patient? No   ?  ? Pain Assessment  ? Currently in Pain? No/denies   ? ?  ?  ? ?  ? ? ? ? ? ?Social History  ? ?Tobacco Use  ?Smoking Status Former  ?Smokeless Tobacco Former  ? ? ?Goals Met:  ?Independence with exercise equipment ?Exercise tolerated well ?No report of concerns or symptoms today ? ?Goals Unmet:  ?Not Applicable ? ?Comments: Pt able to follow exercise prescription today without complaint.  Will continue to monitor for progression. ? ? ? ?Dr. Emily Filbert is Medical Director for [place].  ?Dr. Ottie Glazier is Medical Director for [facility] Pulmonary Rehabilitation. ?

## 2021-08-29 ENCOUNTER — Encounter: Payer: 59 | Attending: Internal Medicine | Admitting: *Deleted

## 2021-08-29 DIAGNOSIS — Z955 Presence of coronary angioplasty implant and graft: Secondary | ICD-10-CM | POA: Diagnosis present

## 2021-08-29 DIAGNOSIS — Z951 Presence of aortocoronary bypass graft: Secondary | ICD-10-CM | POA: Diagnosis present

## 2021-08-29 NOTE — Progress Notes (Signed)
Daily Session Note ? ?Patient Details  ?Name: Kyston R Xiang ?MRN: [id number] ?Date of Birth: 11/26/1955 ?Referring Provider:   ?Flowsheet Row Cardiac Rehab from 08/08/2021 in [facility] Cardiac and Pulmonary Rehab  ?Referring Provider [place]  ? ?  ? ? ?Encounter Date: 08/29/2021 ? ?Check In: ? Session Check In - 08/29/21 1041   ? ?  ? Check-In  ? Supervising physician immediately available to respond to emergencies See telemetry face sheet for immediately available ER MD   ? Location [facility]-Cardiac & Pulmonary Rehab   ? Staff Present Meredith Craven, RN BSN;Kelly Hayes, BS, ACSM CEP, Exercise Physiologist;Amanda Sommer, BA, ACSM CEP, Exercise Physiologist;Kelly Bollinger, MPA, RN   ? Virtual Visit No   ? Medication changes reported     No   ? Fall or balance concerns reported    No   ? Warm-up and Cool-down Performed on first and last piece of equipment   ? Resistance Training Performed Yes   ? VAD Patient? No   ? PAD/SET Patient? No   ?  ? Pain Assessment  ? Currently in Pain? No/denies   ? ?  ?  ? ?  ? ? ? ? ? ?Social History  ? ?Tobacco Use  ?Smoking Status Former  ?Smokeless Tobacco Former  ? ? ?Goals Met:  ?Independence with exercise equipment ?Exercise tolerated well ?No report of concerns or symptoms today ?Strength training completed today ? ?Goals Unmet:  ?Not Applicable ? ?Comments: Pt able to follow exercise prescription today without complaint.  Will continue to monitor for progression. ? ? ? ?Dr. Mark Miller is Medical Director for [facility] Cardiac Rehabilitation.  ?Dr. Fuad Aleskerov is Medical Director for [facility] Pulmonary Rehabilitation. ?

## 2021-08-31 DIAGNOSIS — Z951 Presence of aortocoronary bypass graft: Secondary | ICD-10-CM | POA: Diagnosis not present

## 2021-08-31 NOTE — Progress Notes (Signed)
Daily Session Note ? ?Patient Details  ?Name: Frank Wong ?MRN: [id number] ?Date of Birth: Oct 20, 1955 ?Referring Provider:   ?Flowsheet Row Cardiac Rehab from 08/08/2021 in [facility] Cardiac and Pulmonary Rehab  ?Referring Provider Nehemiah Massed  ? ?  ? ? ?Encounter Date: 08/31/2021 ? ?Check In: ? Session Check In - 08/31/21 0729   ? ?  ? Check-In  ? Supervising physician immediately available to respond to emergencies See telemetry face sheet for immediately available ER MD   ? Location [facility]-Cardiac & Pulmonary Rehab   ? Staff Present Birdie Sons, MPA, RN;Joseph [place], RCP,RRT,BSRT;Jessica [place], MA, RCEP, CCRP, CCET   ? Virtual Visit No   ? Medication changes reported     No   ? Fall or balance concerns reported    No   ? Warm-up and Cool-down Performed on first and last piece of equipment   ? Resistance Training Performed Yes   ? VAD Patient? No   ? PAD/SET Patient? No   ?  ? Pain Assessment  ? Currently in Pain? No/denies   ? ?  ?  ? ?  ? ? ? ? ? ?Social History  ? ?Tobacco Use  ?Smoking Status Former  ?Smokeless Tobacco Former  ? ? ?Goals Met:  ?Independence with exercise equipment ?Exercise tolerated well ?No report of concerns or symptoms today ?Strength training completed today ? ?Goals Unmet:  ?Not Applicable ? ?Comments: Pt able to follow exercise prescription today without complaint.  Will continue to monitor for progression. ? ?Reviewed home exercise with pt today.  Pt plans to walk and use weights at home for exercise.  Reviewed THR, pulse, RPE, sign and symptoms, pulse oximetery and when to call 911 or MD.  Also discussed weather considerations and indoor options.  Pt voiced understanding. ? ? ?Dr. Emily Filbert is Medical Director for [place].  ?Dr. Ottie Glazier is Medical Director for [facility] Pulmonary Rehabilitation. ?

## 2021-09-07 ENCOUNTER — Encounter: Payer: 59 | Admitting: *Deleted

## 2021-09-07 DIAGNOSIS — Z951 Presence of aortocoronary bypass graft: Secondary | ICD-10-CM | POA: Diagnosis not present

## 2021-09-07 NOTE — Progress Notes (Signed)
Daily Session Note ? ?Patient Details  ?Name: Frank Wong ?MRN: [id number] ?Date of Birth: 1956/04/23 ?Referring Provider:   ?Flowsheet Row Cardiac Rehab from 08/08/2021 in [facility] Cardiac and Pulmonary Rehab  ?Referring Provider Nehemiah Massed  ? ?  ? ? ?Encounter Date: 09/07/2021 ? ?Check In: ? Session Check In - 09/07/21 0925   ? ?  ? Check-In  ? Supervising physician immediately available to respond to emergencies See telemetry face sheet for immediately available ER MD   ? Location [facility]-Cardiac & Pulmonary Rehab   ? Staff Present Heath Lark, RN, BSN, CCRP;Melissa [place], RDN, Rowe Pavy, BA, ACSM CEP, Exercise Physiologist   ? Virtual Visit No   ? Medication changes reported     No   ? Fall or balance concerns reported    No   ? Warm-up and Cool-down Performed on first and last piece of equipment   ? Resistance Training Performed Yes   ? VAD Patient? No   ? PAD/SET Patient? No   ?  ? Pain Assessment  ? Currently in Pain? No/denies   ? ?  ?  ? ?  ? ? ? ? ? ?Social History  ? ?Tobacco Use  ?Smoking Status Former  ?Smokeless Tobacco Former  ? ? ?Goals Met:  ?Independence with exercise equipment ?Exercise tolerated well ?No report of concerns or symptoms today ? ?Goals Unmet:  ?Not Applicable ? ?Comments: Pt able to follow exercise prescription today without complaint.  Will continue to monitor for progression. ? ? ? ?Dr. Emily Filbert is Medical Director for [place].  ?Dr. Ottie Glazier is Medical Director for [facility] Pulmonary Rehabilitation. ?

## 2021-09-09 ENCOUNTER — Encounter: Payer: 59 | Admitting: *Deleted

## 2021-09-09 DIAGNOSIS — Z951 Presence of aortocoronary bypass graft: Secondary | ICD-10-CM

## 2021-09-09 NOTE — Progress Notes (Signed)
Daily Session Note ? ?Patient Details  ?Name: Frank Wong ?MRN: [id number] ?Date of Birth: 1956-04-25 ?Referring Provider:   ?Flowsheet Row Cardiac Rehab from 08/08/2021 in [facility] Cardiac and Pulmonary Rehab  ?Referring Provider Nehemiah Massed  ? ?  ? ? ?Encounter Date: 09/09/2021 ? ?Check In: ? Session Check In - 09/09/21 0827   ? ?  ? Check-In  ? Supervising physician immediately available to respond to emergencies See telemetry face sheet for immediately available ER MD   ? Location [facility]-Cardiac & Pulmonary Rehab   ? Staff Present Heath Lark, RN, BSN, CCRP;Melissa [place], RDN, LDN;Joseph [place], [place]   ? Virtual Visit No   ? Medication changes reported     No   ? Fall or balance concerns reported    No   ? Warm-up and Cool-down Performed on first and last piece of equipment   ? Resistance Training Performed Yes   ? VAD Patient? No   ? PAD/SET Patient? No   ?  ? Pain Assessment  ? Currently in Pain? No/denies   ? ?  ?  ? ?  ? ? ? ? ? ?Social History  ? ?Tobacco Use  ?Smoking Status Former  ?Smokeless Tobacco Former  ? ? ?Goals Met:  ?Independence with exercise equipment ?Exercise tolerated well ?No report of concerns or symptoms today ? ?Goals Unmet:  ?Not Applicable ? ?Comments: Pt able to follow exercise prescription today without complaint.  Will continue to monitor for progression. ? ? ? ?Dr. Emily Filbert is Medical Director for [place].  ?Dr. Ottie Glazier is Medical Director for [facility] Pulmonary Rehabilitation. ?

## 2021-09-12 ENCOUNTER — Encounter: Payer: 59 | Admitting: *Deleted

## 2021-09-12 DIAGNOSIS — Z951 Presence of aortocoronary bypass graft: Secondary | ICD-10-CM | POA: Diagnosis not present

## 2021-09-12 DIAGNOSIS — Z955 Presence of coronary angioplasty implant and graft: Secondary | ICD-10-CM

## 2021-09-12 NOTE — Progress Notes (Signed)
Daily Session Note ? ?Patient Details  ?Name: Frank Wong ?MRN: [id number] ?Date of Birth: [birth year] ?Referring Provider:   ?Flowsheet Row Cardiac Rehab from 08/08/2021 in [facility] Cardiac and Pulmonary Rehab  ?Referring Provider Nehemiah Massed  ? ?  ? ? ?Encounter Date: 09/12/2021 ? ?Check In: ? Session Check In - 09/12/21 1015   ? ?  ? Check-In  ? Supervising physician immediately available to respond to emergencies See telemetry face sheet for immediately available ER MD   ? Location [facility]-Cardiac & Pulmonary Rehab   ? Staff Present Renita Papa, RN Moises Blood, BS, ACSM CEP, Exercise Physiologist;Amanda Oletta Darter, [place], ACSM CEP, Exercise Physiologist   ? Virtual Visit No   ? Medication changes reported     No   ? Fall or balance concerns reported    No   ? Warm-up and Cool-down Performed on first and last piece of equipment   ? Resistance Training Performed Yes   ? VAD Patient? No   ? PAD/SET Patient? No   ?  ? Pain Assessment  ? Currently in Pain? No/denies   ? ?  ?  ? ?  ? ? ? ? ? ?Social History  ? ?Tobacco Use  ?Smoking Status Former  ?Smokeless Tobacco Former  ? ? ?Goals Met:  ?Independence with exercise equipment ?Exercise tolerated well ?No report of concerns or symptoms today ?Strength training completed today ? ?Goals Unmet:  ?Not Applicable ? ?Comments: Pt able to follow exercise prescription today without complaint.  Will continue to monitor for progression. ? ? ? ? ?Dr. Emily Filbert is Medical Director for [place].  ?Dr. Ottie Glazier is Medical Director for [facility] Pulmonary Rehabilitation. ?

## 2021-09-14 ENCOUNTER — Encounter: Payer: Self-pay | Admitting: *Deleted

## 2021-09-14 DIAGNOSIS — Z951 Presence of aortocoronary bypass graft: Secondary | ICD-10-CM

## 2021-09-14 NOTE — Progress Notes (Signed)
Daily Session Note ? ?Patient Details  ?Name: SEMISI Wong ?MRN: [id number] ?Date of Birth: 1955/11/19 ?Referring Provider:   ?Flowsheet Row Cardiac Rehab from 08/08/2021 in [facility] Cardiac and Pulmonary Rehab  ?Referring Provider Nehemiah Massed  ? ?  ? ? ?Encounter Date: 09/14/2021 ? ?Check In: ? Session Check In - 09/14/21 0745   ? ?  ? Check-In  ? Supervising physician immediately available to respond to emergencies See telemetry face sheet for immediately available ER MD   ? Location [facility]-Cardiac & Pulmonary Rehab   ? Staff Present Birdie Sons, MPA, RN;Amanda Sommer, BA, ACSM CEP, Exercise Physiologist;Joseph [place], [place]   ? Virtual Visit No   ? Medication changes reported     No   ? Fall or balance concerns reported    No   ? Warm-up and Cool-down Performed on first and last piece of equipment   ? Resistance Training Performed Yes   ? VAD Patient? No   ? PAD/SET Patient? No   ?  ? Pain Assessment  ? Currently in Pain? No/denies   ? ?  ?  ? ?  ? ? ? ? ? ?Social History  ? ?Tobacco Use  ?Smoking Status Former  ?Smokeless Tobacco Former  ? ? ?Goals Met:  ?Independence with exercise equipment ?Exercise tolerated well ?No report of concerns or symptoms today ?Strength training completed today ? ?Goals Unmet:  ?Not Applicable ? ?Comments: Pt able to follow exercise prescription today without complaint.  Will continue to monitor for progression. ? ? ? ?Dr. Emily Filbert is Medical Director for [place].  ?Dr. Ottie Glazier is Medical Director for [facility] Pulmonary Rehabilitation. ?

## 2021-09-14 NOTE — Progress Notes (Signed)
Cardiac Individual Treatment Plan ? ?Patient Details  ?Name: Frank Wong ?MRN: [id number] ?Date of Birth: 1955/07/14 ?Referring Provider:   ?Flowsheet Row Cardiac Rehab from 08/08/2021 in [facility] Cardiac and Pulmonary Rehab  ?Referring Provider Nehemiah Massed  ? ?  ? ? ?Initial Encounter Date:  ?Flowsheet Row Cardiac Rehab from 08/08/2021 in [facility] Cardiac and Pulmonary Rehab  ?Date 08/08/21  ? ?  ? ? ?Visit Diagnosis: S/P CABG x 2 ? ?Patient's Home Medications on Admission: ? ?Current Outpatient Medications:  ?  amLODipine (NORVASC) 2.5 MG tablet, Take 2.5 mg by mouth daily. (Patient not taking: Reported on 07/22/2021), Disp: , Rfl:  ?  amphetamine-dextroamphetamine (ADDERALL XR) 10 MG 24 hr capsule, Take 10 mg by mouth daily., Disp: , Rfl:  ?  aspirin EC 81 MG tablet, Take 81 mg by mouth in the morning., Disp: , Rfl:  ?  atorvastatin (LIPITOR) 80 MG tablet, Take 80 mg by mouth in the morning., Disp: , Rfl:  ?  B Complex-C (B-COMPLEX WITH VITAMIN C) tablet, Take 1 tablet by mouth in the morning., Disp: , Rfl:  ?  benazepril (LOTENSIN) 20 MG tablet, Take 20 mg by mouth in the morning. (Patient not taking: Reported on 07/22/2021), Disp: , Rfl:  ?  clonazePAM (KLONOPIN) 2 MG tablet, Take 1 mg by mouth at bedtime as needed (sleep/ anxiety). (Patient not taking: Reported on 07/22/2021), Disp: , Rfl:  ?  empagliflozin (JARDIANCE) 10 MG TABS tablet, Take 10 mg by mouth daily., Disp: , Rfl:  ?  ferrous sulfate 325 (65 FE) MG tablet, Take 325 mg by mouth in the morning., Disp: , Rfl:  ?  fexofenadine (ALLEGRA) 180 MG tablet, Take 180 mg by mouth in the morning., Disp: , Rfl:  ?  Fluticasone-Umeclidin-Vilant 100-62.5-25 MCG/ACT AEPB, Inhale 1 puff into the lungs daily., Disp: , Rfl:  ?  gabapentin (NEURONTIN) 600 MG tablet, Take 600 mg by mouth at bedtime., Disp: , Rfl:  ?  glipiZIDE (GLUCOTROL) 10 MG tablet, Take 10 mg by mouth every morning., Disp: , Rfl:  ?  JARDIANCE 25 MG TABS tablet, Take 25 mg by mouth in the morning., Disp: , Rfl:   ?  levocetirizine (XYZAL) 5 MG tablet, Take 5 mg by mouth every evening., Disp: , Rfl:  ?  Lifitegrast (XIIDRA) 5 % SOLN, Place 1 drop into both eyes daily., Disp: , Rfl:  ?  magnesium oxide (MAG-OX) 400 MG tablet, Take 400 mg by mouth in the morning., Disp: , Rfl:  ?  melatonin 5 MG TABS, Take 5 mg by mouth at bedtime as needed (sleep). (Patient not taking: Reported on 06/27/2021), Disp: , Rfl:  ?  metFORMIN (GLUCOPHAGE) 1000 MG tablet, Take 1,000 mg by mouth 2 (two) times daily with a meal. , Disp: , Rfl:  ?  montelukast (SINGULAIR) 10 MG tablet, Take 10 mg by mouth at bedtime., Disp: , Rfl:  ?  Multiple Vitamin (MULTIVITAMIN WITH MINERALS) TABS tablet, Take 1 tablet by mouth in the morning., Disp: , Rfl:  ?  pantoprazole (PROTONIX) 40 MG tablet, Take 40 mg by mouth at bedtime., Disp: , Rfl:  ?  pimecrolimus (ELIDEL) 1 % cream, Apply topically 2 (two) times daily. Use for 2 weeks at affected areas of eyelids. Can also use at scrotum once daily., Disp: 60 g, Rfl: 11 ?  sertraline (ZOLOFT) 25 MG tablet, Take 25 mg by mouth daily., Disp: , Rfl:  ?  Testosterone 20.25 MG/ACT (1.62%) GEL, Apply 2 Pump topically in the morning.  Applied to shoulders., Disp: , Rfl:  ?  triamcinolone cream (KENALOG) 0.1 %, Apply 1 application topically 2 (two) times daily as needed (skin irritation/itching). (Patient not taking: Reported on 06/27/2021), Disp: , Rfl:  ? ?Past Medical History: ?Past Medical History:  ?Diagnosis Date  ? Alcoholism ([place])   ? Asthma   ? CAD (coronary artery disease)   ? Cardiac arrest [facility])   ? Chronic cardiac arrhythmia   ? Chronic kidney disease   ? COPD (chronic obstructive pulmonary disease) ([place])   ? Depression   ? Diabetes mellitus without complication ([place])   ? Essential tremor   ? GERD (gastroesophageal reflux disease)   ? Heart attack ([place])   ? Heart murmur   ? Hyperlipidemia   ? Hypertension   ? IBS (irritable bowel syndrome)   ? Shoulder problem 2010  ? right  ? Sinoatrial node dysfunction (H[facility])   ?  Sleep apnea   ? ? ?Tobacco Use: ?Social History  ? ?Tobacco Use  ?Smoking Status Former  ?Smokeless Tobacco Former  ? ? ?Labs: ?Review Flowsheet   ? ?    ? View : No data to display.  ?  ?  ?  ?  ?  ? ? ? ?Exercise Target Goals: ?Exercise Program Goal: ?Individual exercise prescription set using results from initial 6 min walk test and THRR while considering  patient?s activity barriers and safety.  ? ?Exercise Prescription Goal: ?Initial exercise prescription builds to 30-45 minutes a day of aerobic activity, 2-3 days per week.  Home exercise guidelines will be given to patient during program as part of exercise prescription that the participant will acknowledge. ? ? ?Education: Aerobic Exercise: ?- Group verbal and visual presentation on the components of exercise prescription. Introduces F.I.T.T principle from ACSM for exercise prescriptions.  Reviews F.I.T.T. principles of aerobic exercise including progression. Written material given at graduation. ?Flowsheet Row Cardiac Rehab from 12/29/2020 in [facility] Cardiac and Pulmonary Rehab  ?Education need identified 11/01/20  ?Date 11/17/20  ?Educator [facility]  ?Instruction Review Code 1- Verbalizes Understanding  ? ?  ? ? ?Education: Resistance Exercise: ?- Group verbal and visual presentation on the components of exercise prescription. Introduces F.I.T.T principle from ACSM for exercise prescriptions  Reviews F.I.T.T. principles of resistance exercise including progression. Written material given at graduation. ?Flowsheet Row Cardiac Rehab from 12/29/2020 in [facility] Cardiac and Pulmonary Rehab  ?Date 11/24/20  ?Educator [facility]  ?Instruction Review Code 1- Verbalizes Understanding  ? ?  ? ?  ?Education: Exercise & Equipment Safety: ?- Individual verbal instruction and demonstration of equipment use and safety with use of the equipment. ?Flowsheet Row Cardiac Rehab from 09/14/2021 in [facility] Cardiac and Pulmonary Rehab  ?Date 08/08/21  ?Educator [place]  ?Instruction Review Code 1- Verbalizes  Understanding  ? ?  ? ? ?Education: Exercise Physiology & General Exercise Guidelines: ?- Group verbal and written instruction with models to review the exercise physiology of the cardiovascular system and associated critical values. Provides general exercise guidelines with specific guidelines to those with heart or lung disease.  ?Flowsheet Row Cardiac Rehab from 12/29/2020 in [facility] Cardiac and Pulmonary Rehab  ?Date 11/10/20  ?Educator [facility]  ?Instruction Review Code 1- Verbalizes Understanding  ? ?  ? ? ?Education: Flexibility, Balance, Mind/Body Relaxation: ?- Group verbal and visual presentation with interactive activity on the components of exercise prescription. Introduces F.I.T.T principle from ACSM for exercise prescriptions. Reviews F.I.T.T. principles of flexibility and balance exercise training including progression. Also discusses the mind body connection.  Reviews various relaxation techniques to help reduce and manage stress (i.e. Deep breathing, progressive muscle relaxation, and visualization). Balance handout provided to take home. Written material given at graduation. ?Flowsheet Row Cardiac Rehab from 12/29/2020 in [facility] Cardiac and Pulmonary Rehab  ?Date 12/01/20  ?Educator [facility]  ?Instruction Review Code 1- Verbalizes Understanding  ? ?  ? ? ?Activity Barriers & Risk Stratification: ? Activity Barriers & Cardiac Risk Stratification - 07/22/21 1302   ? ?  ? Activity Barriers & Cardiac Risk Stratification  ? Activity Barriers Shortness of Breath;Joint Problems   ? Cardiac Risk Stratification High   ? ?  ?  ? ?  ? ? ?6 Minute Walk: ? 6 Minute Walk   ? ? [place] Name 08/08/21 1557  ?  ?  ?  ? 6 Minute Walk  ? Phase Initial    ? Distance 1420 feet    ? Walk Time 6 minutes    ? # of Rest Breaks 0    ? MPH 2.69    ? METS 3.46    ? RPE 9    ? Perceived Dyspnea  2    ? VO2 Peak 12.11    ? Symptoms No    ? Resting HR 67 bpm    ? Resting BP 92/60    ? Resting Oxygen Saturation  98 %    ? Exercise Oxygen Saturation   during 6 min walk 95 %    ? Max Ex. HR 97 bpm    ? Max Ex. BP 112/62    ? 2 Minute Post BP 104/60    ? ?  ?  ? ?  ? ? ?Oxygen Initial Assessment: ? ? ?Oxygen Re-Evaluation: ? ? ?Oxygen Discharge (Final Oxygen

## 2021-09-19 ENCOUNTER — Ambulatory Visit: Payer: 59

## 2021-09-21 DIAGNOSIS — Z951 Presence of aortocoronary bypass graft: Secondary | ICD-10-CM

## 2021-09-21 NOTE — Progress Notes (Signed)
Daily Session Note ? ?Patient Details  ?Name: Frank Wong ?MRN: [id number] ?Date of Birth: 20-Feb-1956 ?Referring Provider:   ?Flowsheet Row Cardiac Rehab from 08/08/2021 in [facility] Cardiac and Pulmonary Rehab  ?Referring Provider Nehemiah Massed  ? ?  ? ? ?Encounter Date: 09/21/2021 ? ?Check In: ? Session Check In - 09/21/21 0749   ? ?  ? Check-In  ? Supervising physician immediately available to respond to emergencies See telemetry face sheet for immediately available ER MD   ? Location [facility]-Cardiac & Pulmonary Rehab   ? Staff Present Birdie Sons, MPA, RN;Joseph [place], [place]ren [place], MS, ASCM CEP, Exercise Physiologist;Jessica Luan Pulling, MA, RCEP, CCRP, CCET   ? Virtual Visit No   ? Medication changes reported     No   ? Fall or balance concerns reported    No   ? Warm-up and Cool-down Performed on first and last piece of equipment   ? Resistance Training Performed Yes   ? VAD Patient? No   ? PAD/SET Patient? No   ?  ? Pain Assessment  ? Currently in Pain? No/denies   ? ?  ?  ? ?  ? ? ? ? ? ?Social History  ? ?Tobacco Use  ?Smoking Status Former  ?Smokeless Tobacco Former  ? ? ?Goals Met:  ?Independence with exercise equipment ?Exercise tolerated well ?No report of concerns or symptoms today ?Strength training completed today ? ?Goals Unmet:  ?Not Applicable ? ?Comments: Pt able to follow exercise prescription today without complaint.  Will continue to monitor for progression. ? ? ? ?Dr. Emily Filbert is Medical Director for [place].  ?Dr. Ottie Glazier is Medical Director for [facility] Pulmonary Rehabilitation. ?

## 2021-09-23 ENCOUNTER — Encounter: Payer: 59 | Admitting: *Deleted

## 2021-09-23 DIAGNOSIS — Z951 Presence of aortocoronary bypass graft: Secondary | ICD-10-CM

## 2021-09-23 NOTE — Progress Notes (Signed)
Daily Session Note ? ?Patient Details  ?Name: Frank Wong ?MRN: [id number] ?Date of Birth: 03-Nov-1955 ?Referring Provider:   ?Flowsheet Row Cardiac Rehab from 08/08/2021 in [facility] Cardiac and Pulmonary Rehab  ?Referring Provider Nehemiah Massed  ? ?  ? ? ?Encounter Date: 09/23/2021 ? ?Check In: ? Session Check In - 09/23/21 0810   ? ?  ? Check-In  ? Supervising physician immediately available to respond to emergencies See telemetry face sheet for immediately available ER MD   ? Location [facility]-Cardiac & Pulmonary Rehab   ? Staff Present Heath Lark, RN, BSN, CCRP;Joseph [place], [place], [place], [place], [place], CCET   ? Virtual Visit No   ? Medication changes reported     No   ? Fall or balance concerns reported    No   ? Warm-up and Cool-down Performed on first and last piece of equipment   ? Resistance Training Performed Yes   ? VAD Patient? No   ? PAD/SET Patient? No   ?  ? Pain Assessment  ? Currently in Pain? No/denies   ? ?  ?  ? ?  ? ? ? ? ? ?Social History  ? ?Tobacco Use  ?Smoking Status Former  ?Smokeless Tobacco Former  ? ? ?Goals Met:  ?Independence with exercise equipment ?Exercise tolerated well ?No report of concerns or symptoms today ? ?Goals Unmet:  ?Not Applicable ? ?Comments: Pt able to follow exercise prescription today without complaint.  Will continue to monitor for progression. ? ? ? ?Dr. Emily Filbert is Medical Director for [place].  ?Dr. Ottie Glazier is Medical Director for [facility] Pulmonary Rehabilitation. ?

## 2021-09-26 ENCOUNTER — Encounter: Payer: 59 | Attending: Internal Medicine | Admitting: *Deleted

## 2021-09-26 DIAGNOSIS — Z951 Presence of aortocoronary bypass graft: Secondary | ICD-10-CM | POA: Diagnosis present

## 2021-09-26 DIAGNOSIS — Z48812 Encounter for surgical aftercare following surgery on the circulatory system: Secondary | ICD-10-CM | POA: Insufficient documentation

## 2021-09-26 NOTE — Progress Notes (Signed)
Daily Session Note ? ?Patient Details  ?Name: Frank Wong ?MRN: [id number] ?Date of Birth: 11-22-1955 ?Referring Provider:   ?Flowsheet Row Cardiac Rehab from 08/08/2021 in [facility] Cardiac and Pulmonary Rehab  ?Referring Provider Nehemiah Massed  ? ?  ? ? ?Encounter Date: 09/26/2021 ? ?Check In: ? Session Check In - 09/26/21 1031   ? ?  ? Check-In  ? Supervising physician immediately available to respond to emergencies See telemetry face sheet for immediately available ER MD   ? Location [facility]Card[facility] & Pul[facility] Rehab   ? Staff Present Birdie Sons, MPA, RN;Kelly Amedeo Plenty, BS, ACSM CEP, Exercise Physiologist;Tashana Haberl, RN, BSN, CCRP   ? Virtual Visit No   ? Medication changes reported     No   ? Fall or balance concerns reported    No   ? Warm-up and Cool-down Performed on first and last piece of equipment   ? Resistance Training Performed Yes   ? VAD Patient? No   ? PAD/SET Patient? No   ?  ? Pain Assessment  ? Currently in Pain? No/denies   ? ?  ?  ? ?  ? ? ? ? ? ?Social History  ? ?Tobacco Use  ?Smoking Status Former  ?Smokeless Tobacco Former  ? ? ?Goals Met:  ?Independence with exercise equipment ?Exercise tolerated well ?No report of concerns or symptoms today ? ?Goals Unmet:  ?Not Applicable ? ?Comments: Pt able to follow exercise prescription today without complaint.  Will continue to monitor for progression. ? ? ? ?Dr. Emily Filbert is Medical Director for [place].  ?Dr. Ottie Glazier is Medical Director for [facility] Pulmonary Rehabilitation. ?

## 2021-09-28 DIAGNOSIS — Z951 Presence of aortocoronary bypass graft: Secondary | ICD-10-CM

## 2021-09-28 NOTE — Progress Notes (Signed)
Daily Session Note ? ?Patient Details  ?Name: Frank Wong ?MRN: [id number] ?Date of Birth: 1955-10-24 ?Referring Provider:   ?Flowsheet Row Cardiac Rehab from 08/08/2021 in [facility] Cardiac and Pulmonary Rehab  ?Referring Provider Nehemiah Massed  ? ?  ? ? ?Encounter Date: 09/28/2021 ? ?Check In: ? Session Check In - 09/28/21 0746   ? ?  ? Check-In  ? Supervising physician immediately available to respond to emergencies See telemetry face sheet for immediately available ER MD   ? Location [facility]Card[facility] Pulmonary Rehab   ? Staff Present Birdie Sons, MPA, RN;Melissa [place], RDN, LDN;Joseph [place], RCP,RRT,BSRT   ? Virtual Visit No   ? Medication changes reported     No   ? Fall or balance concerns reported    No   ? Warm-up and Cool-down Performed on first and last piece of equipment   ? Resistance Training Performed Yes   ? VAD Patient? No   ? PAD/SET Patient? No   ?  ? Pain Assessment  ? Currently in Pain? No/denies   ? ?  ?  ? ?  ? ? ? ? ? ?Social History  ? ?Tobacco Use  ?Smoking Status Former  ?Smokeless Tobacco Former  ? ? ?Goals Met:  ?Independence with exercise equipment ?Exercise tolerated well ?No report of concerns or symptoms today ?Strength training completed today ? ?Goals Unmet:  ?Not Applicable ? ?Comments: Pt able to follow exercise prescription today without complaint.  Will continue to monitor for progression. ? ? ? ?Dr. Emily Filbert is Medical Director for [place].  ?Dr. Ottie Glazier is Medical Director for [facility] Pulmonary Rehabilitation. ?

## 2021-09-30 ENCOUNTER — Encounter: Payer: 59 | Admitting: *Deleted

## 2021-09-30 DIAGNOSIS — Z951 Presence of aortocoronary bypass graft: Secondary | ICD-10-CM | POA: Diagnosis not present

## 2021-09-30 NOTE — Progress Notes (Signed)
Daily Session Note ? ?Patient Details  ?Name: Frank Wong ?MRN: [id number] ?Date of Birth: [birth year] ?Referring Provider:   ?Flowsheet Row Cardiac Rehab from 08/08/2021 in [facility] Cardiac and Pulmonary Rehab  ?Referring Provider Nehemiah Massed  ? ?  ? ? ?Encounter Date: 09/30/2021 ? ?Check In: ? Session Check In - 09/30/21 0808   ? ?  ? Check-In  ? Supervising physician immediately available to respond to emergencies See telemetry face sheet for immediately available ER MD   ? Location [facility] Rehab   ? Staff Present Heath Lark, RN, BSN, CCRP;Joseph [place], [place], [place], [place], [place], CCET   ? Virtual Visit No   ? Medication changes reported     No   ? Fall or balance concerns reported    No   ? Warm-up and Cool-down Performed on first and last piece of equipment   ? Resistance Training Performed Yes   ? VAD Patient? No   ? PAD/SET Patient? No   ?  ? Pain Assessment  ? Currently in Pain? No/denies   ? ?  ?  ? ?  ? ? ? ? ? ?Social History  ? ?Tobacco Use  ?Smoking Status Former  ?Smokeless Tobacco Former  ? ? ?Goals Met:  ?Independence with exercise equipment ?Exercise tolerated well ?No report of concerns or symptoms today ? ?Goals Unmet:  ?Not Applicable ? ?Comments: Pt able to follow exercise prescription today without complaint.  Will continue to monitor for progression. ? ? ? ?Dr. Emily Filbert is Medical Director for [place].  ?Dr. Ottie Glazier is Medical Director for [facility] Pulmonary Rehabilitation. ?

## 2021-10-03 ENCOUNTER — Encounter: Payer: 59 | Admitting: *Deleted

## 2021-10-03 DIAGNOSIS — Z951 Presence of aortocoronary bypass graft: Secondary | ICD-10-CM

## 2021-10-03 NOTE — Progress Notes (Signed)
Daily Session Note ? ?Patient Details  ?Name: Frank Wong ?MRN: [id number] ?Date of Birth: [birth year] ?Referring Provider:   ?Flowsheet Row Cardiac Rehab from 08/08/2021 in [facility] Cardiac and Pulmonary Rehab  ?Referring Provider Nehemiah Massed  ? ?  ? ? ?Encounter Date: 10/03/2021 ? ?Check In: ? Session Check In - 10/03/21 0804   ? ?  ? Check-In  ? Supervising physician immediately available to respond to emergencies See telemetry face sheet for immediately available ER MD   ? Location [facility] & Pul[facility] Rehab   ? Staff Present Heath Lark, RN, BSN, CCRP;Melissa [place], RDN, Wilhelmina Mcard[place], BS, ACSM CEP, Exercise Physiologist   ? Virtual Visit No   ? Medication changes reported     No   ? Fall or balance concerns reported    No   ? Warm-up and Cool-down Performed on first and last piece of equipment   ? Resistance Training Performed Yes   ? VAD Patient? No   ? PAD/SET Patient? No   ?  ? Pain Assessment  ? Currently in Pain? No/denies   ? ?  ?  ? ?  ? ? ? ? ? ?Social History  ? ?Tobacco Use  ?Smoking Status Former  ?Smokeless Tobacco Former  ? ? ?Goals Met:  ?Independence with exercise equipment ?Exercise tolerated well ?No report of concerns or symptoms today ? ?Goals Unmet:  ?Not Applicable ? ?Comments: Pt able to follow exercise prescription today without complaint.  Will continue to monitor for progression. ? ? ? ?Dr. Emily Filbert is Medical Director for [place].  ?Dr. Ottie Glazier is Medical Director for [facility] Pulmonary Rehabilitation. ?

## 2021-10-05 DIAGNOSIS — Z951 Presence of aortocoronary bypass graft: Secondary | ICD-10-CM | POA: Diagnosis not present

## 2021-10-05 NOTE — Progress Notes (Signed)
Daily Session Note ? ?Patient Details  ?Name: ARIK HUSMANN ?MRN: [id number] ?Date of Birth: 06-May-1956 ?Referring Provider:   ?Flowsheet Row Cardiac Rehab from 08/08/2021 in [facility] Cardiac and Pulmonary Rehab  ?Referring Provider Nehemiah Massed  ? ?  ? ? ?Encounter Date: 10/05/2021 ? ?Check In: ? Session Check In - 10/05/21 0723   ? ?  ? Check-In  ? Supervising physician immediately available to respond to emergencies See telemetry face sheet for immediately available ER MD   ? Location [facility]Card[facility] & Pulmonary Rehab   ? Staff Present Birdie Sons, MPA, RN;Melissa [place], RDN, LDN;Joseph [place], RCP,RRT,BSRT   ? Virtual Visit No   ? Medication changes reported     No   ? Fall or balance concerns reported    No   ? Warm-up and Cool-down Performed on first and last piece of equipment   ? Resistance Training Performed Yes   ? VAD Patient? No   ? PAD/SET Patient? No   ?  ? Pain Assessment  ? Currently in Pain? No/denies   ? ?  ?  ? ?  ? ? ? ? ? ?Social History  ? ?Tobacco Use  ?Smoking Status Former  ?Smokeless Tobacco Former  ? ? ?Goals Met:  ?Independence with exercise equipment ?Exercise tolerated well ?No report of concerns or symptoms today ?Strength training completed today ? ?Goals Unmet:  ?Not Applicable ? ?Comments: Pt able to follow exercise prescription today without complaint.  Will continue to monitor for progression. ? ? ? ?Dr. Emily Filbert is Medical Director for [place].  ?Dr. Ottie Glazier is Medical Director for [facility] Pulmonary Rehabilitation. ?

## 2021-10-07 ENCOUNTER — Encounter: Payer: 59 | Admitting: *Deleted

## 2021-10-07 DIAGNOSIS — Z951 Presence of aortocoronary bypass graft: Secondary | ICD-10-CM

## 2021-10-07 NOTE — Progress Notes (Signed)
Daily Session Note ? ?Patient Details  ?Name: Frank Wong ?MRN: [id number] ?Date of Birth: 23-Dec-1955 ?Referring Provider:   ?Flowsheet Row Cardiac Rehab from 08/08/2021 in [facility]iac [facility]monary Rehab  ?Referring Provider Nehemiah Massed  ? ?  ? ? ?Encounter Date: 10/07/2021 ? ?Check In: ? Session Check In - 10/07/21 0807   ? ?  ? Check-In  ? Supervising physician immediately available to respond to emergencies See telemetry face sheet for immediately available ER MD   ? Location [facility]Card[facility] & Pulmonary Rehab   ? Staff Present Heath Lark, RN, BSN, CCRP;Laureen Owens Shark, BS, RRT, CPFT;Joseph [place], [place]   ? Virtual Visit No   ? Medication changes reported     No   ? Fall or balance concerns reported    No   ? Warm-up and Cool-down Performed on first and last piece of equipment   ? Resistance Training Performed Yes   ? VAD Patient? No   ? PAD/SET Patient? No   ?  ? Pain Assessment  ? Currently in Pain? No/denies   ? ?  ?  ? ?  ? ? ? ? ? ?Social History  ? ?Tobacco Use  ?Smoking Status Former  ?Smokeless Tobacco Former  ? ? ?Goals Met:  ?Independence with exercise equipment ?Exercise tolerated well ?No report of concerns or symptoms today ? ?Goals Unmet:  ?Not Applicable ? ?Comments: Pt able to follow exercise prescription today without complaint.  Will continue to monitor for progression. ? ? ? ?Dr. Emily Filbert is Medical Director for [place].  ?Dr. Ottie Glazier is Medical Director for [facility] Pulmonary Rehabilitation. ?

## 2021-10-10 ENCOUNTER — Encounter: Payer: 59 | Admitting: *Deleted

## 2021-10-10 DIAGNOSIS — Z951 Presence of aortocoronary bypass graft: Secondary | ICD-10-CM | POA: Diagnosis not present

## 2021-10-10 NOTE — Progress Notes (Signed)
Daily Session Note ? ?Patient Details  ?Name: Frank Wong ?MRN: [id number] ?Date of Birth: 11-Jun-1955 ?Referring Provider:   ?Flowsheet Row Cardiac Rehab from 08/08/2021 in [facility] Cardiac and Pulmonary Rehab  ?Referring Provider Nehemiah Massed  ? ?  ? ? ?Encounter Date: 10/10/2021 ? ?Check In: ? Session Check In - 10/10/21 0736   ? ?  ? Check-In  ? Supervising physician immediately available to respond to emergencies See telemetry face sheet for immediately available ER MD   ? Location [facility]Card[facility] & Pulmonary Rehab   ? Staff Present Heath Lark, RN, BSN, CCRP;Joseph [place], RCP,RRT,BSRT;Kelly [place], [place], ACSM CEP, Exercise Physiologist   ? Virtual Visit No   ? Medication changes reported     No   ? Fall or balance concerns reported    No   ? Warm-up and Cool-down Performed on first and last piece of equipment   ? Resistance Training Performed Yes   ? VAD Patient? No   ? PAD/SET Patient? No   ?  ? Pain Assessment  ? Currently in Pain? No/denies   ? ?  ?  ? ?  ? ? New Med: Ozempic added ? ? ? ?Social History  ? ?Tobacco Use  ?Smoking Status Former  ?Smokeless Tobacco Former  ? ? ?Goals Met:  ?Independence with exercise equipment ?Exercise tolerated well ?No report of concerns or symptoms today ? ?Goals Unmet:  ?Not Applicable ? ?Comments: Pt able to follow exercise prescription today without complaint.  Will continue to monitor for progression. ? ? ? ?Dr. Emily Filbert is Medical Director for [place].  ?Dr. Ottie Glazier is Medical Director for [facility] Pulmonary Rehabilitation. ?

## 2021-10-12 ENCOUNTER — Encounter: Payer: Self-pay | Admitting: *Deleted

## 2021-10-12 DIAGNOSIS — Z951 Presence of aortocoronary bypass graft: Secondary | ICD-10-CM

## 2021-10-12 NOTE — Progress Notes (Signed)
Daily Session Note ? ?Patient Details  ?Name: Frank Wong ?MRN: [id number] ?Date of Birth: 06/24/1955 ?Referring Provider:   ?Flowsheet Row Cardiac Rehab from 08/08/2021 in [facility] Cardiac and Pulmonary Rehab  ?Referring Provider Nehemiah Massed  ? ?  ? ? ?Encounter Date: 10/12/2021 ? ?Check In: ? Session Check In - 10/12/21 0720   ? ?  ? Check-In  ? Supervising physician immediately available to respond to emergencies See telemetry face sheet for immediately available ER MD   ? Location [facility]-Cardiac & Pulmonary Rehab   ? Staff Present Birdie Sons, MPA, RN;Melissa [place], RDN, LDN;Joseph [place], RCP,RRT,BSRT   ? Virtual Visit No   ? Medication changes reported     No   ? Fall or balance concerns reported    No   ? Warm-up and Cool-down Performed on first and last piece of equipment   ? Resistance Training Performed Yes   ? VAD Patient? No   ? PAD/SET Patient? No   ?  ? Pain Assessment  ? Currently in Pain? No/denies   ? ?  ?  ? ?  ? ? ? ? ? ?Social History  ? ?Tobacco Use  ?Smoking Status Former  ?Smokeless Tobacco Former  ? ? ?Goals Met:  ?Independence with exercise equipment ?Exercise tolerated well ?No report of concerns or symptoms today ?Strength training completed today ? ?Goals Unmet:  ?Not Applicable ? ?Comments: Pt able to follow exercise prescription today without complaint.  Will continue to monitor for progression. ? ? ? ?Dr. Emily Filbert is Medical Director for [place].  ?Dr. Ottie Glazier is Medical Director for [facility] Pulmonary Rehabilitation. ?

## 2021-10-12 NOTE — Progress Notes (Signed)
Cardiac Individual Treatment Plan ? ?Patient Details  ?Name: Frank Wong ?MRN: [id number] ?Date of Birth: 13-Feb-1956 ?Referring Provider:   ?Flowsheet Row Cardiac Rehab from 08/08/2021 in [facility] Cardiac and Pulmonary Rehab  ?Referring Provider Nehemiah Massed  ? ?  ? ? ?Initial Encounter Date:  ?Flowsheet Row Cardiac Rehab from 08/08/2021 in [facility] Cardiac and Pulmonary Rehab  ?Date 08/08/21  ? ?  ? ? ?Visit Diagnosis: S/P CABG x 2 ? ?Patient's Home Medications on Admission: ? ?Current Outpatient Medications:  ?  amLODipine (NORVASC) 2.5 MG tablet, Take 2.5 mg by mouth daily. (Patient not taking: Reported on 07/22/2021), Disp: , Rfl:  ?  amphetamine-dextroamphetamine (ADDERALL XR) 10 MG 24 hr capsule, Take 10 mg by mouth daily., Disp: , Rfl:  ?  aspirin EC 81 MG tablet, Take 81 mg by mouth in the morning., Disp: , Rfl:  ?  atorvastatin (LIPITOR) 80 MG tablet, Take 80 mg by mouth in the morning., Disp: , Rfl:  ?  B Complex-C (B-COMPLEX WITH VITAMIN C) tablet, Take 1 tablet by mouth in the morning., Disp: , Rfl:  ?  benazepril (LOTENSIN) 20 MG tablet, Take 20 mg by mouth in the morning. (Patient not taking: Reported on 07/22/2021), Disp: , Rfl:  ?  clonazePAM (KLONOPIN) 2 MG tablet, Take 1 mg by mouth at bedtime as needed (sleep/ anxiety). (Patient not taking: Reported on 07/22/2021), Disp: , Rfl:  ?  empagliflozin (JARDIANCE) 10 MG TABS tablet, Take 10 mg by mouth daily., Disp: , Rfl:  ?  ferrous sulfate 325 (65 FE) MG tablet, Take 325 mg by mouth in the morning., Disp: , Rfl:  ?  fexofenadine (ALLEGRA) 180 MG tablet, Take 180 mg by mouth in the morning., Disp: , Rfl:  ?  Fluticasone-Umeclidin-Vilant 100-62.5-25 MCG/ACT AEPB, Inhale 1 puff into the lungs daily., Disp: , Rfl:  ?  gabapentin (NEURONTIN) 600 MG tablet, Take 600 mg by mouth at bedtime., Disp: , Rfl:  ?  glipiZIDE (GLUCOTROL) 10 MG tablet, Take 10 mg by mouth every morning., Disp: , Rfl:  ?  JARDIANCE 25 MG TABS tablet, Take 25 mg by mouth in the morning., Disp: , Rfl:   ?  levocetirizine (XYZAL) 5 MG tablet, Take 5 mg by mouth every evening., Disp: , Rfl:  ?  Lifitegrast (XIIDRA) 5 % SOLN, Place 1 drop into both eyes daily., Disp: , Rfl:  ?  magnesium oxide (MAG-OX) 400 MG tablet, Take 400 mg by mouth in the morning., Disp: , Rfl:  ?  melatonin 5 MG TABS, Take 5 mg by mouth at bedtime as needed (sleep). (Patient not taking: Reported on 06/27/2021), Disp: , Rfl:  ?  metFORMIN (GLUCOPHAGE) 1000 MG tablet, Take 1,000 mg by mouth 2 (two) times daily with a meal. , Disp: , Rfl:  ?  montelukast (SINGULAIR) 10 MG tablet, Take 10 mg by mouth at bedtime., Disp: , Rfl:  ?  Multiple Vitamin (MULTIVITAMIN WITH MINERALS) TABS tablet, Take 1 tablet by mouth in the morning., Disp: , Rfl:  ?  pantoprazole (PROTONIX) 40 MG tablet, Take 40 mg by mouth at bedtime., Disp: , Rfl:  ?  pimecrolimus (ELIDEL) 1 % cream, Apply topically 2 (two) times daily. Use for 2 weeks at affected areas of eyelids. Can also use at scrotum once daily., Disp: 60 g, Rfl: 11 ?  Semaglutide,0.25 or 0.'5MG'$ /DOS, 2 MG/3ML SOPN, Inject 0.25 mg weekly for the first 4 weeks, then increase to 0.5 mg weekly., Disp: , Rfl:  ?  sertraline (ZOLOFT)  25 MG tablet, Take 25 mg by mouth daily., Disp: , Rfl:  ?  Testosterone 20.25 MG/ACT (1.62%) GEL, Apply 2 Pump topically in the morning. Applied to shoulders., Disp: , Rfl:  ?  triamcinolone cream (KENALOG) 0.1 %, Apply 1 application topically 2 (two) times daily as needed (skin irritation/itching). (Patient not taking: Reported on 06/27/2021), Disp: , Rfl:  ? ?Past Medical History: ?Past Medical History:  ?Diagnosis Date  ? Alcoholism ([place])   ? Asthma   ? CAD (coronary artery disease)   ? Cardiac arrest [facility])   ? Chronic cardiac arrhythmia   ? Chronic kidney disease   ? COPD (chronic obstructive pulmonary disease) ([place])   ? Depression   ? Diabetes mellitus without complication ([place])   ? Essential tremor   ? GERD (gastroesophageal reflux disease)   ? Heart attack ([place])   ? Heart murmur   ?  Hyperlipidemia   ? Hypertension   ? IBS (irritable bowel syndrome)   ? Shoulder problem 2010  ? right  ? Sinoatrial node dysfunction ([facility])   ? Sleep apnea   ? ? ?Tobacco Use: ?Social History  ? ?Tobacco Use  ?Smoking Status Former  ?Smokeless Tobacco Former  ? ? ?Labs: ?Review Flowsheet   ? ?    ? View : No data to display.  ?  ?  ?  ?  ?  ? ? ? ?Exercise Target Goals: ?Exercise Program Goal: ?Individual exercise prescription set using results from initial 6 min walk test and THRR while considering  patient?s activity barriers and safety.  ? ?Exercise Prescription Goal: ?Initial exercise prescription builds to 30-45 minutes a day of aerobic activity, 2-3 days per week.  Home exercise guidelines will be given to patient during program as part of exercise prescription that the participant will acknowledge. ? ? ?Education: Aerobic Exercise: ?- Group verbal and visual presentation on the components of exercise prescription. Introduces F.I.T.T principle from ACSM for exercise prescriptions.  Reviews F.I.T.T. principles of aerobic exercise including progression. Written material given at graduation. ?Flowsheet Row Cardiac Rehab from 10/12/2021 in [facility] Cardiac and Pulmonary Rehab  ?Date 09/28/21  ?Educator [facility]  ?Instruction Review Code 1- Verbalizes Understanding  ? ?  ? ? ?Education: Resistance Exercise: ?- Group verbal and visual presentation on the components of exercise prescription. Introduces F.I.T.T principle from ACSM for exercise prescriptions  Reviews F.I.T.T. principles of resistance exercise including progression. Written material given at graduation. ?Flowsheet Row Cardiac Rehab from 10/12/2021 in [facility] Cardiac and Pulmonary Rehab  ?Date 10/05/21  ?Educator [facility]  ?Instruction Review Code 1- Verbalizes Understanding  ? ?  ? ?  ?Education: Exercise & Equipment Safety: ?- Individual verbal instruction and demonstration of equipment use and safety with use of the equipment. ?Flowsheet Row Cardiac Rehab from 10/12/2021 in  [facility] Cardiac and Pulmonary Rehab  ?Date 08/08/21  ?Educator [place]  ?Instruction Review Code 1- Verbalizes Understanding  ? ?  ? ? ?Education: Exercise Physiology & General Exercise Guidelines: ?- Group verbal and written instruction with models to review the exercise physiology of the cardiovascular system and associated critical values. Provides general exercise guidelines with specific guidelines to those with heart or lung disease.  ?Flowsheet Row Cardiac Rehab from 10/12/2021 in [facility] Cardiac and Pulmonary Rehab  ?Date 09/21/21  ?Educator [facility]  ?Instruction Review Code 1- Verbalizes Understanding  ? ?  ? ? ?Education: Flexibility, Balance, Mind/Body Relaxation: ?- Group verbal and visual presentation with interactive activity on the components of exercise prescription. Introduces F.I.T.T principle from  ACSM for exercise prescriptions. Reviews F.I.T.T. principles of flexibility and balance exercise training including progression. Also discusses the mind body connection.  Reviews various relaxation techniques to help reduce and manage stress (i.e. Deep breathing, progressive muscle relaxation, and visualization). Balance handout provided to take home. Written material given at graduation. ?Flowsheet Row Cardiac Rehab from 12/29/2020 in [facility] Cardiac and Pulmonary Rehab  ?Date 12/01/20  ?Educator [facility]  ?Instruction Review Code 1- Verbalizes Understanding  ? ?  ? ? ?Activity Barriers & Risk Stratification: ? Activity Barriers & Cardiac Risk Stratification - 07/22/21 1302   ? ?  ? Activity Barriers & Cardiac Risk Stratification  ? Activity Barriers Shortness of Breath;Joint Problems   ? Cardiac Risk Stratification High   ? ?  ?  ? ?  ? ? ?6 Minute Walk: ? 6 Minute Walk   ? ? [place] Name 08/08/21 1557  ?  ?  ?  ? 6 Minute Walk  ? Phase Initial    ? Distance 1420 feet    ? Walk Time 6 minutes    ? # of Rest Breaks 0    ? MPH 2.69    ? METS 3.46    ? RPE 9    ? Perceived Dyspnea  2    ? VO2 Peak 12.11    ? Symptoms No    ? Resting HR  67 bpm    ? Resting BP 92/60    ? Resting Oxygen Saturation  98 %    ? Exercise Oxygen Saturation  during 6 min walk 95 %    ? Max Ex. HR 97 bpm    ? Max Ex. BP 112/62    ? 2 Minute Post BP 104/60    ?

## 2021-10-14 ENCOUNTER — Encounter: Payer: 59 | Admitting: *Deleted

## 2021-10-14 VITALS — Ht 68.5 in | Wt 172.5 lb

## 2021-10-14 DIAGNOSIS — Z951 Presence of aortocoronary bypass graft: Secondary | ICD-10-CM

## 2021-10-14 NOTE — Progress Notes (Signed)
Daily Session Note  Patient Details  Name: Frank Wong MRN: [id number] Date of Birth: 1955/10/31 Referring Provider:   Flowsheet Row Cardiac Rehab from 08/08/2021 in [facility] Cardiac and Pulmonary Rehab  Referring Provider Nehemiah Massed       Encounter Date: 10/14/2021  Check In:  Session Check In - 10/14/21 0808       Check-In   Supervising physician immediately available to respond to emergencies See telemetry face sheet for immediately available ER MD    Location [facility]-Cardiac & Pulmonary Rehab    Staff Present Heath Lark, RN, BSN, CCRP;Jessica Becenti, MA, RCEP, CCRP, CCET;Joseph Vona, [place]    Virtual Visit No    Medication changes reported     No    Fall or balance concerns reported    No    Warm-up and Cool-down Performed on first and last piece of equipment    Resistance Training Performed Yes    VAD Patient? No    PAD/SET Patient? No      Pain Assessment   Currently in Pain? No/denies                Social History   Tobacco Use  Smoking Status Former  Smokeless Tobacco Former    Goals Met:  Independence with exercise equipment Exercise tolerated well No report of concerns or symptoms today  Goals Unmet:  Not Applicable  Comments: Pt able to follow exercise prescription today without complaint.  Will continue to monitor for progression.    Dr. Emily Filbert is Medical Director for [place].  Dr. Ottie Glazier is Medical Director for [facility] Pulmonary Rehabilitation.

## 2021-10-17 ENCOUNTER — Encounter: Payer: 59 | Admitting: *Deleted

## 2021-10-17 DIAGNOSIS — Z951 Presence of aortocoronary bypass graft: Secondary | ICD-10-CM

## 2021-10-17 NOTE — Progress Notes (Signed)
Daily Session Note  Patient Details  Name: Frank Wong MRN: [id number] Date of Birth: 1956/05/15 Referring Provider:   Flowsheet Row Cardiac Rehab from 08/08/2021 in [facility] Cardiac and Pulmonary Rehab  Referring Provider Nehemiah Massed       Encounter Date: 10/17/2021  Check In:  Session Check In - 10/17/21 0449       Check-In   Supervising physician immediately available to respond to emergencies See telemetry face sheet for immediately available ER MD    Location [facility]-Cardiac & Pulmonary Rehab    Staff Present Heath Lark, RN, BSN, CCRP;Joseph Woodlyn, RCP,RRT,BSRT;Kelly Cochiti, [place], ACSM CEP, Exercise Physiologist    Virtual Visit No    Medication changes reported     No    Fall or balance concerns reported    No    Warm-up and Cool-down Performed on first and last piece of equipment    Resistance Training Performed Yes    VAD Patient? No    PAD/SET Patient? No      Pain Assessment   Currently in Pain? No/denies                Social History   Tobacco Use  Smoking Status Former  Smokeless Tobacco Former    Goals Met:  Independence with exercise equipment Exercise tolerated well No report of concerns or symptoms today  Goals Unmet:  Not Applicable  Comments: Pt able to follow exercise prescription today without complaint.  Will continue to monitor for progression.    Dr. Emily Filbert is Medical Director for [place].  Dr. Ottie Glazier is Medical Director for [facility] Pulmonary Rehabilitation.

## 2021-10-19 DIAGNOSIS — Z951 Presence of aortocoronary bypass graft: Secondary | ICD-10-CM

## 2021-10-19 NOTE — Progress Notes (Signed)
Daily Session Note  Patient Details  Name: Frank Wong MRN: [id number] Date of Birth: Apr 18, 1956 Referring Provider:   Flowsheet Row Cardiac Rehab from 08/08/2021 in [facility] Cardiac and Pulmonary Rehab  Referring Provider Nehemiah Massed       Encounter Date: 10/19/2021  Check In:  Session Check In - 10/19/21 0713       Check-In   Supervising physician immediately available to respond to emergencies See telemetry face sheet for immediately available ER MD    Location [facility]-Cardiac & Pulmonary Rehab    Staff Present Birdie Sons, MPA, RN;Jessica Luan Pulling, MA, RCEP, CCRP, CCET;Joseph Trent, [place]    Virtual Visit No    Medication changes reported     No    Fall or balance concerns reported    No    Warm-up and Cool-down Performed on first and last piece of equipment    Resistance Training Performed Yes    VAD Patient? No    PAD/SET Patient? No      Pain Assessment   Currently in Pain? No/denies                Social History   Tobacco Use  Smoking Status Former  Smokeless Tobacco Former    Goals Met:  Independence with exercise equipment Exercise tolerated well No report of concerns or symptoms today Strength training completed today  Goals Unmet:  Not Applicable  Comments: Pt able to follow exercise prescription today without complaint.  Will continue to monitor for progression.    Dr. Emily Filbert is Medical Director for [place].  Dr. Ottie Glazier is Medical Director for [facility] Pulmonary Rehabilitation.

## 2021-10-20 NOTE — Patient Instructions (Signed)
Discharge Patient Instructions  Patient Details  Name: Frank Wong MRN: [id number] Date of Birth: [birth year] Referring Provider:  Gracelyn Nurse, MD   Number of Visits: 36  Reason for Discharge:  Patient reached a stable level of exercise. Patient independent in their exercise. Patient has met program and personal goals.  Smoking History:  Social History   Tobacco Use  Smoking Status Former  Smokeless Tobacco Former    Diagnosis:  S/P CABG x 2  Initial Exercise Prescription:  Initial Exercise Prescription - 08/08/21 1500       Date of Initial Exercise RX and Referring Provider   Date 08/08/21    Referring Provider Gwen Pounds      Oxygen   Maintain Oxygen Saturation 88% or higher      Treadmill   MPH 3    Grade 0.5    Minutes 15    METs 3.5      Recumbant Bike   Level 1    RPM 60    Minutes 15    METs 3.46      NuStep   Level 2    SPM 80    Minutes 15    METs 3.46      REL-XR   Level 2    Speed 50    Minutes 15    METs 3.46      Biostep-RELP   Level 1    SPM 50    Minutes 15    METs 3.46      Track   Laps 37    Minutes 15    METs 3.01      Prescription Details   Frequency (times per week) 3    Duration Progress to 30 minutes of continuous aerobic without signs/symptoms of physical distress      Intensity   THRR 40-80% of Max Heartrate 103-139    Ratings of Perceived Exertion 11-13    Perceived Dyspnea 0-4      Progression   Progression Continue to progress workloads to maintain intensity without signs/symptoms of physical distress.      Resistance Training   Training Prescription Yes    Weight 3    Reps 10-15             Discharge Exercise Prescription (Final Exercise Prescription Changes):  Exercise Prescription Changes - 10/10/21 1200       Response to Exercise   Blood Pressure (Admit) 108/60    Blood Pressure (Exit) 122/60    Heart Rate (Admit) 86 bpm    Heart Rate (Exercise) 129 bpm    Heart Rate (Exit) 116  bpm    Rating of Perceived Exertion (Exercise) 15    Symptoms none    Duration Continue with 30 min of aerobic exercise without signs/symptoms of physical distress.    Intensity THRR unchanged      Progression   Progression Continue to progress workloads to maintain intensity without signs/symptoms of physical distress.    Average METs 6.54      Resistance Training   Training Prescription Yes    Weight 5 lb    Reps 10-15      Interval Training   Interval Training Yes    Equipment Treadmill;T5 Nustep;REL-XR    Comments 3.4-3.8/6-10%; L4-8      Treadmill   MPH 4   intervals   Grade 9    Minutes 15    METs 9.8      Recumbant Bike   Level 9  Minutes 15    METs 3.73      NuStep   Level 6    Minutes 15      Elliptical   Level 5    Speed 3.7    Minutes 15      REL-XR   Level 10   intervals 7-10   Minutes 15    METs 7.8      T5 Nustep   Level 8   intervals   Minutes 15      Home Exercise Plan   Plans to continue exercise at Home (comment)   walking, biking   Frequency Add 2 additional days to program exercise sessions.    Initial Home Exercises Provided 08/31/21      Oxygen   Maintain Oxygen Saturation 88% or higher             Functional Capacity:  6 Minute Walk     Row Name 08/08/21 1557 10/14/21 0741       6 Minute Walk   Phase Initial Discharge    Distance 1420 feet 1850 feet    Distance % Change -- 30.3 %    Distance Feet Change -- 430 ft    Walk Time 6 minutes 6 minutes    # of Rest Breaks 0 0    MPH 2.69 3.5    METS 3.46 4.53    RPE 9 12    Perceived Dyspnea  2 --    VO2 Peak 12.11 15.86    Symptoms No Yes (comment)    Comments -- hip pain 2/10    Resting HR 67 bpm 86 bpm    Resting BP 92/60 108/64    Resting Oxygen Saturation  98 % 95 %    Exercise Oxygen Saturation  during 6 min walk 95 % 91 %    Max Ex. HR 97 bpm 122 bpm    Max Ex. BP 112/62 126/74    2 Minute Post BP 104/60 --              Nutrition & Weight -  Outcomes:  Pre Biometrics - 08/08/21 1607       Pre Biometrics   Height 5' 8.5" (1.74 m)    Weight 171 lb 1.6 oz (77.6 kg)    BMI (Calculated) 25.63    Single Leg Stand 29.91 seconds             Post Biometrics - 10/14/21 0742        Post  Biometrics   Height 5' 8.5" (1.74 m)    Weight 172 lb 8 oz (78.2 kg)    BMI (Calculated) 25.84    Single Leg Stand 30 seconds             Nutrition:  Nutrition Therapy & Goals - 08/31/21 0747       Nutrition Therapy   RD appointment deferred Yes      Intervention Plan   Intervention Prescribe, educate and counsel regarding individualized specific dietary modifications aiming towards targeted core components such as weight, hypertension, lipid management, diabetes, heart failure and other comorbidities.    Expected Outcomes Short Term Goal: Understand basic principles of dietary content, such as calories, fat, sodium, cholesterol and nutrients.              Goals reviewed with patient; copy given to patient.

## 2021-10-21 ENCOUNTER — Encounter: Payer: 59 | Admitting: *Deleted

## 2021-10-21 DIAGNOSIS — Z951 Presence of aortocoronary bypass graft: Secondary | ICD-10-CM

## 2021-10-21 NOTE — Progress Notes (Signed)
Daily Session Note  Patient Details  Name: Frank Wong MRN: [id number] Date of Birth: 10/19/1955 Referring Provider:   Flowsheet Row Cardiac Rehab from 08/08/2021 in [facility] Cardiac and Pulmonary Rehab  Referring Provider Nehemiah Massed       Encounter Date: 10/21/2021  Check In:  Session Check In - 10/21/21 0808       Check-In   Supervising physician immediately available to respond to emergencies See telemetry face sheet for immediately available ER MD    Location [facility]-Cardiac & Pulmonary Rehab    Staff Present Heath Lark, RN, BSN, CCRP;Jessica Mingo, MA, RCEP, CCRP, CCET;Joseph [place], [place]    Virtual Visit No    Medication changes reported     No    Fall or balance concerns reported    No    Warm-up and Cool-down Performed on first and last piece of equipment    Resistance Training Performed Yes    VAD Patient? No    PAD/SET Patient? No      Pain Assessment   Currently in Pain? No/denies                Social History   Tobacco Use  Smoking Status Former  Smokeless Tobacco Former    Goals Met:  Independence with exercise equipment Exercise tolerated well No report of concerns or symptoms today  Goals Unmet:  Not Applicable  Comments: Pt able to follow exercise prescription today without complaint.  Will continue to monitor for progression.    Dr. Emily Filbert is Medical Director for [place].  Dr. Ottie Glazier is Medical Director for [facility] Pulmonary Rehabilitation.

## 2021-10-26 DIAGNOSIS — Z951 Presence of aortocoronary bypass graft: Secondary | ICD-10-CM | POA: Diagnosis not present

## 2021-10-26 NOTE — Progress Notes (Signed)
Daily Session Note  Patient Details  Name: VERSHAWN WESTRUP MRN: [id number] Date of Birth: 1956-03-05 Referring Provider:   Flowsheet Row Cardiac Rehab from 08/08/2021 in [facility] Cardiac and Pulmonary Rehab  Referring Provider Nehemiah Massed       Encounter Date: 10/26/2021  Check In:  Session Check In - 10/26/21 0727       Check-In   Supervising physician immediately available to respond to emergencies See telemetry face sheet for immediately available ER MD    Location [facility]-Cardiac & Pulmonary Rehab    Staff Present Birdie Sons, MPA, RN;Jessica Luan Pulling, MA, RCEP, CCRP, CCET;Joseph [place], [place]    Virtual Visit No    Medication changes reported     No    Fall or balance concerns reported    No    Warm-up and Cool-down Performed on first and last piece of equipment    Resistance Training Performed Yes    VAD Patient? No    PAD/SET Patient? No      Pain Assessment   Currently in Pain? No/denies                Social History   Tobacco Use  Smoking Status Former  Smokeless Tobacco Former    Goals Met:  Independence with exercise equipment Exercise tolerated well No report of concerns or symptoms today Strength training completed today  Goals Unmet:  Not Applicable  Comments:  Kasem graduated today from  rehab with 36 sessions completed.  Details of the patient's exercise prescription and what He needs to do in order to continue the prescription and progress were discussed with patient.  Patient was given a copy of prescription and goals.  Patient verbalized understanding.  Kwamane plans to continue to exercise by walking and riding his bike at home.    Dr. Emily Filbert is Medical Director for [place].  Dr. Ottie Glazier is Medical Director for [facility] Pulmonary Rehabilitation.

## 2021-10-26 NOTE — Progress Notes (Signed)
Cardiac Individual Treatment Plan  Patient Details  Name: KYLYN SOOKRAM MRN: [id number] Date of Birth: November 30, 1955 Referring Provider:   Flowsheet Row Cardiac Rehab from 08/08/2021 in [facility] Cardiac and Pulmonary Rehab  Referring Provider Nehemiah Massed       Initial Encounter Date:  Flowsheet Row Cardiac Rehab from 08/08/2021 in [facility] Cardiac and Pulmonary Rehab  Date 08/08/21       Visit Diagnosis: S/P CABG x 2  Patient's Home Medications on Admission:  Current Outpatient Medications:    amLODipine (NORVASC) 2.5 MG tablet, Take 2.5 mg by mouth daily. (Patient not taking: Reported on 07/22/2021), Disp: , Rfl:    amphetamine-dextroamphetamine (ADDERALL XR) 10 MG 24 hr capsule, Take 10 mg by mouth daily., Disp: , Rfl:    aspirin EC 81 MG tablet, Take 81 mg by mouth in the morning., Disp: , Rfl:    atorvastatin (LIPITOR) 80 MG tablet, Take 80 mg by mouth in the morning., Disp: , Rfl:    B Complex-C (B-COMPLEX WITH VITAMIN C) tablet, Take 1 tablet by mouth in the morning., Disp: , Rfl:    benazepril (LOTENSIN) 20 MG tablet, Take 20 mg by mouth in the morning. (Patient not taking: Reported on 07/22/2021), Disp: , Rfl:    clonazePAM (KLONOPIN) 2 MG tablet, Take 1 mg by mouth at bedtime as needed (sleep/ anxiety). (Patient not taking: Reported on 07/22/2021), Disp: , Rfl:    empagliflozin (JARDIANCE) 10 MG TABS tablet, Take 10 mg by mouth daily., Disp: , Rfl:    ferrous sulfate 325 (65 FE) MG tablet, Take 325 mg by mouth in the morning., Disp: , Rfl:    fexofenadine (ALLEGRA) 180 MG tablet, Take 180 mg by mouth in the morning., Disp: , Rfl:    Fluticasone-Umeclidin-Vilant 100-62.5-25 MCG/ACT AEPB, Inhale 1 puff into the lungs daily., Disp: , Rfl:    gabapentin (NEURONTIN) 600 MG tablet, Take 600 mg by mouth at bedtime., Disp: , Rfl:    glipiZIDE (GLUCOTROL) 10 MG tablet, Take 10 mg by mouth every morning., Disp: , Rfl:    JARDIANCE 25 MG TABS tablet, Take 25 mg by mouth in the morning., Disp: , Rfl:     levocetirizine (XYZAL) 5 MG tablet, Take 5 mg by mouth every evening., Disp: , Rfl:    Lifitegrast (XIIDRA) 5 % SOLN, Place 1 drop into both eyes daily., Disp: , Rfl:    magnesium oxide (MAG-OX) 400 MG tablet, Take 400 mg by mouth in the morning., Disp: , Rfl:    melatonin 5 MG TABS, Take 5 mg by mouth at bedtime as needed (sleep). (Patient not taking: Reported on 06/27/2021), Disp: , Rfl:    metFORMIN (GLUCOPHAGE) 1000 MG tablet, Take 1,000 mg by mouth 2 (two) times daily with a meal. , Disp: , Rfl:    montelukast (SINGULAIR) 10 MG tablet, Take 10 mg by mouth at bedtime., Disp: , Rfl:    Multiple Vitamin (MULTIVITAMIN WITH MINERALS) TABS tablet, Take 1 tablet by mouth in the morning., Disp: , Rfl:    pantoprazole (PROTONIX) 40 MG tablet, Take 40 mg by mouth at bedtime., Disp: , Rfl:    pimecrolimus (ELIDEL) 1 % cream, Apply topically 2 (two) times daily. Use for 2 weeks at affected areas of eyelids. Can also use at scrotum once daily., Disp: 60 g, Rfl: 11   Semaglutide,0.25 or 0.5MG /DOS, 2 MG/3ML SOPN, Inject 0.25 mg weekly for the first 4 weeks, then increase to 0.5 mg weekly., Disp: , Rfl:    sertraline (ZOLOFT)  25 MG tablet, Take 25 mg by mouth daily., Disp: , Rfl:    Testosterone 20.25 MG/ACT (1.62%) GEL, Apply 2 Pump topically in the morning. Applied to shoulders., Disp: , Rfl:    triamcinolone cream (KENALOG) 0.1 %, Apply 1 application topically 2 (two) times daily as needed (skin irritation/itching). (Patient not taking: Reported on 06/27/2021), Disp: , Rfl:   Past Medical History: Past Medical History:  Diagnosis Date   Alcoholism ([place])    Asthma    CAD (coronary artery disease)    Cardiac arrest ([place])    Chronic cardiac arrhythmia    Chronic kidney disease    COPD (chronic obstructive pulmonary disease) ([place])    Depression    Diabetes mellitus without complication (HCC)    Essential tremor    GERD (gastroesophageal reflux disease)    Heart attack ([place])    Heart murmur     Hyperlipidemia    Hypertension    IBS (irritable bowel syndrome)    Shoulder problem 2010   right   Sinoatrial node dysfunction (HCC)    Sleep apnea     Tobacco Use: Social History   Tobacco Use  Smoking Status Former  Smokeless Tobacco Former    Labs: Medical illustrator : No data to display.               Exercise Target Goals: Exercise Program Goal: Individual exercise prescription set using results from initial 6 min walk test and THRR while considering  patient's activity barriers and safety.   Exercise Prescription Goal: Initial exercise prescription builds to 30-45 minutes a day of aerobic activity, 2-3 days per week.  Home exercise guidelines will be given to patient during program as part of exercise prescription that the participant will acknowledge.   Education: Aerobic Exercise: - Group verbal and visual presentation on the components of exercise prescription. Introduces F.I.T.T principle from ACSM for exercise prescriptions.  Reviews F.I.T.T. principles of aerobic exercise including progression. Written material given at graduation. Flowsheet Row Cardiac Rehab from 10/19/2021 in [facility] Cardiac and Pulmonary Rehab  Date 09/28/21  Educator [facility]  Instruction Review Code 1- Verbalizes Understanding       Education: Resistance Exercise: - Group verbal and visual presentation on the components of exercise prescription. Introduces F.I.T.T principle from ACSM for exercise prescriptions  Reviews F.I.T.T. principles of resistance exercise including progression. Written material given at graduation. Flowsheet Row Cardiac Rehab from 10/19/2021 in [facility] Cardiac and Pulmonary Rehab  Date 10/05/21  Educator [facility]  Instruction Review Code 1- Verbalizes Understanding        Education: Exercise & Equipment Safety: - Individual verbal instruction and demonstration of equipment use and safety with use of the equipment. Flowsheet Row Cardiac Rehab from 10/19/2021 in  [facility] Cardiac and Pulmonary Rehab  Date 08/08/21  Educator [facility]  Instruction Review Code 1- Verbalizes Understanding       Education: Exercise Physiology & General Exercise Guidelines: - Group verbal and written instruction with models to review the exercise physiology of the cardiovascular system and associated critical values. Provides general exercise guidelines with specific guidelines to those with heart or lung disease.  Flowsheet Row Cardiac Rehab from 10/19/2021 in [facility] Cardiac and Pulmonary Rehab  Date 09/21/21  Educator [facility]  Instruction Review Code 1- Verbalizes Understanding       Education: Flexibility, Balance, Mind/Body Relaxation: - Group verbal and visual presentation with interactive activity on the components of exercise prescription. Introduces F.I.T.T principle from  ACSM for exercise prescriptions. Reviews F.I.T.T. principles of flexibility and balance exercise training including progression. Also discusses the mind body connection.  Reviews various relaxation techniques to help reduce and manage stress (i.e. Deep breathing, progressive muscle relaxation, and visualization). Balance handout provided to take home. Written material given at graduation. Flowsheet Row Cardiac Rehab from 12/29/2020 in [facility] Cardiac and Pulmonary Rehab  Date 12/01/20  Educator [facility]  Instruction Review Code 1- Verbalizes Understanding       Activity Barriers & Risk Stratification:  Activity Barriers & Cardiac Risk Stratification - 07/22/21 1302       Activity Barriers & Cardiac Risk Stratification   Activity Barriers Shortness of Breath;Joint Problems    Cardiac Risk Stratification High             6 Minute Walk:  6 Minute Walk     Row Name 08/08/21 1557 10/14/21 0741       6 Minute Walk   Phase Initial Discharge    Distance 1420 feet 1850 feet    Distance % Change -- 30.3 %    Distance Feet Change -- 430 ft    Walk Time 6 minutes 6 minutes    # of Rest Breaks 0 0    MPH 2.69  3.5    METS 3.46 4.53    RPE 9 12    Perceived Dyspnea  2 --    VO2 Peak 12.11 15.86    Symptoms No Yes (comment)    Comments -- hip pain 2/10    Resting HR 67 bpm 86 bpm    Resting BP 92/60 108/64    Resting Oxygen Saturation  98 % 95 %    Exercise Oxygen Saturation  during 6 min walk 95 % 91 %    Max Ex. HR 97 bpm 122 bpm    Max Ex. BP 112/62 126/74    2 Minute Post BP 104/60 --             Oxygen Initial Assessment:   Oxygen Re-Evaluation:   Oxygen Discharge (Final Oxygen Re-Evaluation):   Initial Exercise Prescription:  Initial Exercise Prescription - 08/08/21 1500       Date of Initial Exercise RX and Referring Provider   Date 08/08/21    Referring Provider Nehemiah Massed      Oxygen   Maintain Oxygen Saturation 88% or higher      Treadmill   MPH 3    Grade 0.5    Minutes 15    METs 3.5      Recumbant Bike   Level 1    RPM 60    Minutes 15    METs 3.46      NuStep   Level 2    SPM 80    Minutes 15    METs 3.46      REL-XR   Level 2    Speed 50    Minutes 15    METs 3.46      Biostep-RELP   Level 1    SPM 50    Minutes 15    METs 3.46      Track   Laps 37    Minutes 15    METs 3.01      Prescription Details   Frequency (times per week) 3    Duration Progress to 30 minutes of continuous aerobic without signs/symptoms of physical distress      Intensity   THRR 40-80% of Max Heartrate 103-139    Ratings of  Perceived Exertion 11-13    Perceived Dyspnea 0-4      Progression   Progression Continue to progress workloads to maintain intensity without signs/symptoms of physical distress.      Resistance Training   Training Prescription Yes    Weight 3    Reps 10-15             Perform Capillary Blood Glucose checks as needed.  Exercise Prescription Changes:   Exercise Prescription Changes     Row Name 08/08/21 1600 08/29/21 1300 08/31/21 0700 09/13/21 1300 09/28/21 1000     Response to Exercise   Blood Pressure (Admit)  92/60 112/72 -- 102/64 122/70   Blood Pressure (Exercise) 112/62 124/64 -- 118/66 --   Blood Pressure (Exit) 104/60 98/62 -- 102/62 124/70   Heart Rate (Admit) 67 bpm 92 bpm -- 93 bpm 94 bpm   Heart Rate (Exercise) 97 bpm 133 bpm -- 127 bpm 142 bpm   Heart Rate (Exit) 90 bpm 90 bpm -- 103 bpm 117 bpm   Oxygen Saturation (Admit) 98 % -- -- -- 95 %   Oxygen Saturation (Exercise) 95 % -- -- -- 94 %   Oxygen Saturation (Exit) 97 % -- -- -- 93 %   Rating of Perceived Exertion (Exercise) 9 14 -- 13 13   Perceived Dyspnea (Exercise) 2 -- -- -- --   Symptoms none none -- none none   Comments 6 min walk test results -- -- -- --   Duration -- Continue with 30 min of aerobic exercise without signs/symptoms of physical distress. -- Continue with 30 min of aerobic exercise without signs/symptoms of physical distress. Continue with 30 min of aerobic exercise without signs/symptoms of physical distress.   Intensity -- THRR unchanged -- THRR unchanged THRR unchanged     Progression   Progression -- Continue to progress workloads to maintain intensity without signs/symptoms of physical distress. -- Continue to progress workloads to maintain intensity without signs/symptoms of physical distress. Continue to progress workloads to maintain intensity without signs/symptoms of physical distress.   Average METs -- 4.56 -- 4.83 6.73     Resistance Training   Training Prescription Yes Yes -- Yes Yes   Weight 3 4 lb -- 4 lb 5 lb   Reps 10-15 10-15 -- 10-15 10-15     Interval Training   Interval Training -- No -- No No     Treadmill   MPH 3 3.1 -- -- 3.9   Grade 0.5 4.5 -- -- 8   Minutes 15 15 -- -- 15   METs 3.5 5.3 -- -- 9     Recumbant Bike   Level 1 -- -- -- --   RPM 60 -- -- -- --   Minutes 15 -- -- -- --   METs 3.46 -- -- -- --     NuStep   Level 2 4 -- -- 6   SPM 80 -- -- -- --   Minutes 15 15 -- -- 15   METs 3.46 4 -- -- 4.3     Elliptical   Level -- -- -- -- 4   Speed -- -- -- -- 3.7    Minutes -- -- -- -- 15     REL-XR   Level 2 6 -- 8 6   Speed 50 -- -- -- --   Minutes 15 15 -- 15 15   METs 3.46 6.3 -- 6.5 6.9     Biostep-RELP   Level 1 -- -- -- --  SPM 50 -- -- -- --   Minutes 15 -- -- -- --   METs 3.46 -- -- -- --     Track   Laps 37 30 -- 40 --   Minutes 15 15 -- 15 --   METs 3.01 2.63 -- 3.18 --     Home Exercise Plan   Plans to continue exercise at -- -- Home (comment)  walking, biking Home (comment)  walking, biking Home (comment)  walking, biking   Frequency -- -- Add 2 additional days to program exercise sessions. Add 2 additional days to program exercise sessions. Add 2 additional days to program exercise sessions.   Initial Home Exercises Provided -- -- 08/31/21 08/31/21 08/31/21     Oxygen   Maintain Oxygen Saturation -- 88% or higher -- -- 88% or higher    Row Name 10/10/21 1200             Response to Exercise   Blood Pressure (Admit) 108/60       Blood Pressure (Exit) 122/60       Heart Rate (Admit) 86 bpm       Heart Rate (Exercise) 129 bpm       Heart Rate (Exit) 116 bpm       Rating of Perceived Exertion (Exercise) 15       Symptoms none       Duration Continue with 30 min of aerobic exercise without signs/symptoms of physical distress.       Intensity THRR unchanged         Progression   Progression Continue to progress workloads to maintain intensity without signs/symptoms of physical distress.       Average METs 6.54         Resistance Training   Training Prescription Yes       Weight 5 lb       Reps 10-15         Interval Training   Interval Training Yes       Equipment Treadmill;T5 Nustep;REL-XR       Comments 3.4-3.8/6-10%; L4-8         Treadmill   MPH 4  intervals       Grade 9       Minutes 15       METs 9.8         Recumbant Bike   Level 9       Minutes 15       METs 3.73         NuStep   Level 6       Minutes 15         Elliptical   Level 5       Speed 3.7       Minutes 15         REL-XR    Level 10  intervals 7-10       Minutes 15       METs 7.8         T5 Nustep   Level 8  intervals       Minutes 15         Home Exercise Plan   Plans to continue exercise at Home (comment)  walking, biking       Frequency Add 2 additional days to program exercise sessions.       Initial Home Exercises Provided 08/31/21         Oxygen   Maintain Oxygen Saturation 88% or higher  Exercise Comments:   Exercise Comments     Row Name 08/10/21 0732 10/26/21 2010         Exercise Comments First full day of exercise!  Patient was oriented to gym and equipment including functions, settings, policies, and procedures.  Patient's individual exercise prescription and treatment plan were reviewed.  All starting workloads were established based on the results of the 6 minute walk test done at initial orientation visit.  The plan for exercise progression was also introduced and progression will be customized based on patient's performance and goals. Izsak graduated today from  rehab with 36 sessions completed.  Details of the patient's exercise prescription and what He needs to do in order to continue the prescription and progress were discussed with patient.  Patient was given a copy of prescription and goals.  Patient verbalized understanding.  Ahmon plans to continue to exercise by walking and riding his bike at home.               Exercise Goals and Review:   Exercise Goals     Row Name 08/08/21 1606             Exercise Goals   Increase Physical Activity Yes       Intervention Provide advice, education, support and counseling about physical activity/exercise needs.;Develop an individualized exercise prescription for aerobic and resistive training based on initial evaluation findings, risk stratification, comorbidities and participant's personal goals.       Expected Outcomes Long Term: Add in home exercise to make exercise part of routine and to increase amount of  physical activity.;Short Term: Attend rehab on a regular basis to increase amount of physical activity.;Long Term: Exercising regularly at least 3-5 days a week.       Able to understand and use rate of perceived exertion (RPE) scale Yes       Intervention Provide education and explanation on how to use RPE scale       Expected Outcomes Long Term:  Able to use RPE to guide intensity level when exercising independently;Short Term: Able to use RPE daily in rehab to express subjective intensity level       Able to understand and use Dyspnea scale Yes       Expected Outcomes Short Term: Able to use Dyspnea scale daily in rehab to express subjective sense of shortness of breath during exertion;Long Term: Able to use Dyspnea scale to guide intensity level when exercising independently       Knowledge and understanding of Target Heart Rate Range (THRR) Yes       Intervention Provide education and explanation of THRR including how the numbers were predicted and where they are located for reference       Expected Outcomes Short Term: Able to state/look up THRR;Long Term: Able to use THRR to govern intensity when exercising independently;Short Term: Able to use daily as guideline for intensity in rehab       Able to check pulse independently Yes       Intervention Provide education and demonstration on how to check pulse in carotid and radial arteries.;Review the importance of being able to check your own pulse for safety during independent exercise       Expected Outcomes Short Term: Able to explain why pulse checking is important during independent exercise;Long Term: Able to check pulse independently and accurately       Understanding of Exercise Prescription Yes       Intervention Provide education, explanation,  and written materials on patient's individual exercise prescription       Expected Outcomes Short Term: Able to explain program exercise prescription;Long Term: Able to explain home exercise  prescription to exercise independently                Exercise Goals Re-Evaluation :  Exercise Goals Re-Evaluation     Row Name 08/10/21 0732 08/29/21 1341 08/31/21 0738 09/13/21 1304 09/28/21 0956     Exercise Goal Re-Evaluation   Exercise Goals Review Increase Physical Activity;Able to understand and use rate of perceived exertion (RPE) scale;Knowledge and understanding of Target Heart Rate Range (THRR);Understanding of Exercise Prescription;Able to understand and use Dyspnea scale;Increase Strength and Stamina;Able to check pulse independently Increase Physical Activity;Able to understand and use rate of perceived exertion (RPE) scale;Knowledge and understanding of Target Heart Rate Range (THRR);Understanding of Exercise Prescription;Able to understand and use Dyspnea scale;Increase Strength and Stamina;Able to check pulse independently Increase Physical Activity;Able to understand and use rate of perceived exertion (RPE) scale;Knowledge and understanding of Target Heart Rate Range (THRR);Understanding of Exercise Prescription;Able to understand and use Dyspnea scale;Increase Strength and Stamina;Able to check pulse independently Increase Physical Activity;Increase Strength and Stamina Increase Physical Activity;Increase Strength and Stamina;Understanding of Exercise Prescription   Comments Reviewed RPE and dyspnea scales, THR and program prescription with pt today.  Pt voiced understanding and was given a copy of goals to take home. Waverly is doing well in rehab.  He is up to 4.5% grade on the treadmill.  He is also up to level 6 on the XR.  We will continue to monitor his progress. Tod is doing well in rehab.  He is already walking some at home.  Reviewed home exercise with pt today.  Pt plans to walk and use weights at home for exercise.  Reviewed THR, pulse, RPE, sign and symptoms, pulse oximetery and when to call 911 or MD.  Also discussed weather considerations and indoor options.  Pt voiced  understanding. Allon attends consistently and reaches THR range.  He is doing some intervals by changing incline on TM.  Staff will monitor progress. Nik is doing well in rehab.  He is doing intervals on the treadmill with his incline.  We will continue to monitir his progress.   Expected Outcomes Short: Use RPE daily to regulate intensity. Long: Follow program prescription in THR. Short: Aim for more laps on the track Long: Continue to improve stamina Short: Start to add in more exercise at home Long: Continue to improve stamina. Short: continue with intervals Long: continue to increase MET level Short: Try intervals on the elliptical Long: Continue to improve stamina    Row Name 10/10/21 1233 10/14/21 0824           Exercise Goal Re-Evaluation   Exercise Goals Review Increase Physical Activity;Increase Strength and Stamina;Understanding of Exercise Prescription Increase Physical Activity;Increase Strength and Stamina;Understanding of Exercise Prescription      Comments Chaim continues to do well in rehab. He is doing intervals with the treadmill, T5 Nustep, and XR. He is working up to level 10 on the XR and has worked up to 9 METS on the treadmill. He is reaching his THR. Will continue to monitor. Tayvin improved his post walk by 30%!!  He is feeling much better and closer to his normal again.  He is already staying active at the gym at work using treadmill, elliptical, and weights.  He has also been walking daily for 1.5 miles and plans to continue this  as well.      Expected Outcomes Short: Continue working with intervals on all machines Long: Continue to increase overall MET level short: Conitnue to work toward gradation Long: continue to exercise independently               Discharge Exercise Prescription (Final Exercise Prescription Changes):  Exercise Prescription Changes - 10/10/21 1200       Response to Exercise   Blood Pressure (Admit) 108/60    Blood Pressure (Exit) 122/60     Heart Rate (Admit) 86 bpm    Heart Rate (Exercise) 129 bpm    Heart Rate (Exit) 116 bpm    Rating of Perceived Exertion (Exercise) 15    Symptoms none    Duration Continue with 30 min of aerobic exercise without signs/symptoms of physical distress.    Intensity THRR unchanged      Progression   Progression Continue to progress workloads to maintain intensity without signs/symptoms of physical distress.    Average METs 6.54      Resistance Training   Training Prescription Yes    Weight 5 lb    Reps 10-15      Interval Training   Interval Training Yes    Equipment Treadmill;T5 Nustep;REL-XR    Comments 3.4-3.8/6-10%; L4-8      Treadmill   MPH 4   intervals   Grade 9    Minutes 15    METs 9.8      Recumbant Bike   Level 9    Minutes 15    METs 3.73      NuStep   Level 6    Minutes 15      Elliptical   Level 5    Speed 3.7    Minutes 15      REL-XR   Level 10   intervals 7-10   Minutes 15    METs 7.8      T5 Nustep   Level 8   intervals   Minutes 15      Home Exercise Plan   Plans to continue exercise at Home (comment)   walking, biking   Frequency Add 2 additional days to program exercise sessions.    Initial Home Exercises Provided 08/31/21      Oxygen   Maintain Oxygen Saturation 88% or higher             Nutrition:  Target Goals: Understanding of nutrition guidelines, daily intake of sodium '1500mg'$ , cholesterol '200mg'$ , calories 30% from fat and 7% or less from saturated fats, daily to have 5 or more servings of fruits and vegetables.  Education: All About Nutrition: -Group instruction provided by verbal, written material, interactive activities, discussions, models, and posters to present general guidelines for heart healthy nutrition including fat, fiber, MyPlate, the role of sodium in heart healthy nutrition, utilization of the nutrition label, and utilization of this knowledge for meal planning. Follow up email sent as well. Written material  given at graduation. Flowsheet Row Cardiac Rehab from 10/19/2021 in [facility] Cardiac and Pulmonary Rehab  Education need identified 08/08/21  Date 10/19/21  Educator [place]  Instruction Review Code 1- Verbalizes Understanding       Biometrics:  Pre Biometrics - 08/08/21 1607       Pre Biometrics   Height 5' 8.5" (1.74 m)    Weight 171 lb 1.6 oz (77.6 kg)    BMI (Calculated) 25.63    Single Leg Stand 29.91 seconds  Post Biometrics - 10/14/21 2409        Post  Biometrics   Height 5' 8.5" (1.74 m)    Weight 172 lb 8 oz (78.2 kg)    BMI (Calculated) 25.84    Single Leg Stand 30 seconds             Nutrition Therapy Plan and Nutrition Goals:  Nutrition Therapy & Goals - 08/31/21 0747       Nutrition Therapy   RD appointment deferred Yes      Intervention Plan   Intervention Prescribe, educate and counsel regarding individualized specific dietary modifications aiming towards targeted core components such as weight, hypertension, lipid management, diabetes, heart failure and other comorbidities.    Expected Outcomes Short Term Goal: Understand basic principles of dietary content, such as calories, fat, sodium, cholesterol and nutrients.             Nutrition Assessments:  MEDIFICTS Score Key: ?70 Need to make dietary changes  40-70 Heart Healthy Diet ? 40 Therapeutic Level Cholesterol Diet  Flowsheet Row Cardiac Rehab from 10/21/2021 in [facility] Cardiac and Pulmonary Rehab  Picture Your Plate Total Score on Admission 69  Picture Your Plate Total Score on Discharge 69      Picture Your Plate Scores: <73 Unhealthy dietary pattern with much room for improvement. 41-50 Dietary pattern unlikely to meet recommendations for good health and room for improvement. 51-60 More healthful dietary pattern, with some room for improvement.  >60 Healthy dietary pattern, although there may be some specific behaviors that could be improved.    Nutrition Goals  Re-Evaluation:  Nutrition Goals Re-Evaluation     Row Name 08/31/21 0741 09/30/21 0731 10/14/21 0828         Goals   Nutrition Goal Heart Healthy diet Lose a few more pounds. Short: continue heart healthy diet. Long: lose 5 more pounds.     Comment Xabi has declined meeting with dietitan this time. He is feeling good with his diet.  He is still aiming for lots of fruits and vegetables. He is sticking to lean meats and controlling his portion sizes. Nahmir is doing well with his diet and wants to lose another 5 pounds. He does not feel like he needs to meet with the dietitian. He does not eat fast food often wants sticks to his heart healthy diet. Khaliq continues to work on [employer] loss and is eating better.  He continues to decline appointment.  He is focused on heart healthy eating and avoiding fast food.  He does have some cheat days, but overall, healthy eating.     Expected Outcome Short: Continue to focus on portion control Long: Continue to eat heart healthy diet. Short: continue heart healthy diet. Long: lose 5 more pounds. Continue to focus on heart healthy diet              Nutrition Goals Discharge (Final Nutrition Goals Re-Evaluation):  Nutrition Goals Re-Evaluation - 10/14/21 5329       Goals   Nutrition Goal Short: continue heart healthy diet. Long: lose 5 more pounds.    Comment Koury continues to work on [employer] loss and is eating better.  He continues to decline appointment.  He is focused on heart healthy eating and avoiding fast food.  He does have some cheat days, but overall, healthy eating.    Expected Outcome Continue to focus on heart healthy diet             Psychosocial: Target Goals:  Acknowledge presence or absence of significant depression and/or stress, maximize coping skills, provide positive support system. Participant is able to verbalize types and ability to use techniques and skills needed for reducing stress and depression.   Education: Stress,  Anxiety, and Depression - Group verbal and visual presentation to define topics covered.  Reviews how body is impacted by stress, anxiety, and depression.  Also discusses healthy ways to reduce stress and to treat/manage anxiety and depression.  Written material given at graduation. Flowsheet Row Cardiac Rehab from 10/19/2021 in [facility] Cardiac and Pulmonary Rehab  Date 09/14/21  Educator [facility]  Instruction Review Code 1- [employer] Understanding       Education: Sleep Hygiene -Provides group verbal and written instruction about how sleep can affect your health.  Define sleep hygiene, discuss sleep cycles and impact of sleep habits. Review good sleep hygiene tips.    Initial Review & Psychosocial Screening:  Initial Psych Review & Screening - 07/22/21 1308       Initial Review   Current issues with History of Depression;Current Psychotropic Meds;Current Depression    Source of Stress Concerns Chronic Illness;Unable to participate in former interests or hobbies;Unable to perform yard/household activities      [place]? Yes   wife and children     Barriers   Psychosocial barriers to participate in program The patient should benefit from training in stress management and relaxation.;Psychosocial barriers identified (see note);There are no identifiable barriers or psychosocial needs.      Screening Interventions   Interventions To provide support and resources with identified psychosocial needs;Provide feedback about the scores to participant;Encouraged to exercise    Expected Outcomes Short Term goal: Utilizing psychosocial counselor, staff and physician to assist with identification of specific Stressors or current issues interfering with healing process. Setting desired goal for each stressor or current issue identified.;Long Term Goal: Stressors or current issues are controlled or eliminated.;Short Term goal: Identification and review with participant of any  Quality of Life or Depression concerns found by scoring the questionnaire.;Long Term goal: The participant improves quality of Life and PHQ9 Scores as seen by post scores and/or verbalization of changes             Quality of Life Scores:   Quality of Life - 10/21/21 0732       Quality of Life   Select Quality of Life      Quality of Life Scores   Health/Function Pre 23.03 %    Health/Function Post 22.37 %    Health/Function % Change -2.87 %    Socioeconomic Pre 25.31 %    Socioeconomic Post 21.64 %    Socioeconomic % Change  -14.5 %    Psych/Spiritual Pre 27.07 %    Psych/Spiritual Post 22.86 %    Psych/Spiritual % Change -15.55 %    Family Pre 30 %    Family Post 30 %    Family % Change 0 %    GLOBAL Pre 25.36 %    GLOBAL Post 23.44 %    GLOBAL % Change -7.57 %            Scores of 19 and below usually indicate a poorer quality of life in these areas.  A difference of  2-3 points is a clinically meaningful difference.  A difference of 2-3 points in the total score of the Quality of Life Index has been associated with significant improvement in overall quality of life, self-image, physical symptoms, and  general health in studies assessing change in quality of life.  PHQ-9: Review Flowsheet        10/21/2021 08/08/2021 01/05/2021 11/01/2020  Depression screen PHQ 2/9  Decreased Interest 1 0 0 0  Down, Depressed, Hopeless 1 0 1 0  PHQ - 2 Score 2 0 1 0  Altered sleeping 1 0 1 1  Tired, decreased energy 1 1 1 1   Change in appetite 1 1 0 0  Feeling bad or failure about yourself  0 0 0 0  Trouble concentrating 2 2 1 1   Moving slowly or fidgety/restless 2 0 0 0  Suicidal thoughts 0 0 0 0  PHQ-9 Score 9 4 4 3   Difficult doing work/chores Not difficult at all Somewhat difficult Not difficult at all Somewhat difficult         Interpretation of Total Score  Total Score Depression Severity:  1-4 = Minimal depression, 5-9 = Mild depression, 10-14 = Moderate depression,  15-19 = Moderately severe depression, 20-27 = Severe depression   Psychosocial Evaluation and Intervention:  Psychosocial Evaluation - 07/22/21 1313       Psychosocial Evaluation & Interventions   Interventions Encouraged to exercise with the program and follow exercise prescription;Stress management education    Comments Mariana is 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 He states recovery is going as expected and he is ready to get back to his usual activities. He has been diagnosed with emphysema since his last admission in cardiac rehab in mid 2022. He reports his breathing has been troublesome and he wants to work on that as well as gain back some muscle he has lost during recovery. His history of depression is well managed and he continues to have a great support system. He was happy to announce their first granddaughter was born recently and they are planning on traveling to meet her soon.    Expected Outcomes Short: attend cardiac rehab for education and exercise. Long: develop and maintain positive self care habits.    Continue Psychosocial Services  Follow up required by staff             Psychosocial Re-Evaluation:  Psychosocial Re-Evaluation     Row Name 08/31/21 0739 09/30/21 0730 10/14/21 8756         Psychosocial Re-Evaluation   Current issues with Current Stress Concerns None Identified None Identified     Comments Kenson is doing well in rehab.  He is feeling like he is in a good place mentally.  He denies any major stressors currently.  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  He will be released to return to work in May (9th) due to his sternum pain.  He is sleeping good for most part with his sleep aid. Patient reports no issues with their current mental states, sleep, stress, depression or anxiety. Will follow up with patient in a few weeks for any changes. [place] has done well in rehab.  He has enjoyed coming to  class and getting out with people again.  He likes how he feels when he is able to be active.  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     Expected Outcomes Short: Continue to build strength to return to work Long: continue to stay positive. Short: Continue to exercise regularly to support mental health  and notify staff of any changes. Long: maintain mental health and well being through teaching of rehab or prescribed medications independently. Continue to exercise for mental boost.     Interventions Encouraged to attend Cardiac Rehabilitation for the exercise Encouraged to attend Cardiac Rehabilitation for the exercise Encouraged to attend Cardiac Rehabilitation for the exercise     Continue Psychosocial Services  -- Follow up required by staff Follow up required by staff              Psychosocial Discharge (Final Psychosocial Re-Evaluation):  Psychosocial Re-Evaluation - 10/14/21 0826       Psychosocial Re-Evaluation   Current issues with None Identified    Comments Jordie has done well in rehab.  He has enjoyed coming to class and getting out with people again.  He likes how he feels when he is able to be active.  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    Expected Outcomes Continue to exercise for mental boost.    Interventions Encouraged to attend Cardiac Rehabilitation for the exercise    Continue Psychosocial Services  Follow up required by staff             Vocational Rehabilitation: Provide vocational rehab assistance to qualifying candidates.   Vocational Rehab Evaluation & Intervention:  Vocational Rehab - 10/21/21 0737       Initial Vocational Rehab Evaluation & Intervention   Assessment shows need for Vocational Rehabilitation No      Discharge Vocational Rehab   Discharge Vocational  Rehabilitation no request for VR             Education: Education Goals: Education classes will be provided on a variety of topics geared toward better understanding of heart health and risk factor modification. Participant will state understanding/return demonstration of topics presented as noted by education test scores.  Learning Barriers/Preferences:  Learning Barriers/Preferences - 07/22/21 1325       Learning Barriers/Preferences   Learning Barriers Sight   glasses   Learning Preferences None             General Cardiac Education Topics:  AED/CPR: - Group verbal and written instruction with the use of models to demonstrate the basic use of the AED with the basic ABC's of resuscitation.   Anatomy and Cardiac Procedures: - Group verbal and visual presentation and models provide information about basic cardiac anatomy and function. Reviews the testing methods done to diagnose heart disease and the outcomes of the test results. Describes the treatment choices: Medical Management, Angioplasty, or Coronary Bypass Surgery for treating various heart conditions including Myocardial Infarction, Angina, Valve Disease, and Cardiac Arrhythmias.  Written material given at graduation. Flowsheet Row Cardiac Rehab from 10/19/2021 in [facility] Cardiac and Pulmonary Rehab  Education need identified 08/08/21  Date 10/05/21  Educator SB  Instruction Review Code 1- Verbalizes Understanding       Medication Safety: - Group verbal and visual instruction to review commonly prescribed medications for heart and lung disease. Reviews the medication, class of the drug, and side effects. Includes the steps to properly store meds and maintain the prescription regimen.  Written material given at graduation. Flowsheet Row Cardiac Rehab from 10/19/2021 in [facility] Cardiac and Pulmonary Rehab  Education need identified 08/08/21  Date 08/24/21  Educator SB  Instruction Review Code 1- Verbalizes Understanding        Intimacy: - Group verbal  instruction through game format to discuss how heart and lung disease can affect sexual intimacy. Written material given at graduation.. Flowsheet Row Cardiac Rehab from 10/19/2021 in [facility] Cardiac and Pulmonary Rehab  Date 09/28/21  Educator [facility]  Instruction Review Code 1- Verbalizes Understanding       Know Your Numbers and Heart Failure: - Group verbal and visual instruction to discuss disease risk factors for cardiac and pulmonary disease and treatment options.  Reviews associated critical values for Overweight/Obesity, Hypertension, Cholesterol, and Diabetes.  Discusses basics of heart failure: signs/symptoms and treatments.  Introduces Heart Failure Zone chart for action plan for heart failure.  Written material given at graduation. Flowsheet Row Cardiac Rehab from 12/29/2020 in [facility] Cardiac and Pulmonary Rehab  Date 12/22/20  Educator SB  Instruction Review Code 1- Verbalizes Understanding       Infection Prevention: - Provides verbal and written material to individual with discussion of infection control including proper hand washing and proper equipment cleaning during exercise session. Flowsheet Row Cardiac Rehab from 10/19/2021 in [facility] Cardiac and Pulmonary Rehab  Date 08/08/21  Educator [facility]  Instruction Review Code 1- Verbalizes Understanding       Falls Prevention: - Provides verbal and written material to individual with discussion of falls prevention and safety. Flowsheet Row Cardiac Rehab from 10/19/2021 in [facility] Cardiac and Pulmonary Rehab  Date 08/08/21  Educator [facility]  Instruction Review Code 1- Verbalizes Understanding       Other: -Provides group and verbal instruction on various topics (see comments)   Knowledge Questionnaire Score:  Knowledge Questionnaire Score - 10/21/21 0737       Knowledge Questionnaire Score   Pre Score 23/26    Post Score 26/26             Core Components/Risk Factors/Patient Goals at  Admission:  Personal Goals and Risk Factors at Admission - 08/08/21 1611       Core Components/Risk Factors/Patient Goals on Admission    Weight Management Yes;Weight Loss    Intervention Weight Management: Develop a combined nutrition and exercise program designed to reach desired caloric intake, while maintaining appropriate intake of nutrient and fiber, sodium and fats, and appropriate energy expenditure required for the weight goal.;Weight Management: Provide education and appropriate resources to help participant work on and attain dietary goals.;Weight Management/Obesity: Establish reasonable short term and long term weight goals.    Admit Weight 171 lb 1.6 oz (77.6 kg)    Goal Weight: Short Term 168 lb (76.2 kg)    Goal Weight: Long Term 165 lb (74.8 kg)    Expected Outcomes Short Term: Continue to assess and modify interventions until short term weight is achieved;Long Term: Adherence to nutrition and physical activity/exercise program aimed toward attainment of established weight goal;Weight Loss: Understanding of general recommendations for a balanced deficit meal plan, which promotes 1-2 lb weight loss per week and includes a negative energy balance of [phone] kcal/d;Understanding recommendations for meals to include 15-35% energy as protein, 25-35% energy from fat, 35-60% energy from carbohydrates, less than $RemoveB'200mg'MmgiLoAo$  of dietary cholesterol, 20-35 gm of total fiber daily;Understanding of distribution of calorie intake throughout the day with the consumption of 4-5 meals/snacks    Diabetes Yes    Intervention Provide education about signs/symptoms and action to take for hypo/hyperglycemia.;Provide education about proper nutrition, including hydration, and aerobic/resistive exercise prescription along with prescribed medications to achieve blood glucose in normal ranges: Fasting glucose 65-99 mg/dL    Expected Outcomes Short Term: Participant verbalizes understanding of the  signs/symptoms and  immediate care of hyper/hypoglycemia, proper foot care and importance of medication, aerobic/resistive exercise and nutrition plan for blood glucose control.;Long Term: Attainment of HbA1C < 7%.    Hypertension Yes    Intervention Provide education on lifestyle modifcations including regular physical activity/exercise, weight management, moderate sodium restriction and increased consumption of fresh fruit, vegetables, and low fat dairy, alcohol moderation, and smoking cessation.;Monitor prescription use compliance.    Expected Outcomes Short Term: Continued assessment and intervention until BP is < 140/44mm HG in hypertensive participants. < 130/58mm HG in hypertensive participants with diabetes, heart failure or chronic kidney disease.;Long Term: Maintenance of blood pressure at goal levels.    Lipids Yes    Intervention Provide education and support for participant on nutrition & aerobic/resistive exercise along with prescribed medications to achieve LDL '70mg'$ , HDL >$Remo'40mg'tVgNE$ .    Expected Outcomes Short Term: Participant states understanding of desired cholesterol values and is compliant with medications prescribed. Participant is following exercise prescription and nutrition guidelines.;Long Term: Cholesterol controlled with medications as prescribed, with individualized exercise RX and with personalized nutrition plan. Value goals: LDL < $Rem'70mg'FyFB$ , HDL > 40 mg.             Education:Diabetes - Individual verbal and written instruction to review signs/symptoms of diabetes, desired ranges of glucose level fasting, after meals and with exercise. Acknowledge that pre and post exercise glucose checks will be done for 3 sessions at entry of program. [place] from 10/19/2021 in [facility] Cardiac and Pulmonary Rehab  Date 08/08/21  Educator [facility]  Instruction Review Code 1- Verbalizes Understanding       Core Components/Risk Factors/Patient Goals Review:   Goals and Risk Factor Review     Row  Name 08/31/21 0746 09/30/21 0734 10/14/21 0829         Core Components/Risk Factors/Patient Goals Review   Personal Goals Review Weight Management/Obesity;Diabetes;Hypertension;Lipids Weight Management/Obesity;Diabetes Weight Management/Obesity;Diabetes;Hypertension     Review Suhas is doing well in rehab.  His weight is starting to come down some and wants to lose some more.  His sugars are holding steady averaging around 140 mg/dl.  His pressures have been good and he continues to check them at home.  Currently, he is doing well with his medications. Kyrie states his sugars are doing well and they are around 120-140 with his fasting blood glucose. He checks them routinely and knows when to take his medications. He would like to lose about 5 more pounds. Danuel continue to work on his weight loss. His sugars continues to stay steady and he feels that his diabetes control has gotten better.  His pressures continue to do well.     Expected Outcomes Short: Continue to work on weight loss Long: Continue monitor risk factors. Short: lose 5 more pounds. Long: maintain weight weight independently. Continue to monitor risk factors.              Core Components/Risk Factors/Patient Goals at Discharge (Final Review):   Goals and Risk Factor Review - 10/14/21 0829       Core Components/Risk Factors/Patient Goals Review   Personal Goals Review Weight Management/Obesity;Diabetes;Hypertension    Review Dorsel continue to work on his weight loss. His sugars continues to stay steady and he feels that his diabetes control has gotten better.  His pressures continue to do well.    Expected Outcomes Continue to monitor risk factors.             ITP Comments:  ITP Comments  [place] Name 07/22/21 1304 08/08/21 1554 08/10/21 0732 08/17/21 0850 09/14/21 1352   ITP Comments Initial telephone orientation completed. Diagnosis can be found in [facility] 2/9. EP orientation scheduled for Monday 3/13 1:30pm. Completed  6MWT and gym orientation. Initial ITP created and sent for review to Dr. Emily Filbert, Medical Director. First full day of exercise!  Patient was oriented to gym and equipment including functions, settings, policies, and procedures.  Patient's individual exercise prescription and treatment plan were reviewed.  All starting workloads were established based on the results of the 6 minute walk test done at initial orientation visit.  The plan for exercise progression was also introduced and progression will be customized based on patient's performance and goals. 30 Day review completed. Medical Director ITP review done, changes made as directed, and signed approval by Medical Director.   New to program 30 Day review completed. Medical Director ITP review done, changes made as directed, and signed approval by Medical Director.    [place] Name 10/12/21 0905 10/26/21 0729         ITP Comments 30 Day review completed. Medical Director ITP review done, changes made as directed, and signed approval by Medical Director. Quentin graduated today from  rehab with 36 sessions completed.  Details of the patient's exercise prescription and what He needs to do in order to continue the prescription and progress were discussed with patient.  Patient was given a copy of prescription and goals.  Patient verbalized understanding.  Janari plans to continue to exercise by walking and riding his bike at home.               Comments: discharge ITP

## 2021-10-26 NOTE — Progress Notes (Signed)
Discharge Report Referring Provider: Serafina Royals, MD  Lennette Bihari graduated today from  rehab with 36 sessions completed.  Details of the patient's exercise prescription and what He needs to do in order to continue the prescription and progress were discussed with patient.  Patient was given a copy of prescription and goals.  Patient verbalized understanding.  Nichlas plans to continue to exercise by walking and riding his bike at home.   [place] Name 08/08/21 1557 10/14/21 0741       6 Minute Walk   Phase Initial Discharge    Distance 1420 feet 1850 feet    Distance % Change -- 30.3 %    Distance Feet Change -- 430 ft    Walk Time 6 minutes 6 minutes    # of Rest Breaks 0 0    MPH 2.69 3.5    METS 3.46 4.53    RPE 9 12    Perceived Dyspnea  2 --    VO2 Peak 12.11 15.86    Symptoms No Yes (comment)    Comments -- hip pain 2/10    Resting HR 67 bpm 86 bpm    Resting BP 92/60 108/64    Resting Oxygen Saturation  98 % 95 %    Exercise Oxygen Saturation  during 6 min walk 95 % 91 %    Max Ex. HR 97 bpm 122 bpm    Max Ex. BP 112/62 126/74    2 Minute Post BP 104/60 --

## 2022-02-06 ENCOUNTER — Ambulatory Visit: Payer: 59 | Admitting: Dermatology

## 2022-03-09 ENCOUNTER — Ambulatory Visit (INDEPENDENT_AMBULATORY_CARE_PROVIDER_SITE_OTHER): Payer: 59 | Admitting: Dermatology

## 2022-03-09 DIAGNOSIS — L813 Cafe au lait spots: Secondary | ICD-10-CM

## 2022-03-09 DIAGNOSIS — Z79899 Other long term (current) drug therapy: Secondary | ICD-10-CM

## 2022-03-09 DIAGNOSIS — L814 Other melanin hyperpigmentation: Secondary | ICD-10-CM | POA: Diagnosis not present

## 2022-03-09 DIAGNOSIS — L918 Other hypertrophic disorders of the skin: Secondary | ICD-10-CM

## 2022-03-09 DIAGNOSIS — L719 Rosacea, unspecified: Secondary | ICD-10-CM

## 2022-03-09 DIAGNOSIS — L304 Erythema intertrigo: Secondary | ICD-10-CM | POA: Diagnosis not present

## 2022-03-09 DIAGNOSIS — Z1283 Encounter for screening for malignant neoplasm of skin: Secondary | ICD-10-CM | POA: Diagnosis not present

## 2022-03-09 DIAGNOSIS — L578 Other skin changes due to chronic exposure to nonionizing radiation: Secondary | ICD-10-CM

## 2022-03-09 DIAGNOSIS — L821 Other seborrheic keratosis: Secondary | ICD-10-CM

## 2022-03-09 DIAGNOSIS — D229 Melanocytic nevi, unspecified: Secondary | ICD-10-CM

## 2022-03-09 NOTE — Patient Instructions (Signed)
Due to recent changes in healthcare laws, you may see results of your pathology and/or laboratory studies on MyChart before the doctors have had a chance to review them. We understand that in some cases there may be results that are confusing or concerning to you. Please understand that not all results are received at the same time and often the doctors may need to interpret multiple results in order to provide you with the best plan of care or course of treatment. Therefore, we ask that you please give us 2 business days to thoroughly review all your results before contacting the office for clarification. Should we see a critical lab result, you will be contacted sooner.   If You Need Anything After Your Visit  If you have any questions or concerns for your doctor, please call our main line at [phone] and press option 4 to reach your doctor's medical assistant. If no one answers, please leave a voicemail as directed and we will return your call as soon as possible. Messages left after 4 pm will be answered the following business day.   You may also send us a message via MyChart. We typically respond to MyChart messages within 1-2 business days.  For prescription refills, please ask your pharmacy to contact our office. Our fax number is [phone].  If you have an urgent issue when the clinic is closed that cannot wait until the next business day, you can page your doctor at the number below.    Please note that while we do our best to be available for urgent issues outside of office hours, we are not available 24/7.   If you have an urgent issue and are unable to reach us, you may choose to seek medical care at your doctor's office, retail clinic, urgent care center, or emergency room.  If you have a medical emergency, please immediately call 911 or go to the emergency department.  Pager Numbers  - Dr. Kowalski: [phone]  - Dr. Moye: [phone]  - Dr. Stewart:  [phone]  In the event of inclement weather, please call our main line at [phone] for an update on the status of any delays or closures.  Dermatology Medication Tips: Please keep the boxes that topical medications come in in order to help keep track of the instructions about where and how to use these. Pharmacies typically print the medication instructions only on the boxes and not directly on the medication tubes.   If your medication is too expensive, please contact our office at [phone] option 4 or send us a message through MyChart.   We are unable to tell what your co-pay for medications will be in advance as this is different depending on your insurance coverage. However, we may be able to find a substitute medication at lower cost or fill out paperwork to get insurance to cover a needed medication.   If a prior authorization is required to get your medication covered by your insurance company, please allow us 1-2 business days to complete this process.  Drug prices often vary depending on where the prescription is filled and some pharmacies may offer cheaper prices.  The website www.goodrx.com contains coupons for medications through different pharmacies. The prices here do not account for what the cost may be with help from insurance (it may be cheaper with your insurance), but the website can give you the price if you did not use any insurance.  - You can print the associated coupon and take it with   your prescription to the pharmacy.  - You may also stop by our office during regular business hours and pick up a GoodRx coupon card.  - If you need your prescription sent electronically to a different pharmacy, notify our office through [facility] MyChart or by phone at [phone] option 4.     Si Usted Necesita Algo Despus de Su Visita  Tambin puede enviarnos un mensaje a travs de MyChart. Por lo general respondemos a los mensajes de MyChart en el transcurso de 1 a 2  das hbiles.  Para renovar recetas, por favor pida a su farmacia que se ponga en contacto con nuestra oficina. Nuestro nmero de fax es el [phone].  Si tiene un asunto urgente cuando la clnica est cerrada y que no puede esperar hasta el siguiente da hbil, puede llamar/localizar a su doctor(a) al nmero que aparece a continuacin.   Por favor, tenga en cuenta que aunque hacemos todo lo posible para estar disponibles para asuntos urgentes fuera del horario de oficina, no estamos disponibles las 24 horas del da, los 7 das de la semana.   Si tiene un problema urgente y no puede comunicarse con nosotros, puede optar por buscar atencin mdica  en el consultorio de su doctor(a), en una clnica privada, en un centro de atencin urgente o en una sala de emergencias.  Si tiene una emergencia mdica, por favor llame inmediatamente al 911 o vaya a la sala de emergencias.  Nmeros de bper  - Dr. Kowalski: [phone]  - Dra. Moye: [phone]  - Dra. Stewart: [phone]  En caso de inclemencias del tiempo, por favor llame a nuestra lnea principal al [phone] para una actualizacin sobre el estado de cualquier retraso o cierre.  Consejos para la medicacin en dermatologa: Por favor, guarde las cajas en las que vienen los medicamentos de uso tpico para ayudarle a seguir las instrucciones sobre dnde y cmo usarlos. Las farmacias generalmente imprimen las instrucciones del medicamento slo en las cajas y no directamente en los tubos del medicamento.   Si su medicamento es muy caro, por favor, pngase en contacto con nuestra oficina llamando al [phone] y presione la opcin 4 o envenos un mensaje a travs de MyChart.   No podemos decirle cul ser su copago por los medicamentos por adelantado ya que esto es diferente dependiendo de la cobertura de su seguro. Sin embargo, es posible que podamos encontrar un medicamento sustituto a menor costo o llenar un formulario para que el  seguro cubra el medicamento que se considera necesario.   Si se requiere una autorizacin previa para que su compaa de seguros cubra su medicamento, por favor permtanos de 1 a 2 das hbiles para completar este proceso.  Los precios de los medicamentos varan con frecuencia dependiendo del lugar de dnde se surte la receta y alguna farmacias pueden ofrecer precios ms baratos.  El sitio web www.goodrx.com tiene cupones para medicamentos de diferentes farmacias. Los precios aqu no tienen en cuenta lo que podra costar con la ayuda del seguro (puede ser ms barato con su seguro), pero el sitio web puede darle el precio si no utiliz ningn seguro.  - Puede imprimir el cupn correspondiente y llevarlo con su receta a la farmacia.  - Tambin puede pasar por nuestra oficina durante el horario de atencin regular y recoger una tarjeta de cupones de GoodRx.  - Si necesita que su receta se enve electrnicamente a una farmacia diferente, informe a nuestra oficina a travs de MyChart de [facility]   o por telfono llamando al [phone] y presione la opcin 4.  

## 2022-03-09 NOTE — Progress Notes (Signed)
Follow-Up Visit   Subjective  Frank Wong is a 66 y.o. male who presents for the following: UBSE (Hx AK's, intertrigo with pruritis of the groin for which he is using Skin Medicinals mix and needs refills of). The patient presents for Upper Body Skin Exam (UBSE) for skin cancer screening and mole check.  The patient has spots, moles and lesions to be evaluated, some may be new or changing and the patient has concerns that these could be cancer.  The following portions of the chart were reviewed this encounter and updated as appropriate:   Tobacco  Allergies  Meds  Problems  Med Hx  Surg Hx  Fam Hx     Review of Systems:  No other skin or systemic complaints except as noted in HPI or Assessment and Plan.  Objective  Well appearing patient in no apparent distress; mood and affect are within normal limits.  All skin waist up examined.  Face Mid face erythema with telangiectasias +/- scattered inflammatory papules.    Assessment & Plan  Erythema intertrigo Groin  Intertrigo is a chronic recurrent rash that occurs in skin fold areas that may be associated with friction; heat; moisture; yeast; fungus; and bacteria.  It is exacerbated by increased movement / activity; sweating; and higher atmospheric temperature.  Continue Skin Medicinals mix BID PRN.  Rosacea Face  Rosacea is a chronic progressive skin condition usually affecting the face of adults, causing redness and/or acne bumps. It is treatable but not curable. It sometimes affects the eyes (ocular rosacea) as well. It may respond to topical and/or systemic medication and can flare with stress, sun exposure, alcohol, exercise, topical steroids (including hydrocortisone/cortisone 10) and some foods.  Daily application of broad spectrum spf 30+ sunscreen to face is recommended to reduce flares.  Discussed the treatment option of BBL/laser.  Typically we recommend 1-3 treatment sessions about 5-8 weeks apart for best  results.  The patient's condition may require "maintenance treatments" in the future.  The fee for BBL / laser treatments is $350 per treatment session for the whole face.  A fee can be quoted for other parts of the body. Insurance typically does not pay for BBL/laser treatments and therefore the fee is an out-of-pocket cost.   Lentigines - Scattered tan macules - Due to sun exposure - Benign-appearing, observe - Recommend daily broad spectrum sunscreen SPF 30+ to sun-exposed areas, reapply every 2 hours as needed. - Call for any changes  Seborrheic Keratoses - Stuck-on, waxy, tan-brown papules and/or plaques  - Benign-appearing - Discussed benign etiology and prognosis. - Observe - Call for any changes  Melanocytic Nevi - Tan-brown and/or pink-flesh-colored symmetric macules and papules - Benign appearing on exam today - Observation - Call clinic for new or changing moles - Recommend daily use of broad spectrum spf 30+ sunscreen to sun-exposed areas.   Hemangiomas - Red papules - Discussed benign nature - Observe - Call for any changes  Actinic Damage - Chronic condition, secondary to cumulative UV/sun exposure - diffuse scaly erythematous macules with underlying dyspigmentation - Recommend daily broad spectrum sunscreen SPF 30+ to sun-exposed areas, reapply every 2 hours as needed.  - Staying in the shade or wearing long sleeves, sun glasses (UVA+UVB protection) and wide brim hats (4-inch brim around the entire circumference of the hat) are also recommended for sun protection.  - Call for new or changing lesions.  Cafe au Lait - L top of shoulder - Tan patch - Genetic - Benign, observe -  Call for any changes  Acrochordons (Skin Tags) - Fleshy, skin-colored pedunculated papules - Benign appearing.  - Observe. - If desired, they can be removed with an in office procedure that is not covered by insurance. - Please call the clinic if you notice any new or changing  lesions.  Skin cancer screening performed today.  Return in about 1 year (around 03/10/2023) for TBSE.  [name] Redo, CMA, am acting as scribe for Sarina Ser, MD . Documentation: I have reviewed the above documentation for accuracy and completeness, and I agree with the above.  Sarina Ser, MD

## 2022-03-22 ENCOUNTER — Encounter: Payer: Self-pay | Admitting: Dermatology

## 2022-08-08 ENCOUNTER — Encounter: Payer: Self-pay | Admitting: Urology

## 2022-08-08 ENCOUNTER — Ambulatory Visit: Payer: 59 | Admitting: Urology

## 2022-08-08 VITALS — BP 120/75 | HR 80 | Ht 68.0 in | Wt 173.8 lb

## 2022-08-08 DIAGNOSIS — E291 Testicular hypofunction: Secondary | ICD-10-CM

## 2022-08-08 DIAGNOSIS — N529 Male erectile dysfunction, unspecified: Secondary | ICD-10-CM | POA: Diagnosis not present

## 2022-08-08 MED ORDER — TADALAFIL 10 MG PO TABS: 10.0000 mg | ORAL_TABLET | ORAL | 11 refills | Status: DC

## 2022-08-08 MED ORDER — TESTOSTERONE 20.25 MG/ACT (1.62%) TD GEL: 2.0000 | Freq: Every morning | TRANSDERMAL | 5 refills | Status: DC

## 2022-08-08 NOTE — Patient Instructions (Addendum)
Dr. Cain Sieve is a urologist in [employer] with [facility] that performs penile prosthesis.  This is typically a 1 hour surgery, and is a fusion for patients with diabetes.  If you are interested in learning more about that procedure, please contact his office for an appointment.  Erectile Dysfunction Erectile dysfunction (ED) is the inability to get or keep an erection in order to have sexual intercourse. ED is considered a symptom of an underlying disorder and is not considered a disease. ED may include: Inability to get an erection. Lack of enough hardness of the erection to allow penetration. Loss of erection before sex is finished. What are the causes? This condition may be caused by: Physical causes, such as: Artery problems. This may include heart disease, high blood pressure, atherosclerosis, and diabetes. Hormonal problems, such as low testosterone. Obesity. Nerve problems. This may include back or pelvic injuries, multiple sclerosis, Parkinson's disease, spinal cord injury, and stroke. Certain medicines, such as: Pain relievers. Antidepressants. Blood pressure medicines and water pills (diuretics). Cancer medicines. Antihistamines. Muscle relaxants. Lifestyle factors, such as: Use of drugs such as marijuana, cocaine, or opioids. Excessive use of alcohol. Smoking. Lack of physical activity or exercise. Psychological causes, such as: Anxiety or stress. Sadness or depression. Exhaustion. Fear about sexual performance. Guilt. What are the signs or symptoms? Symptoms of this condition include: Inability to get an erection. Lack of enough hardness of the erection to allow penetration. Loss of the erection before sex is finished. Sometimes having normal erections, but with frequent unsatisfactory episodes. Low sexual satisfaction in either partner due to erection problems. A curved penis occurring with erection. The curve may cause pain, or the penis may be too curved to  allow for intercourse. Never having nighttime or morning erections. How is this diagnosed? This condition is often diagnosed by: Performing a physical exam to find other diseases or specific problems with the penis. Asking you detailed questions about the problem. Doing tests, such as: Blood tests to check for diabetes mellitus or high cholesterol, or to measure hormone levels. Other tests to check for underlying health conditions. An ultrasound exam to check for scarring. A test to check blood flow to the penis. Doing a sleep study at home to measure nighttime erections. How is this treated? This condition may be treated by: Medicines, such as: Medicine taken by mouth to help you achieve an erection (oral medicine). Hormone replacement therapy to replace low testosterone levels. Medicine that is injected into the penis. Your health care provider may instruct you how to give yourself these injections at home. Medicine that is delivered with a short applicator tube. The tube is inserted into the opening at the tip of the penis, which is the opening of the urethra. A tiny pellet of medicine is put in the urethra. The pellet dissolves and enhances erectile function. This is also called MUSE (medicated urethral system for erections) therapy. Vacuum pump. This is a pump with a ring on it. The pump and ring are placed on the penis and used to create pressure that helps the penis become erect. Penile implant surgery. In this procedure, you may receive: An inflatable implant. This consists of cylinders, a pump, and a reservoir. The cylinders can be inflated with a fluid that helps to create an erection, and they can be deflated after intercourse. A semi-rigid implant. This consists of two silicone rubber rods. The rods provide some rigidity. They are also flexible, so the penis can both curve downward in its normal  position and become straight for sexual intercourse. Blood vessel surgery to improve  blood flow to the penis. During this procedure, a blood vessel from a different part of the body is placed into the penis to allow blood to flow around (bypass) damaged or blocked blood vessels. Lifestyle changes, such as exercising more, losing weight, and quitting smoking. Follow these instructions at home: Medicines  Take over-the-counter and prescription medicines only as told by your health care provider. Do not increase the dosage without first discussing it with your health care provider. If you are using self-injections, do injections as directed by your health care provider. Make sure you avoid any veins that are on the surface of the penis. After giving an injection, apply pressure to the injection site for 5 minutes. Talk to your health care provider about how to prevent headaches while taking ED medicines. These medicines may cause a sudden headache due to the increase in blood flow in your body. General instructions Exercise regularly, as directed by your health care provider. Work with your health care provider to lose weight, if needed. Do not use any products that contain nicotine or tobacco. These products include cigarettes, chewing tobacco, and vaping devices, such as e-cigarettes. If you need help quitting, ask your health care provider. Before using a vacuum pump, read the instructions that come with the pump and discuss any questions with your health care provider. Keep all follow-up visits. This is important. Contact a health care provider if: You feel nauseous. You are vomiting. You get sudden headaches while taking ED medicines. You have any concerns about your sexual health. Get help right away if: You are taking oral or injectable medicines and you have an erection that lasts longer than 4 hours. If your health care provider is unavailable, go to the nearest emergency room for evaluation. An erection that lasts much longer than 4 hours can result in permanent damage to  your penis. You have severe pain in your groin or abdomen. You develop redness or severe swelling of your penis. You have redness spreading at your groin or lower abdomen. You are unable to urinate. You experience chest pain or a rapid heartbeat (palpitations) after taking oral medicines. These symptoms may represent a serious problem that is an emergency. Do not wait to see if the symptoms will go away. Get medical help right away. Call your local emergency services (911 in the [place] Do not drive yourself to the hospital. Summary Erectile dysfunction (ED) is the inability to get or keep an erection during sexual intercourse. This condition is diagnosed based on a physical exam, your symptoms, and tests to determine the cause. Treatment varies depending on the cause and may include medicines, hormone therapy, surgery, or a vacuum pump. You may need follow-up visits to make sure that you are using your medicines or devices correctly. Get help right away if you are taking or injecting medicines and you have an erection that lasts longer than 4 hours. This information is not intended to replace advice given to you by your health care provider. Make sure you discuss any questions you have with your health care provider. Document Revised: 08/11/2020 Document Reviewed: 08/11/2020 Elsevier Patient Education  [place].

## 2022-08-09 ENCOUNTER — Telehealth: Payer: Self-pay | Admitting: Family Medicine

## 2022-08-09 NOTE — Telephone Encounter (Signed)
Called pt, no answer. LM for pt inquiring whether or not he is or is not taking isosorbide.

## 2022-08-09 NOTE — Telephone Encounter (Signed)
Patient called back and states he is taking issosorbide.

## 2022-08-09 NOTE — Telephone Encounter (Signed)
Frank Wong from [facility] called and states he was prescribed Tadalafil and there is an interaction with the RX Imdur. She is wanting to be sure you are aware and to get the ok to fill the medication for him.

## 2022-08-09 NOTE — Progress Notes (Signed)
08/09/22 8:00 AM   Frank Wong April 24, 1956 [id number]  CC: ED, PSA screening, low testosterone  HPI: 67 year old male with a number of comorbidities including CAD, diabetes, COPD who is transferring his care for the above issues from Dr. Eliberto Ivory.  Has had a long term problems with ED and difficulty achieving orgasm.  He previously been on a number of treatments including a vacuum erection device, super Trimix, and PDE 5 inhibitors.  He is interested in trying the PDE 5 inhibitors again.  He also is potentially interested in a penile prosthesis in the future.  He has been on testosterone gel which significantly improves his fatigue.  Outside notes from Dr. Samuel Germany office were reviewed extensively.  PSAs have been normal on testosterone.  PMH: Past Medical History:  Diagnosis Date   Alcoholism ([place])    Asthma    CAD (coronary artery disease)    Cardiac arrest ([place])    Chronic cardiac arrhythmia    Chronic kidney disease    COPD (chronic obstructive pulmonary disease) ([facility])    Depression    Diabetes mellitus without complication ([facility])    Essential tremor    GERD (gastroesophageal reflux disease)    Heart attack ([place])    Heart murmur    Hyperlipidemia    Hypertension    IBS (irritable bowel syndrome)    Shoulder problem 2010   right   Sinoatrial node dysfunction ([place])    Sleep apnea     Surgical History: Past Surgical History:  Procedure Laterality Date   BALLOON DILATION N/A 06/11/2017   Procedure: BALLOON DILATION;  Surgeon: Manya Silvas, MD;  Location[facility] ENDOSCOPY;  Service: Endoscopy;  Laterality: N/A;   COLONOSCOPY WITH PROPOFOL N/A 01/10/2021   Procedure: COLONOSCOPY WITH PROPOFOL;  Surgeon: Lesly Rubenstein, MD;  Location: [facility] ENDOSCOPY;  Service: Endoscopy;  Laterality: N/A;   CORONARY ANGIOPLASTY WITH STENT PLACEMENT     CORONARY STENT INTERVENTION N/A 01/16/2018   Procedure: CORONARY STENT INTERVENTION;  Surgeon: Yolonda Kida, MD;  Location:  [facility] LAB;  Service: Cardiovascular;  Laterality: N/A;   CORONARY STENT INTERVENTION N/A 10/06/2020   Procedure: CORONARY STENT INTERVENTION;  Surgeon: [name], MD;  Location: [facility] CV LAB;  Service: Cardiovascular;  Laterality: N/A;  LAD   ESOPHAGOGASTRODUODENOSCOPY (EGD) WITH PROPOFOL N/A 06/11/2017   Procedure: ESOPHAGOGASTRODUODENOSCOPY (EGD) WITH PROPOFOL;  Surgeon: Manya Silvas, MD;  Location: [facility] ENDOSCOPY;  Service: Endoscopy;  Laterality: N/A;   LEFT HEART CATH AND CORONARY ANGIOGRAPHY Left 01/16/2018   Procedure: LEFT HEART CATH AND CORONARY ANGIOGRAPHY;  Surgeon: Corey Skains, MD;  Location: [facility] CV LAB;  Service: Cardiovascular;  Laterality: Left;   LEFT HEART CATH AND CORONARY ANGIOGRAPHY N/A 10/06/2020   Procedure: LEFT HEART CATH AND CORONARY ANGIOGRAPHY;  Surgeon: Corey Skains, MD;  Location: [facility] LAB;  Service: Cardiovascular;  Laterality: N/A;   LEFT HEART CATH AND CORONARY ANGIOGRAPHY N/A 06/27/2021   Procedure: LEFT HEART CATH AND CORONARY ANGIOGRAPHY;  Surgeon: Corey Skains, MD;  Location: [place] [facility] LAB;  Service: Cardiovascular;  Laterality: N/A;   RHINOPLASTY     SHOULDER SURGERY     SIGMOIDOSCOPY       Family History: No family history on file.  Social History:  reports that he has quit smoking. He has quit using smokeless tobacco. He reports that he does not drink alcohol and does not use drugs.  Physical Exam: BP 120/75 (BP Location: Left Arm, Patient Position:  Sitting, Cuff Size: Normal)   Pulse 80   Ht '5\' 8"'$  (1.727 m)   Wt 173 lb 12.8 oz (78.8 kg)   BMI 26.43 kg/m    Constitutional:  Alert and oriented, No acute distress. Cardiovascular: No clubbing, cyanosis, or edema. Respiratory: Normal respiratory effort, no increased work of breathing. GI: Abdomen is soft, nontender, nondistended, no abdominal masses   Assessment & Plan:   Comorbid 67 year old male with CAD, diabetes,  COPD and long-term ED, trouble with orgasm, low testosterone.  He is interested in continuing his AndroGel prescription for testosterone, and we reviewed the AUA guidelines regarding evaluation and management, and the need for blood work to monitor blood counts and PSA.  He is in agreement.  In terms of erectile dysfunction, I recommended trialing the Cialis 10 mg daily or every other day with an additional 10 mg boost dose as needed.  We can refill the super Trimix dose in the future if he is interested in resuming that.  I also gave him the contact information for Dr. Cain Sieve with [facility] in [place] if he is interested in pursuing penile prosthesis.  Trial of Cialis 10 mg daily or every other day with 10 mg boost dose as needed Testosterone gel refilled RTC 6 months for PSA, H/H, testosterone  Nickolas Madrid, MD 08/09/2022  [facility] [address], [place], [place] [address] [phone]

## 2022-08-09 NOTE — Telephone Encounter (Signed)
Called pt's pharmacy to cancel RX for tadalafil.

## 2023-02-05 ENCOUNTER — Other Ambulatory Visit: Payer: Self-pay | Admitting: Urology

## 2023-02-07 ENCOUNTER — Telehealth: Payer: Self-pay | Admitting: Urology

## 2023-02-07 NOTE — Telephone Encounter (Signed)
Pt called office and said his insurance company told him he would need a PA for his testosterone prior to sending to [facility].

## 2023-02-07 NOTE — Telephone Encounter (Signed)
PA previously received.

## 2023-02-11 NOTE — Progress Notes (Unsigned)
02/12/2023 9:54 PM   Frank Wong [birth year] [id number]  Referring provider: Gracelyn Nurse, MD [address] [place],  [place] [address]  Urological history: 1. Erectile Dysfunction -contributing factors of age, CAD, HTN, COPD, DM, CKD, HLD, depression and history of smoking -failed PDE5i's, Trimix, VED  2. BPH w/LU TS -PSA pending  3. Hypogonadism -contributing factors of age, DM and CKD -testosterone pending -Hemoglobin/hematocrit pending -testosterone gel (1.62%), 2 pumps daily   No chief complaint on file.   HPI: Frank Wong is a 67 y.o. male who presents today for a 6 month follow up.    Previous records reviewed.   I PSS ***    Score:  1-7 Mild 8-19 Moderate 20-35 Severe    SHIM ***    Score: 1-7 Severe ED 8-11 Moderate ED 12-16 Mild-Moderate ED 17-21 Mild ED 22-25 No ED   PMH: Past Medical History:  Diagnosis Date   Alcoholism (HCC)    Asthma    CAD (coronary artery disease)    Cardiac arrest (HCC)    Chronic cardiac arrhythmia    Chronic kidney disease    COPD (chronic obstructive pulmonary disease) (HCC)    Depression    Diabetes mellitus without complication (HCC)    Essential tremor    GERD (gastroesophageal reflux disease)    Heart attack (HCC)    Heart murmur    Hyperlipidemia    Hypertension    IBS (irritable bowel syndrome)    Shoulder problem 2010   right   Sinoatrial node dysfunction (HCC)    Sleep apnea     Surgical History: Past Surgical History:  Procedure Laterality Date   BALLOON DILATION N/A 06/11/2017   Procedure: BALLOON DILATION;  Surgeon: Scot Jun, MD;  Location: [facility] ENDOSCOPY;  Service: Endoscopy;  Laterality: N/A;   COLONOSCOPY WITH PROPOFOL N/A 01/10/2021   Procedure: COLONOSCOPY WITH PROPOFOL;  Surgeon: Regis Bill, MD;  Location: [facility] ENDOSCOPY;  Service: Endoscopy;  Laterality: N/A;   CORONARY ANGIOPLASTY WITH STENT PLACEMENT     CORONARY STENT INTERVENTION N/A  01/16/2018   Procedure: CORONARY STENT INTERVENTION;  Surgeon: Alwyn Pea, MD;  Location: [facility] INVASIVE CV LAB;  Service: Cardiovascular;  Laterality: N/A;   CORONARY STENT INTERVENTION N/A 10/06/2020   Procedure: CORONARY STENT INTERVENTION;  Surgeon: [name], MD;  Location: [facility] INVASIVE CV LAB;  Service: Cardiovascular;  Laterality: N/A;  LAD   ESOPHAGOGASTRODUODENOSCOPY (EGD) WITH PROPOFOL N/A 06/11/2017   Procedure: ESOPHAGOGASTRODUODENOSCOPY (EGD) WITH PROPOFOL;  Surgeon: Scot Jun, MD;  Location: [facility] ENDOSCOPY;  Service: Endoscopy;  Laterality: N/A;   LEFT HEART CATH AND CORONARY ANGIOGRAPHY Left 01/16/2018   Procedure: LEFT HEART CATH AND CORONARY ANGIOGRAPHY;  Surgeon: Lamar Blinks, MD;  Location: [facility] INVASIVE CV LAB;  Service: Cardiovascular;  Laterality: Left;   LEFT HEART CATH AND CORONARY ANGIOGRAPHY N/A 10/06/2020   Procedure: LEFT HEART CATH AND CORONARY ANGIOGRAPHY;  Surgeon: Lamar Blinks, MD;  Location: [facility] INVASIVE CV LAB;  Service: Cardiovascular;  Laterality: N/A;   LEFT HEART CATH AND CORONARY ANGIOGRAPHY N/A 06/27/2021   Procedure: LEFT HEART CATH AND CORONARY ANGIOGRAPHY;  Surgeon: Lamar Blinks, MD;  Location: [facility] INVASIVE CV LAB;  Service: Cardiovascular;  Laterality: N/A;   RHINOPLASTY     SHOULDER SURGERY     SIGMOIDOSCOPY      Home Medications:  Allergies as of 02/12/2023       Reactions   Penicillamine Hives   Penicillins Other (See Comments)  Has patient had a PCN reaction causing immediate rash, facial/tongue/throat swelling, SOB or lightheadedness with hypotension: No Has patient had a PCN reaction causing severe rash involving mucus membranes or skin necrosis: No Has patient had a PCN reaction that required hospitalization No Has patient had a PCN reaction occurring within the last 10 years: No If all of the above answers are "NO", then may proceed with Cephalosporin use.   Esomeprazole Magnesium Rash         Medication List        Accurate as of February 11, 2023  9:54 PM. If you have any questions, ask your nurse or doctor.          amLODipine 2.5 MG tablet Commonly known as: NORVASC Take 2.5 mg by mouth daily.   amphetamine-dextroamphetamine 10 MG 24 hr capsule Commonly known as: ADDERALL XR Take 10 mg by mouth daily.   aspirin EC 81 MG tablet Take 81 mg by mouth in the morning.   atorvastatin 80 MG tablet Commonly known as: LIPITOR Take 80 mg by mouth in the morning.   B-complex with vitamin C tablet Take 1 tablet by mouth in the morning.   benazepril 20 MG tablet Commonly known as: LOTENSIN Take 20 mg by mouth in the morning.   clonazePAM 2 MG tablet Commonly known as: KLONOPIN Take 1 mg by mouth at bedtime as needed (sleep/ anxiety).   ferrous sulfate 325 (65 FE) MG tablet Take 325 mg by mouth in the morning.   fexofenadine 180 MG tablet Commonly known as: ALLEGRA Take 180 mg by mouth in the morning.   Fluticasone-Umeclidin-Vilant 100-62.5-25 MCG/ACT Aepb Inhale 1 puff into the lungs daily.   gabapentin 600 MG tablet Commonly known as: NEURONTIN Take 600 mg by mouth at bedtime.   glipiZIDE 5 MG tablet Commonly known as: GLUCOTROL Take 5 mg by mouth daily.   hyoscyamine 0.375 MG 12 hr tablet Commonly known as: LEVBID Take 0.375 mg by mouth every 12 (twelve) hours as needed for cramping.   Iron-Vit C-Vit B12-Folic Acid 100-250-0.025-1 MG Tabs   isosorbide mononitrate 30 MG 24 hr tablet Commonly known as: IMDUR Take 30 mg by mouth daily.   Jardiance 25 MG Tabs tablet Generic drug: empagliflozin Take 25 mg by mouth in the morning.   levocetirizine 5 MG tablet Commonly known as: XYZAL Take 5 mg by mouth every evening.   lisinopril 2.5 MG tablet Commonly known as: ZESTRIL Take 2.5 mg by mouth daily.   magnesium oxide 400 MG tablet Commonly known as: MAG-OX Take 400 mg by mouth in the morning.   Melatonin-Pyridoxine 5-1 MG Tabs    metFORMIN 1000 MG tablet Commonly known as: GLUCOPHAGE Take 1,000 mg by mouth 2 (two) times daily with a meal.   montelukast 10 MG tablet Commonly known as: SINGULAIR Take 10 mg by mouth at bedtime.   multivitamin with minerals Tabs tablet Take 1 tablet by mouth in the morning.   pantoprazole 40 MG tablet Commonly known as: PROTONIX Take 40 mg by mouth at bedtime.   Semaglutide(0.25 or 0.5MG /DOS) 2 MG/3ML Sopn Inject 0.25 mg weekly for the first 4 weeks, then increase to 0.5 mg weekly.   sertraline 25 MG tablet Commonly known as: ZOLOFT Take 25 mg by mouth daily.   tadalafil 10 MG tablet Commonly known as: Cialis Take 1 tablet (10 mg total) by mouth every other day.   Testosterone 1.62 % Gel APPLY TWO (2) PUMPS TOPICALLY IN THE MORNING TO THE SHOULDERS AS  DIRECTED   traZODone 50 MG tablet Commonly known as: DESYREL Take 50 mg by mouth at bedtime.   triamcinolone cream 0.1 % Commonly known as: KENALOG Apply 1 application  topically 2 (two) times daily as needed (skin irritation/itching).   Trulicity 1.5 MG/0.5ML Sopn Generic drug: Dulaglutide Inject 0.5 mLs into the skin every 7 (seven) days.   Vitamin D-1000 Max St 25 MCG (1000 UT) tablet Generic drug: Cholecalciferol Take 1,000 Units by mouth daily.        Allergies:  Allergies  Allergen Reactions   Penicillamine Hives   Penicillins Other (See Comments)    Has patient had a PCN reaction causing immediate rash, facial/tongue/throat swelling, SOB or lightheadedness with hypotension: No Has patient had a PCN reaction causing severe rash involving mucus membranes or skin necrosis: No Has patient had a PCN reaction that required hospitalization No Has patient had a PCN reaction occurring within the last 10 years: No If all of the above answers are "NO", then may proceed with Cephalosporin use.    Esomeprazole Magnesium Rash    Family History: No family history on file.  Social History:  reports that he  has quit smoking. He has quit using smokeless tobacco. He reports that he does not drink alcohol and does not use drugs.  ROS: Pertinent ROS in HPI  Physical Exam: There were no vitals taken for this visit.  Constitutional:  Well nourished. Alert and oriented, No acute distress. HEENT: [place] AT, moist mucus membranes.  Trachea midline, no masses. Cardiovascular: No clubbing, cyanosis, or edema. Respiratory: Normal respiratory effort, no increased work of breathing. GI: Abdomen is soft, non tender, non distended, no abdominal masses. Liver and spleen not palpable.  No hernias appreciated.  Stool sample for occult testing is not indicated.   GU: No CVA tenderness.  No bladder fullness or masses.  Patient with circumcised/uncircumcised phallus. ***Foreskin easily retracted***  Urethral meatus is patent.  No penile discharge. No penile lesions or rashes. Scrotum without lesions, cysts, rashes and/or edema.  Testicles are located scrotally bilaterally. No masses are appreciated in the testicles. Left and right epididymis are normal. Rectal: Patient with  normal sphincter tone. Anus and perineum without scarring or rashes. No rectal masses are appreciated. Prostate is approximately *** grams, *** nodules are appreciated. Seminal vesicles are normal. Skin: No rashes, bruises or suspicious lesions. Lymph: No cervical or inguinal adenopathy. Neurologic: Grossly intact, no focal deficits, moving all 4 extremities. Psychiatric: Normal mood and affect.  Laboratory Data: Basic Metabolic Panel (BMP) Order: [id number] Component Ref Range & Units 4 mo ago  Glucose 70 - 110 mg/dL 409 High   Sodium 811 - 145 mmol/L 141  Potassium 3.6 - 5.1 mmol/L 4.2  Chloride 97 - 109 mmol/L 105  Carbon Dioxide (CO2) 22.0 - 32.0 mmol/L 27.0  Calcium 8.7 - 10.3 mg/dL 9.3  Urea Nitrogen (BUN) 7 - 25 mg/dL 16  Creatinine 0.7 - 1.3 mg/dL 1.1  Glomerular Filtration Rate (eGFR) >60 mL/min/1.73sq m 75  Comment: CKD-EPI  (2021) does not include patient's race in the calculation of eGFR.  Monitoring changes of plasma creatinine and eGFR over time is useful for monitoring kidney function.  Interpretive Ranges for eGFR (CKD-EPI 2021):  eGFR:       >60 mL/min/1.73 sq. m - Normal eGFR:       30-59 mL/min/1.73 sq. m - Moderately Decreased eGFR:       15-29 mL/min/1.73 sq. m  - Severely Decreased eGFR:       <  15 mL/min/1.73 sq. m  - Kidney Failure   Note: These eGFR calculations do not apply in acute situations when eGFR is changing rapidly or patients on dialysis.  BUN/Crea Ratio 6.0 - 20.0 14.5  Anion Gap w/K 6.0 - 16.0 13.2  Resulting Agency [facility]   Specimen Collected: 09/25/22 07:35   Performed by: [facility] Last Resulted: 09/25/22 10:45  Received From: [facility]  Result Received: 02/05/23 13:56   ontains abnormal data CBC w/auto Differential (5 Part) Order: [id number] Component Ref Range & Units 4 mo ago  WBC (White Blood Cell Count) 4.1 - 10.2 10^3/uL 9.1  RBC (Red Blood Cell Count) 4.69 - 6.13 10^6/uL 5.06  Hemoglobin 14.1 - 18.1 gm/dL 56.3  Hematocrit 87.5 - 52.0 % 47.8  MCV (Mean Corpuscular Volume) 80.0 - 100.0 fl 94.5  MCH (Mean Corpuscular Hemoglobin) 27.0 - 31.2 pg 33.2 High   MCHC (Mean Corpuscular Hemoglobin Concentration) 32.0 - 36.0 gm/dL 64.3  Platelet Count 329 - 450 10^3/uL 166  RDW-CV (Red Cell Distribution Width) 11.6 - 14.8 % 12.8  MPV (Mean Platelet Volume) 9.4 - 12.4 fl 9.9  Neutrophils 1.50 - 7.80 10^3/uL 6.17  Lymphocytes 1.00 - 3.60 10^3/uL 1.47  Monocytes 0.00 - 1.50 10^3/uL 0.81  Eosinophils 0.00 - 0.55 10^3/uL 0.52  Basophils 0.00 - 0.09 10^3/uL 0.09  Neutrophil % 32.0 - 70.0 % 67.8  Lymphocyte % 10.0 - 50.0 % 16.1  Monocyte % 4.0 - 13.0 % 8.9  Eosinophil % 1.0 - 5.0 % 5.7 High   Basophil% 0.0 - 2.0 % 1.0  Immature Granulocyte % <=0.7 % 0.5  Immature Granulocyte Count <=0.06 10^3/L  0.05  Resulting Agency [facility]   Specimen Collected: 09/25/22 07:35   Performed by: [facility] Last Resulted: 09/25/22 08:32  Received From: [facility]  Result Received: 02/05/23 13:56   Hemoglobin A1C Order: [id number] Component Ref Range & Units 4 mo ago  Hemoglobin A1C 4.2 - 5.6 % 7.1 High   Average Blood Glucose (Calc) mg/dL 630  Resulting Agency [facility]  Narrative Performed by [facility] Normal Range:    4.2 - 5.6% Increased Risk:  5.7 - 6.4% Diabetes:        >= 6.5% Glycemic Control for adults with diabetes:  <7%    Specimen Collected: 09/25/22 07:35   Performed by: [facility] Last Resulted: 09/25/22 10:10  [facility]  Result Received: 02/05/23 13:56  I have reviewed the labs.   Pertinent Imaging: N/A  Assessment & Plan:  ***  1. Testosterone deficiency  -testosterone levels are therapeutic/subtherapeutic -H & H *** -continue ***  2. BPH with LUTS -PSA stable -DRE benign -UA benign -PVR < 300 cc -symptoms - *** -most bothersome symptoms are *** -continue conservative management, avoiding bladder irritants and timed voiding's -Initiate alpha-blocker (***), discussed side effects *** -Initiate 5 alpha reductase inhibitor (***), discussed side effects *** -Continue tamsulosin 0.4 mg daily, alfuzosin 10 mg daily, Rapaflo 8 mg daily, terazosin, doxazosin, Cialis 5 mg daily and finasteride 5 mg daily, dutasteride 0.5 mg daily***:refills given -Cannot tolerate medication or medication failure, schedule cystoscopy ***  3. Erectile dysfunction:    - I explained that conditions like diabetes, hypertension, coronary artery disease, peripheral vascular disease, smoking, alcohol consumption, age, sleep apnea and BPH can diminish the ability to have an erection -  I explained the ED may be a risk marker for underlying CVD and he should  follow up with PCP for further studies *** - A recent study published in Sex Med 2018 Apr 13 revealed moderate to vigorous aerobic exercise for 40 minutes 4 times per week can decrease erectile problems caused by physical inactivity, obesity, hypertension, metabolic syndrome and/or cardiovascular diseases *** - We discussed trying a *** different PDE5 inhibitor, intra-urethral suppositories, intracavernous vasoactive drug injection therapy, vacuum erection devices and penile prosthesis implantation   No follow-ups on file.  These notes generated with voice recognition software. I apologize for typographical errors.  Cloretta Ned  [facility] [address]  [address] [place], [place] [address] [phone]

## 2023-02-12 ENCOUNTER — Ambulatory Visit (INDEPENDENT_AMBULATORY_CARE_PROVIDER_SITE_OTHER): Payer: 59 | Admitting: Urology

## 2023-02-12 ENCOUNTER — Encounter: Payer: Self-pay | Admitting: Urology

## 2023-02-12 ENCOUNTER — Other Ambulatory Visit
Admission: RE | Admit: 2023-02-12 | Discharge: 2023-02-12 | Disposition: A | Discharge status: Home / Self Care | Payer: 59 | Attending: Urology | Admitting: Urology

## 2023-02-12 VITALS — BP 109/67 | HR 74 | Ht 68.0 in | Wt 163.4 lb

## 2023-02-12 DIAGNOSIS — N401 Enlarged prostate with lower urinary tract symptoms: Secondary | ICD-10-CM

## 2023-02-12 DIAGNOSIS — N529 Male erectile dysfunction, unspecified: Secondary | ICD-10-CM | POA: Diagnosis not present

## 2023-02-12 DIAGNOSIS — E291 Testicular hypofunction: Secondary | ICD-10-CM | POA: Insufficient documentation

## 2023-02-12 DIAGNOSIS — N138 Other obstructive and reflux uropathy: Secondary | ICD-10-CM

## 2023-02-12 LAB — HEMOGLOBIN AND HEMATOCRIT, BLOOD
HCT: 50.7 % (ref 39.0–52.0)
Hemoglobin: 17.9 g/dL — ABNORMAL HIGH (ref 13.0–17.0)

## 2023-02-12 LAB — PSA: Prostatic Specific Antigen: 0.22 ng/mL (ref 0.00–4.00)

## 2023-02-13 LAB — TESTOSTERONE: Testosterone: 231 ng/dL — ABNORMAL LOW (ref 264–916)

## 2023-02-26 ENCOUNTER — Other Ambulatory Visit: Payer: Self-pay | Admitting: Urology

## 2023-02-26 DIAGNOSIS — E291 Testicular hypofunction: Secondary | ICD-10-CM

## 2023-02-26 MED ORDER — TESTOSTERONE 1.62 % TD GEL: TRANSDERMAL | 2 refills | Status: DC

## 2023-03-14 ENCOUNTER — Ambulatory Visit (INDEPENDENT_AMBULATORY_CARE_PROVIDER_SITE_OTHER): Payer: 59 | Admitting: Dermatology

## 2023-03-14 VITALS — BP 117/74 | HR 77

## 2023-03-14 DIAGNOSIS — D1801 Hemangioma of skin and subcutaneous tissue: Secondary | ICD-10-CM

## 2023-03-14 DIAGNOSIS — L304 Erythema intertrigo: Secondary | ICD-10-CM

## 2023-03-14 DIAGNOSIS — L814 Other melanin hyperpigmentation: Secondary | ICD-10-CM | POA: Diagnosis not present

## 2023-03-14 DIAGNOSIS — D229 Melanocytic nevi, unspecified: Secondary | ICD-10-CM

## 2023-03-14 DIAGNOSIS — Z1283 Encounter for screening for malignant neoplasm of skin: Secondary | ICD-10-CM | POA: Diagnosis not present

## 2023-03-14 DIAGNOSIS — L578 Other skin changes due to chronic exposure to nonionizing radiation: Secondary | ICD-10-CM

## 2023-03-14 DIAGNOSIS — W908XXA Exposure to other nonionizing radiation, initial encounter: Secondary | ICD-10-CM | POA: Diagnosis not present

## 2023-03-14 DIAGNOSIS — Z7189 Other specified counseling: Secondary | ICD-10-CM

## 2023-03-14 DIAGNOSIS — D692 Other nonthrombocytopenic purpura: Secondary | ICD-10-CM

## 2023-03-14 DIAGNOSIS — L821 Other seborrheic keratosis: Secondary | ICD-10-CM

## 2023-03-14 DIAGNOSIS — L501 Idiopathic urticaria: Secondary | ICD-10-CM

## 2023-03-14 DIAGNOSIS — B070 Plantar wart: Secondary | ICD-10-CM

## 2023-03-14 NOTE — Patient Instructions (Addendum)
Instructions for [facility] Medications  One or more of your medications was sent to the [facility] mail order compounding pharmacy. You will receive an email from them and can purchase the medicine through that link. It will then be mailed to your home at the address you confirmed. If for any reason you do not receive an email from them, please check your spam folder. If you still do not find the email, please let us know. [facility] phone number is [phone].       Due to recent changes in healthcare laws, you may see results of your pathology and/or laboratory studies on MyChart before the doctors have had a chance to review them. We understand that in some cases there may be results that are confusing or concerning to you. Please understand that not all results are received at the same time and often the doctors may need to interpret multiple results in order to provide you with the best plan of care or course of treatment. Therefore, we ask that you please give [place] 2 business days to thoroughly review all your results before contacting the office for clarification. Should we see a critical lab result, you will be contacted sooner.   If You Need Anything After Your Visit  If you have any questions or concerns for your doctor, please call our main line at [phone] and press option 4 to reach your doctor's medical assistant. If no one answers, please leave a voicemail as directed and we will return your call as soon as possible. Messages left after 4 pm will be answered the following business day.   You may also send [place] a message via MyChart. We typically respond to MyChart messages within 1-2 business days.  For prescription refills, please ask your pharmacy to contact our office. Our fax number is [phone].  If you have an urgent issue when the clinic is closed that cannot wait until the next business day, you can page your doctor at the number below.    Please note  that while we do our best to be available for urgent issues outside of office hours, we are not available 24/7.   If you have an urgent issue and are unable to reach [place], you may choose to seek medical care at your doctor's office, retail clinic, urgent care center, or emergency room.  If you have a medical emergency, please immediately call 911 or go to the emergency department.  Pager Numbers  - Dr. Gwen Pounds: [phone]  - Dr. Roseanne Reno: [phone]  - Dr. Katrinka Blazing: [phone]   In the event of inclement weather, please call our main line at [phone] for an update on the status of any delays or closures.  Dermatology Medication Tips: Please keep the boxes that topical medications come in in order to help keep track of the instructions about where and how to use these. Pharmacies typically print the medication instructions only on the boxes and not directly on the medication tubes.   If your medication is too expensive, please contact our office at [phone] option 4 or send [place] a message through MyChart.   We are unable to tell what your co-pay for medications will be in advance as this is different depending on your insurance coverage. However, we may be able to find a substitute medication at lower cost or fill out paperwork to get insurance to cover a needed medication.   If a prior authorization is required to get your medication covered by your  insurance company, please allow [place] 1-2 business days to complete this process.  Drug prices often vary depending on where the prescription is filled and some pharmacies may offer cheaper prices.  The website www.goodrx.com contains coupons for medications through different pharmacies. The prices here do not account for what the cost may be with help from insurance (it may be cheaper with your insurance), but the website can give you the price if you did not use any insurance.  - You can print the associated coupon and take it with your  prescription to the pharmacy.  - You may also stop by our office during regular business hours and pick up a GoodRx coupon card.  - If you need your prescription sent electronically to a different pharmacy, notify our office through [facility] or by phone at [phone] option 4.     Si Usted Necesita Algo Despus de Su Visita  Tambin puede enviarnos un mensaje a travs de Clinical cytogeneticist. Por lo general respondemos a los mensajes de MyChart en el transcurso de 1 a 2 das hbiles.  Para renovar recetas, por favor pida a su farmacia que se ponga en contacto con nuestra oficina. Annie Sable de fax es [place] [phone].  Si tiene un asunto urgente cuando la clnica est cerrada y que no puede esperar hasta el siguiente da hbil, puede llamar/localizar a su doctor(a) al nmero que aparece a continuacin.   Por favor, tenga en cuenta que aunque hacemos todo lo posible para estar disponibles para asuntos urgentes fuera del horario de [place], no estamos disponibles las 24 horas del da, los 7 [address]  [address] de la [place].   Si tiene un problema urgente y no puede comunicarse con nosotros, puede optar por buscar atencin mdica  en el consultorio de su doctor(a), en una clnica privada, en un centro de atencin urgente o en una sala de emergencias.  Si tiene Engineer, drilling, por favor llame inmediatamente al 911 o vaya a la sala de emergencias.  Nmeros de bper  - Dr. Gwen Pounds: [phone]  - Dra. Roseanne Reno: [phone]  - Dr. Katrinka Blazing: [phone]   En caso de inclemencias del tiempo, por favor llame a Lacy Duverney principal al [phone] para una actualizacin sobre el [place] de cualquier retraso o cierre.  Consejos para la medicacin en dermatologa: Por favor, guarde las cajas en las que vienen los medicamentos de uso tpico para ayudarle a seguir las instrucciones sobre dnde y cmo usarlos. Las farmacias generalmente imprimen las instrucciones del medicamento slo en las cajas y no  directamente en los tubos del [place].   Si su medicamento es muy caro, por favor, pngase en contacto con Rolm Gala llamando al [phone] y presione la opcin 4 o envenos un mensaje a travs de Clinical cytogeneticist.   No podemos decirle cul ser su copago por los medicamentos por adelantado ya que esto es diferente dependiendo de la cobertura de su seguro. Sin embargo, es posible que podamos encontrar un medicamento sustituto a Audiological scientist un formulario para que el seguro cubra el medicamento que se considera necesario.   Si se requiere una autorizacin previa para que su compaa de seguros [place] su medicamento, por favor permtanos de 1 a 2 das hbiles para completar [address]  Los precios de los medicamentos varan con frecuencia dependiendo del Environmental consultant de dnde se surte la receta y alguna farmacias pueden ofrecer precios ms baratos.  El sitio web www.goodrx.com tiene cupones para medicamentos de Health and safety inspector. Los precios aqu no tienen  en cuenta lo que podra costar con la ayuda del seguro (puede ser ms barato con su seguro), pero el sitio web puede darle el precio si no Visual merchandiser.  - Puede imprimir el cupn correspondiente y llevarlo con su receta a la farmacia.  - Tambin puede pasar por nuestra oficina durante el horario de atencin regular y Education officer, museum una tarjeta de cupones de GoodRx.  - Si necesita que su receta se enve electrnicamente a una farmacia diferente, informe a nuestra oficina a travs de MyChart de [place] o por telfono llamando al [phone] y presione la opcin 4.

## 2023-03-14 NOTE — Progress Notes (Signed)
Follow-Up Visit   Subjective  Frank Wong is a 66 y.o. male who presents for the following: Skin Cancer Screening and Full Body Skin Exam  The patient presents for Total-Body Skin Exam (TBSE) for skin cancer screening and mole check. The patient has spots, moles and lesions to be evaluated, some may be new or changing and the patient may have concern these could be cancer.  The following portions of the chart were reviewed this encounter and updated as appropriate: medications, allergies, medical history  Review of Systems:  No other skin or systemic complaints except as noted in HPI or Assessment and Plan.  Objective  Well appearing patient in no apparent distress; mood and affect are within normal limits.  A full examination was performed including scalp, head, eyes, ears, nose, lips, neck, chest, axillae, abdomen, back, buttocks, bilateral upper extremities, bilateral lower extremities, hands, feet, fingers, toes, fingernails, and toenails. All findings within normal limits unless otherwise noted below.   Relevant physical exam findings are noted in the Assessment and Plan.   Assessment & Plan   SKIN CANCER SCREENING PERFORMED TODAY.  ACTINIC DAMAGE - Chronic condition, secondary to cumulative UV/sun exposure - diffuse scaly erythematous macules with underlying dyspigmentation - Recommend daily broad spectrum sunscreen SPF 30+ to sun-exposed areas, reapply every 2 hours as needed.  - Staying in the shade or wearing long sleeves, sun glasses (UVA+UVB protection) and wide brim hats (4-inch brim around the entire circumference of the hat) are also recommended for sun protection.  - Call for new or changing lesions.  LENTIGINES, SEBORRHEIC KERATOSES, HEMANGIOMAS - Benign normal skin lesions - Benign-appearing - Call for any changes  MELANOCYTIC NEVI - Tan-brown and/or pink-flesh-colored symmetric macules and papules - Benign appearing on exam today - Observation - Call  clinic for new or changing moles - Recommend daily use of broad spectrum spf 30+ sunscreen to sun-exposed areas.   INTERTRIGO Exam Erythematous macerated patches of groin  Chronic condition with duration or expected duration over one year. Currently well-controlled.  Intertrigo is a chronic recurrent rash that occurs in skin fold areas that may be associated with friction; heat; moisture; yeast; fungus; and bacteria.  It is exacerbated by increased movement / activity; sweating; and higher atmospheric temperature.  Treatment Plan Continue [employer] intertrigo mix.   Purpura - Chronic; persistent and recurrent.  Treatable, but not curable. - Violaceous macules and patches - Benign - Related to trauma, age, sun damage and/or use of blood thinners, chronic use of topical and/or oral steroids - Observe - Can use OTC arnica containing moisturizer such as Dermend Bruise Formula if desired - Call for worsening or other concerns  Rash - hx of urticaria Exam: Clear today  Treatment Plan: Ok to continue [facility] OTC PRN.  WART Exam: verrucous papule(s) L foot   Counseling Discussed viral / HPV (Human Papilloma Virus) etiology and risk of spread /infectivity to other areas of body as well as to other people.  Multiple treatments and methods may be required to clear warts and it is possible treatment may not be successful.  Treatment risks include discoloration; scarring and there is still potential for wart recurrence.  Treatment Plan: Pt defers LN2 today due to soccer. Start [employer] wart paste QHS under occulusion.   Return in about 1 year (around 03/13/2024) for TBSE.  Maylene Roes, CMA, am acting as scribe for Armida Sans, MD .   Documentation: I have reviewed the above documentation for accuracy and completeness,  and I agree with the above.  Armida Sans, MD

## 2023-03-27 ENCOUNTER — Encounter: Payer: Self-pay | Admitting: Dermatology

## 2023-05-02 ENCOUNTER — Ambulatory Visit
Admission: RE | Admit: 2023-05-02 | Discharge: 2023-05-02 | Disposition: A | Discharge status: Home / Self Care | Payer: Medicare Other | Attending: Cardiology | Admitting: Cardiology

## 2023-05-02 ENCOUNTER — Encounter
Admission: RE | Disposition: A | Discharge status: Home / Self Care | Payer: Self-pay | Source: Home / Self Care | Attending: Cardiology

## 2023-05-02 ENCOUNTER — Encounter: Payer: Self-pay | Admitting: Cardiology

## 2023-05-02 ENCOUNTER — Other Ambulatory Visit: Payer: Self-pay

## 2023-05-02 DIAGNOSIS — I2510 Atherosclerotic heart disease of native coronary artery without angina pectoris: Secondary | ICD-10-CM | POA: Diagnosis not present

## 2023-05-02 DIAGNOSIS — E782 Mixed hyperlipidemia: Secondary | ICD-10-CM | POA: Diagnosis not present

## 2023-05-02 DIAGNOSIS — I130 Hypertensive heart and chronic kidney disease with heart failure and stage 1 through stage 4 chronic kidney disease, or unspecified chronic kidney disease: Secondary | ICD-10-CM | POA: Diagnosis not present

## 2023-05-02 DIAGNOSIS — N1832 Chronic kidney disease, stage 3b: Secondary | ICD-10-CM | POA: Diagnosis not present

## 2023-05-02 DIAGNOSIS — Z87891 Personal history of nicotine dependence: Secondary | ICD-10-CM | POA: Insufficient documentation

## 2023-05-02 DIAGNOSIS — E1122 Type 2 diabetes mellitus with diabetic chronic kidney disease: Secondary | ICD-10-CM | POA: Insufficient documentation

## 2023-05-02 DIAGNOSIS — T82858A Stenosis of vascular prosthetic devices, implants and grafts, initial encounter: Secondary | ICD-10-CM | POA: Insufficient documentation

## 2023-05-02 DIAGNOSIS — Z01810 Encounter for preprocedural cardiovascular examination: Secondary | ICD-10-CM | POA: Diagnosis not present

## 2023-05-02 DIAGNOSIS — Y831 Surgical operation with implant of artificial internal device as the cause of abnormal reaction of the patient, or of later complication, without mention of misadventure at the time of the procedure: Secondary | ICD-10-CM | POA: Insufficient documentation

## 2023-05-02 DIAGNOSIS — I509 Heart failure, unspecified: Secondary | ICD-10-CM | POA: Diagnosis not present

## 2023-05-02 DIAGNOSIS — Z955 Presence of coronary angioplasty implant and graft: Secondary | ICD-10-CM | POA: Diagnosis not present

## 2023-05-02 DIAGNOSIS — Z7985 Long-term (current) use of injectable non-insulin antidiabetic drugs: Secondary | ICD-10-CM | POA: Insufficient documentation

## 2023-05-02 DIAGNOSIS — R079 Chest pain, unspecified: Secondary | ICD-10-CM

## 2023-05-02 DIAGNOSIS — Z951 Presence of aortocoronary bypass graft: Secondary | ICD-10-CM | POA: Diagnosis not present

## 2023-05-02 DIAGNOSIS — Z7984 Long term (current) use of oral hypoglycemic drugs: Secondary | ICD-10-CM | POA: Insufficient documentation

## 2023-05-02 HISTORY — PX: CORONARY STENT INTERVENTION: CATH118234

## 2023-05-02 HISTORY — PX: LEFT HEART CATH AND CORS/GRAFTS ANGIOGRAPHY: CATH118250

## 2023-05-02 LAB — POCT ACTIVATED CLOTTING TIME: Activated Clotting Time: 308 s

## 2023-05-02 SURGERY — LEFT HEART CATH AND CORS/GRAFTS ANGIOGRAPHY
Anesthesia: Moderate Sedation

## 2023-05-02 MED ORDER — IOHEXOL 300 MG/ML  SOLN
INTRAMUSCULAR | Status: DC | PRN
  Administered 2023-05-02: 298 mL

## 2023-05-02 MED ORDER — SODIUM CHLORIDE 0.9% FLUSH: 3.0000 mL | Freq: Two times a day (BID) | INTRAVENOUS | Status: DC

## 2023-05-02 MED ORDER — PRASUGREL HCL 10 MG PO TABS
10.0000 mg | ORAL_TABLET | Freq: Every day | ORAL | Status: DC
  Filled 2023-05-02: qty 1

## 2023-05-02 MED ORDER — SODIUM CHLORIDE 0.9 % WEIGHT BASED INFUSION: 77.1000 mL/h | INTRAVENOUS | Status: DC

## 2023-05-02 MED ORDER — SODIUM CHLORIDE 0.9 % WEIGHT BASED INFUSION
230.0000 mL/h | INTRAVENOUS | Status: AC
  Administered 2023-05-02: 230 mL/h via INTRAVENOUS

## 2023-05-02 MED ORDER — LABETALOL HCL 5 MG/ML IV SOLN: 10.0000 mg | INTRAVENOUS | Status: DC | PRN

## 2023-05-02 MED ORDER — ACETAMINOPHEN 325 MG PO TABS: 650.0000 mg | ORAL_TABLET | ORAL | Status: DC | PRN

## 2023-05-02 MED ORDER — HEPARIN SODIUM (PORCINE) 1000 UNIT/ML IJ SOLN
INTRAMUSCULAR | Status: AC
  Filled 2023-05-02: qty 10

## 2023-05-02 MED ORDER — ASPIRIN 81 MG PO CHEW
CHEWABLE_TABLET | ORAL | Status: DC | PRN
  Administered 2023-05-02: 324 mg via ORAL

## 2023-05-02 MED ORDER — ASPIRIN 81 MG PO CHEW: 81.0000 mg | CHEWABLE_TABLET | ORAL | Status: DC

## 2023-05-02 MED ORDER — ASPIRIN 81 MG PO CHEW
81.0000 mg | CHEWABLE_TABLET | Freq: Every day | ORAL | Status: DC
Start: 2023-05-03 — End: 2023-05-02

## 2023-05-02 MED ORDER — FENTANYL CITRATE (PF) 100 MCG/2ML IJ SOLN
INTRAMUSCULAR | Status: DC | PRN
  Administered 2023-05-02: 25 ug via INTRAVENOUS

## 2023-05-02 MED ORDER — SODIUM CHLORIDE 0.9% FLUSH: 3.0000 mL | INTRAVENOUS | Status: DC | PRN

## 2023-05-02 MED ORDER — HYDRALAZINE HCL 20 MG/ML IJ SOLN: 10.0000 mg | INTRAMUSCULAR | Status: DC | PRN

## 2023-05-02 MED ORDER — SODIUM CHLORIDE 0.9 % WEIGHT BASED INFUSION
1.0000 mL/kg/h | INTRAVENOUS | Status: DC
Start: 2023-05-02 — End: 2023-05-02

## 2023-05-02 MED ORDER — NITROGLYCERIN 1 MG/10 ML FOR IR/CATH LAB
INTRA_ARTERIAL | Status: AC
  Filled 2023-05-02: qty 10

## 2023-05-02 MED ORDER — HEPARIN (PORCINE) IN NACL 1000-0.9 UT/500ML-% IV SOLN
INTRAVENOUS | Status: DC | PRN
  Administered 2023-05-02: 1000 mL

## 2023-05-02 MED ORDER — PRASUGREL HCL 10 MG PO TABS: 10.0000 mg | ORAL_TABLET | Freq: Every day | ORAL | 0 refills | Status: DC

## 2023-05-02 MED ORDER — FENTANYL CITRATE (PF) 100 MCG/2ML IJ SOLN
INTRAMUSCULAR | Status: AC
  Filled 2023-05-02: qty 2

## 2023-05-02 MED ORDER — ONDANSETRON HCL 4 MG/2ML IJ SOLN: 4.0000 mg | Freq: Four times a day (QID) | INTRAMUSCULAR | Status: DC | PRN

## 2023-05-02 MED ORDER — VERAPAMIL HCL 2.5 MG/ML IV SOLN
INTRAVENOUS | Status: DC | PRN
  Administered 2023-05-02 (×2): 2.5 mg via INTRA_ARTERIAL

## 2023-05-02 MED ORDER — HEPARIN SODIUM (PORCINE) 1000 UNIT/ML IJ SOLN
INTRAMUSCULAR | Status: DC | PRN
  Administered 2023-05-02: 4000 [IU] via INTRAVENOUS
  Administered 2023-05-02: 8000 [IU] via INTRAVENOUS

## 2023-05-02 MED ORDER — VERAPAMIL HCL 2.5 MG/ML IV SOLN
INTRAVENOUS | Status: AC
  Filled 2023-05-02: qty 2

## 2023-05-02 MED ORDER — PRASUGREL HCL 5 MG PO TABS
ORAL_TABLET | ORAL | Status: DC | PRN
  Administered 2023-05-02: 60 mg via ORAL

## 2023-05-02 MED ORDER — NITROGLYCERIN 1 MG/10 ML FOR IR/CATH LAB
INTRA_ARTERIAL | Status: DC | PRN
  Administered 2023-05-02: 200 ug via INTRACORONARY

## 2023-05-02 MED ORDER — HEPARIN (PORCINE) IN NACL 1000-0.9 UT/500ML-% IV SOLN
INTRAVENOUS | Status: AC
Start: 2023-05-02 — End: ?
  Filled 2023-05-02: qty 1000

## 2023-05-02 MED ORDER — ASPIRIN 81 MG PO CHEW
CHEWABLE_TABLET | ORAL | Status: AC
  Filled 2023-05-02: qty 4

## 2023-05-02 MED ORDER — MIDAZOLAM HCL 2 MG/2ML IJ SOLN
INTRAMUSCULAR | Status: AC
  Filled 2023-05-02: qty 2

## 2023-05-02 MED ORDER — SODIUM CHLORIDE 0.9 % IV SOLN: 250.0000 mL | INTRAVENOUS | Status: DC | PRN

## 2023-05-02 MED ORDER — MIDAZOLAM HCL 2 MG/2ML IJ SOLN
INTRAMUSCULAR | Status: DC | PRN
  Administered 2023-05-02: 1 mg via INTRAVENOUS

## 2023-05-02 SURGICAL SUPPLY — 26 items
BALLN TREK RX 2.5X12 (BALLOONS) ×2
BALLN ~~LOC~~ EUPHORA RX 3.0X20 (BALLOONS) ×2
BALLN ~~LOC~~ TREK NEO RX 3.0X12 (BALLOONS) ×2
BALLOON TREK RX 2.5X12 (BALLOONS) IMPLANT
BALLOON ~~LOC~~ EUPHORA RX 3.0X20 (BALLOONS) IMPLANT
BALLOON ~~LOC~~ TREK NEO RX 3.0X12 (BALLOONS) IMPLANT
CATH INFINITI 5 FR IM (CATHETERS) IMPLANT
CATH INFINITI 5 FR JL3.5 (CATHETERS) IMPLANT
CATH INFINITI 5FR JL4 (CATHETERS) IMPLANT
CATH INFINITI JR4 5F (CATHETERS) IMPLANT
CATH VISTA GUIDE 6FR JR4 (CATHETERS) IMPLANT
CATH VISTA GUIDE 6FR JR4 SH (CATHETERS) IMPLANT
DEVICE RAD TR BAND REGULAR (VASCULAR PRODUCTS) IMPLANT
DRAPE BRACHIAL (DRAPES) IMPLANT
GLIDESHEATH SLEND SS 6F .021 (SHEATH) IMPLANT
GUIDEWIRE INQWIRE 1.5J.035X260 (WIRE) IMPLANT
INQWIRE 1.5J .035X260CM (WIRE) ×2
KIT ENCORE 26 ADVANTAGE (KITS) IMPLANT
KIT SYRINGE INJ CVI SPIKEX1 (MISCELLANEOUS) IMPLANT
PACK CARDIAC CATH (CUSTOM PROCEDURE TRAY) ×2 IMPLANT
PROTECTION STATION PRESSURIZED (MISCELLANEOUS) ×2
SET ATX-X65L (MISCELLANEOUS) IMPLANT
STATION PROTECTION PRESSURIZED (MISCELLANEOUS) IMPLANT
STENT ONYX FRONTIER 2.75X15 (Permanent Stent) IMPLANT
TUBING CIL FLEX 10 FLL-RA (TUBING) IMPLANT
WIRE ASAHI PROWATER 180CM (WIRE) IMPLANT

## 2023-05-03 ENCOUNTER — Encounter: Payer: Self-pay | Admitting: Cardiology

## 2023-05-10 ENCOUNTER — Other Ambulatory Visit: Payer: Self-pay | Admitting: Nurse Practitioner

## 2023-05-10 ENCOUNTER — Ambulatory Visit
Admission: RE | Admit: 2023-05-10 | Discharge: 2023-05-10 | Disposition: A | Discharge status: Home / Self Care | Payer: Medicare Other | Source: Ambulatory Visit | Attending: Nurse Practitioner | Admitting: Nurse Practitioner

## 2023-05-10 DIAGNOSIS — Z9889 Other specified postprocedural states: Secondary | ICD-10-CM | POA: Diagnosis present

## 2023-05-10 DIAGNOSIS — R600 Localized edema: Secondary | ICD-10-CM

## 2023-05-10 DIAGNOSIS — R58 Hemorrhage, not elsewhere classified: Secondary | ICD-10-CM

## 2023-05-15 ENCOUNTER — Other Ambulatory Visit: Payer: 59

## 2023-05-15 DIAGNOSIS — E291 Testicular hypofunction: Secondary | ICD-10-CM

## 2023-05-16 LAB — HEMOGLOBIN AND HEMATOCRIT, BLOOD
Hematocrit: 54.4 % — ABNORMAL HIGH (ref 37.5–51.0)
Hemoglobin: 18.3 g/dL — ABNORMAL HIGH (ref 13.0–17.7)

## 2023-05-16 LAB — TESTOSTERONE: Testosterone: 383 ng/dL (ref 264–916)

## 2023-05-24 ENCOUNTER — Encounter: Payer: 59 | Attending: Cardiology | Admitting: *Deleted

## 2023-05-24 ENCOUNTER — Encounter: Payer: Self-pay | Admitting: *Deleted

## 2023-05-24 ENCOUNTER — Telehealth: Payer: Self-pay | Admitting: Urology

## 2023-05-24 DIAGNOSIS — Z955 Presence of coronary angioplasty implant and graft: Secondary | ICD-10-CM

## 2023-05-24 NOTE — Telephone Encounter (Signed)
Would you please give him a call and schedule a lab appointment for hemoglobin and hematocrit in one month?

## 2023-05-24 NOTE — Telephone Encounter (Signed)
Lab work scheduled.

## 2023-05-24 NOTE — Progress Notes (Signed)
Virtual orientation call completed today. he has an appointment on Date: 05/31/2023  for EP eval and gym Orientation.  Documentation of diagnosis can be found in [facility] Date: 05/02/2023 .

## 2023-05-29 ENCOUNTER — Other Ambulatory Visit: Payer: Medicare Other

## 2023-05-31 ENCOUNTER — Encounter: Payer: Medicare Other | Attending: Cardiology

## 2023-05-31 VITALS — Ht 67.5 in | Wt 173.6 lb

## 2023-05-31 DIAGNOSIS — Z955 Presence of coronary angioplasty implant and graft: Secondary | ICD-10-CM | POA: Diagnosis present

## 2023-05-31 NOTE — Progress Notes (Signed)
 Cardiac Individual Treatment Plan  Patient Details  Name: Frank Wong MRN: [id number] Date of Birth: [birth year] Referring Provider:   Flowsheet Row Cardiac Rehab from 05/31/2023 in [facility] Cardiac and Pulmonary Rehab  Referring Provider Ammon Blunt, MD       Initial Encounter Date:  Flowsheet Row Cardiac Rehab from 05/31/2023 in [facility] Cardiac and Pulmonary Rehab  Date 05/31/23       Visit Diagnosis: Status post coronary artery stent placement  Patient's Home Medications on Admission:  Current Outpatient Medications:    acidophilus (RISAQUAD) CAPS capsule, Take 1 capsule by mouth daily., Disp: , Rfl:    amphetamine-dextroamphetamine (ADDERALL XR) 10 MG 24 hr capsule, Take 10 mg by mouth daily., Disp: , Rfl:    aspirin  EC 81 MG tablet, Take 81 mg by mouth in the morning., Disp: , Rfl:    atorvastatin  (LIPITOR ) 80 MG tablet, Take 80 mg by mouth in the morning., Disp: , Rfl:    B Complex-C (B-COMPLEX WITH VITAMIN C) tablet, Take 1 tablet by mouth in the morning. (Patient not taking: Reported on 05/24/2023), Disp: , Rfl:    Cholecalciferol (VITAMIN D-1000 MAX ST) 25 MCG (1000 UT) tablet, Take 1,000 Units by mouth daily. (Patient not taking: Reported on 05/24/2023), Disp: , Rfl:    clonazePAM  (KLONOPIN ) 2 MG tablet, Take 1 mg by mouth at bedtime as needed (sleep/ anxiety)., Disp: , Rfl:    Dulaglutide (TRULICITY) 1.5 MG/0.5ML SOPN, Inject 0.5 mLs into the skin every Sunday., Disp: , Rfl:    fexofenadine (ALLEGRA) 180 MG tablet, Take 180 mg by mouth in the morning., Disp: , Rfl:    Fluticasone -Umeclidin-Vilant 100-62.5-25 MCG/ACT AEPB, Inhale 1 puff into the lungs daily., Disp: , Rfl:    gabapentin  (NEURONTIN ) 600 MG tablet, Take 600 mg by mouth 2 (two) times daily., Disp: , Rfl:    glipiZIDE  (GLUCOTROL  XL) 2.5 MG 24 hr tablet, Take 2.5 mg by mouth daily., Disp: , Rfl:    isosorbide mononitrate (IMDUR) 30 MG 24 hr tablet, Take 30 mg by mouth daily., Disp: , Rfl:    JARDIANCE  25 MG  TABS tablet, Take 25 mg by mouth in the morning., Disp: , Rfl:    levalbuterol (XOPENEX HFA) 45 MCG/ACT inhaler, Inhale 1-2 puffs into the lungs every 6 (six) hours as needed for wheezing or shortness of breath., Disp: , Rfl:    levocetirizine (XYZAL ) 5 MG tablet, Take 5 mg by mouth every evening., Disp: , Rfl:    lisinopril (ZESTRIL) 2.5 MG tablet, Take 2.5 mg by mouth daily., Disp: , Rfl:    magnesium  oxide (MAG-OX) 400 MG tablet, Take 400 mg by mouth at bedtime. (Patient not taking: Reported on 05/24/2023), Disp: , Rfl:    metFORMIN (GLUCOPHAGE) 1000 MG tablet, Take 1,000 mg by mouth 2 (two) times daily with a meal. , Disp: , Rfl:    montelukast  (SINGULAIR ) 10 MG tablet, Take 10 mg by mouth at bedtime., Disp: , Rfl:    Multiple Vitamin (MULTIVITAMIN WITH MINERALS) TABS tablet, Take 1 tablet by mouth in the morning., Disp: , Rfl:    nitroGLYCERIN  (NITROSTAT ) 0.4 MG SL tablet, Place 0.4 mg under the tongue every 5 (five) minutes as needed for chest pain., Disp: , Rfl:    NON FORMULARY, Pt uses a bi-pap nightly, Disp: , Rfl:    pantoprazole  (PROTONIX ) 40 MG tablet, Take 40 mg by mouth at bedtime., Disp: , Rfl:    prasugrel  (EFFIENT ) 10 MG TABS tablet, Take 1 tablet (10 mg  total) by mouth daily., Disp: 30 tablet, Rfl: 0   sertraline (ZOLOFT) 25 MG tablet, Take 25 mg by mouth daily., Disp: , Rfl:    Testosterone  1.62 % GEL, APPLY TWO (2) PUMPS TOPICALLY IN THE MORNING TO THE SHOULDERS AS DIRECTED (Patient not taking: Reported on 04/25/2023), Disp: 75 g, Rfl: 2   traZODone (DESYREL) 50 MG tablet, Take 50 mg by mouth at bedtime., Disp: , Rfl:   Past Medical History: Past Medical History:  Diagnosis Date   Alcoholism (HCC)    Asthma    CAD (coronary artery disease)    Cardiac arrest (HCC)    Chronic cardiac arrhythmia    Chronic kidney disease    COPD (chronic obstructive pulmonary disease) (HCC)    Depression    Diabetes mellitus without complication (HCC)    Essential tremor    GERD  (gastroesophageal reflux disease)    Heart attack (HCC)    Heart murmur    Hyperlipidemia    Hypertension    IBS (irritable bowel syndrome)    Shoulder problem 2010   right   Sinoatrial node dysfunction (HCC)    Sleep apnea     Tobacco Use: Social History   Tobacco Use  Smoking Status Former   Current packs/day: 0.00   Types: Cigarettes   Quit date: 05/29/1993   Years since quitting: 30.0  Smokeless Tobacco Former   Types: Snuff, Chew   Quit date: 05/29/1978    Labs: Review Flowsheet        No data to display           Exercise Target Goals: Exercise Program Goal: Individual exercise prescription set using results from initial 6 min walk test and THRR while considering  patient's activity barriers and safety.   Exercise Prescription Goal: Initial exercise prescription builds to 30-45 minutes a day of aerobic activity, 2-3 days per week.  Home exercise guidelines will be given to patient during program as part of exercise prescription that the participant will acknowledge.   Education: Aerobic Exercise: - Group verbal and visual presentation on the components of exercise prescription. Introduces F.I.T.T principle from ACSM for exercise prescriptions.  Reviews F.I.T.T. principles of aerobic exercise including progression. Written material given at graduation. Flowsheet Row Cardiac Rehab from 10/19/2021 in [facility] Cardiac and Pulmonary Rehab  Date 09/28/21  Educator [facility]  Instruction Review Code 1- Verbalizes Understanding       Education: Resistance Exercise: - Group verbal and visual presentation on the components of exercise prescription. Introduces F.I.T.T principle from ACSM for exercise prescriptions  Reviews F.I.T.T. principles of resistance exercise including progression. Written material given at graduation. Flowsheet Row Cardiac Rehab from 10/19/2021 in [facility] Cardiac and Pulmonary Rehab  Date 10/05/21  Educator [facility]  Instruction Review Code 1- Verbalizes  Understanding        Education: Exercise & Equipment Safety: - Individual verbal instruction and demonstration of equipment use and safety with use of the equipment. Flowsheet Row Cardiac Rehab from 05/31/2023 in [facility] Cardiac and Pulmonary Rehab  Date 05/31/23  Educator MB  Instruction Review Code 1- Verbalizes Understanding       Education: Exercise Physiology & General Exercise Guidelines: - Group verbal and written instruction with models to review the exercise physiology of the cardiovascular system and associated critical values. Provides general exercise guidelines with specific guidelines to those with heart or lung disease.  Flowsheet Row Cardiac Rehab from 10/19/2021 in [facility] Cardiac and Pulmonary Rehab  Date 09/21/21  Educator [facility]  Instruction Review  Code 1- Verbalizes Understanding       Education: Flexibility, Balance, Mind/Body Relaxation: - Group verbal and visual presentation with interactive activity on the components of exercise prescription. Introduces F.I.T.T principle from ACSM for exercise prescriptions. Reviews F.I.T.T. principles of flexibility and balance exercise training including progression. Also discusses the mind body connection.  Reviews various relaxation techniques to help reduce and manage stress (i.e. Deep breathing, progressive muscle relaxation, and visualization). Balance handout provided to take home. Written material given at graduation. Flowsheet Row Cardiac Rehab from 12/29/2020 in [facility] Cardiac and Pulmonary Rehab  Date 12/01/20  Educator [facility]  Instruction Review Code 1- Verbalizes Understanding       Activity Barriers & Risk Stratification:  Activity Barriers & Cardiac Risk Stratification - 05/31/23 1614       Activity Barriers & Cardiac Risk Stratification   Activity Barriers Shortness of Breath;Joint Problems   has COPD; joint issues: bilateral shoulders, bilateral hips, bilateral knees; L shoulder surgery scheduled   Cardiac Risk  Stratification Moderate             6 Minute Walk:  6 Minute Walk     Row Name 05/31/23 1613         6 Minute Walk   Phase Initial     Distance 1705 feet     Walk Time 6 minutes     # of Rest Breaks 0     MPH 3.23     METS 3.76     RPE 8     Perceived Dyspnea  1     VO2 Peak 13.14     Symptoms No     Resting HR 80 bpm     Resting BP 100/60     Resting Oxygen Saturation  93 %     Exercise Oxygen Saturation  during 6 min walk 97 %     Max Ex. HR 108 bpm     Max Ex. BP 100/60     2 Minute Post BP 98/60              Oxygen Initial Assessment:   Oxygen Re-Evaluation:   Oxygen Discharge (Final Oxygen Re-Evaluation):   Initial Exercise Prescription:  Initial Exercise Prescription - 05/31/23 1600       Date of Initial Exercise RX and Referring Provider   Date 05/31/23    Referring Provider Ammon Blunt, MD      Oxygen   Maintain Oxygen Saturation 88% or higher      Treadmill   MPH 3.3    Grade 0.5    Minutes 15    METs 3.75      Elliptical   Level 3    Speed 3    Minutes 15    METs 3.76      REL-XR   Level 3    Speed 50    Minutes 15    METs 3.76      Prescription Details   Frequency (times per week) 3    Duration Progress to 30 minutes of continuous aerobic without signs/symptoms of physical distress      Intensity   THRR 40-80% of Max Heartrate 110-140    Ratings of Perceived Exertion 11-13    Perceived Dyspnea 0-4      Progression   Progression Continue to progress workloads to maintain intensity without signs/symptoms of physical distress.      Resistance Training   Training Prescription Yes    Weight 7 lb    Reps 10-15  Perform Capillary Blood Glucose checks as needed.  Exercise Prescription Changes:   Exercise Prescription Changes     Row Name 05/31/23 1600             Response to Exercise   Blood Pressure (Admit) 100/60       Blood Pressure (Exercise) 100/60       Blood Pressure  (Exit) 98/60       Heart Rate (Admit) 80 bpm       Heart Rate (Exercise) 108 bpm       Heart Rate (Exit) 86 bpm       Oxygen Saturation (Admit) 93 %       Oxygen Saturation (Exercise) 97 %       Oxygen Saturation (Exit) 96 %       Rating of Perceived Exertion (Exercise) 8       Perceived Dyspnea (Exercise) 1       Symptoms none       Comments results       Duration Progress to 30 minutes of  aerobic without signs/symptoms of physical distress       Intensity THRR New         Progression   Progression Continue to progress workloads to maintain intensity without signs/symptoms of physical distress.       Average METs 3.76                Exercise Comments:   Exercise Goals and Review:   Exercise Goals     Row Name 05/31/23 1622             Exercise Goals   Increase Physical Activity Yes       Intervention Provide advice, education, support and counseling about physical activity/exercise needs.;Develop an individualized exercise prescription for aerobic and resistive training based on initial evaluation findings, risk stratification, comorbidities and participant's personal goals.       Expected Outcomes Long Term: Add in home exercise to make exercise part of routine and to increase amount of physical activity.;Short Term: Attend rehab on a regular basis to increase amount of physical activity.;Long Term: Exercising regularly at least 3-5 days a week.       Increase Strength and Stamina Yes       Intervention Provide advice, education, support and counseling about physical activity/exercise needs.;Develop an individualized exercise prescription for aerobic and resistive training based on initial evaluation findings, risk stratification, comorbidities and participant's personal goals.       Expected Outcomes Short Term: Increase workloads from initial exercise prescription for resistance, speed, and METs.;Short Term: Perform resistance training exercises routinely during  rehab and add in resistance training at home;Long Term: Improve cardiorespiratory fitness, muscular endurance and strength as measured by increased METs and functional capacity ( )       Able to understand and use rate of perceived exertion (RPE) scale Yes       Intervention Provide education and explanation on how to use RPE scale       Expected Outcomes Long Term:  Able to use RPE to guide intensity level when exercising independently;Short Term: Able to use RPE daily in rehab to express subjective intensity level       Able to understand and use Dyspnea scale Yes       Intervention Provide education and explanation on how to use Dyspnea scale       Expected Outcomes Short Term: Able to use Dyspnea scale daily in rehab to express subjective sense  of shortness of breath during exertion;Long Term: Able to use Dyspnea scale to guide intensity level when exercising independently       Knowledge and understanding of Target Heart Rate Range (THRR) Yes       Intervention Provide education and explanation of THRR including how the numbers were predicted and where they are located for reference       Expected Outcomes Short Term: Able to state/look up THRR;Long Term: Able to use THRR to govern intensity when exercising independently;Short Term: Able to use daily as guideline for intensity in rehab       Able to check pulse independently Yes       Intervention Provide education and demonstration on how to check pulse in carotid and radial arteries.;Review the importance of being able to check your own pulse for safety during independent exercise       Expected Outcomes Short Term: Able to explain why pulse checking is important during independent exercise;Long Term: Able to check pulse independently and accurately       Understanding of Exercise Prescription Yes       Intervention Provide education, explanation, and written materials on patient's individual exercise prescription       Expected Outcomes  Short Term: Able to explain program exercise prescription;Long Term: Able to explain home exercise prescription to exercise independently                Exercise Goals Re-Evaluation :   Discharge Exercise Prescription (Final Exercise Prescription Changes):  Exercise Prescription Changes - 05/31/23 1600       Response to Exercise   Blood Pressure (Admit) 100/60    Blood Pressure (Exercise) 100/60    Blood Pressure (Exit) 98/60    Heart Rate (Admit) 80 bpm    Heart Rate (Exercise) 108 bpm    Heart Rate (Exit) 86 bpm    Oxygen Saturation (Admit) 93 %    Oxygen Saturation (Exercise) 97 %    Oxygen Saturation (Exit) 96 %    Rating of Perceived Exertion (Exercise) 8    Perceived Dyspnea (Exercise) 1    Symptoms none    Comments results    Duration Progress to 30 minutes of  aerobic without signs/symptoms of physical distress    Intensity THRR New      Progression   Progression Continue to progress workloads to maintain intensity without signs/symptoms of physical distress.    Average METs 3.76             Nutrition:  Target Goals: Understanding of nutrition guidelines, daily intake of sodium 1500mg , cholesterol 200mg , calories 30% from fat and 7% or less from saturated fats, daily to have 5 or more servings of fruits and vegetables.  Education: All About Nutrition: -Group instruction provided by verbal, written material, interactive activities, discussions, models, and posters to present general guidelines for heart healthy nutrition including fat, fiber, MyPlate, the role of sodium in heart healthy nutrition, utilization of the nutrition label, and utilization of this knowledge for meal planning. Follow up email sent as well. Written material given at graduation. Flowsheet Row Cardiac Rehab from 05/31/2023 in [facility] Cardiac and Pulmonary Rehab  Education need identified 05/31/23       Biometrics:  Pre Biometrics - 05/31/23 1623       Pre Biometrics   Height 5'  7.5 (1.715 m)    Weight 173 lb 9.6 oz (78.7 kg)    Waist Circumference 39 inches    Hip Circumference 36.5 inches  Waist to Hip Ratio 1.07 %    BMI (Calculated) 26.77    Single Leg Stand 19.3 seconds              Nutrition Therapy Plan and Nutrition Goals:  Nutrition Therapy & Goals - 05/31/23 1625       Nutrition Therapy   RD appointment deferred Yes      Personal Nutrition Goals   Nutrition Goal RD appointment deferred      Intervention Plan   Intervention Prescribe, educate and counsel regarding individualized specific dietary modifications aiming towards targeted core components such as weight, hypertension, lipid management, diabetes, heart failure and other comorbidities.    Expected Outcomes Short Term Goal: Understand basic principles of dietary content, such as calories, fat, sodium, cholesterol and nutrients.             Nutrition Assessments:  MEDIFICTS Score Key: >=70 Need to make dietary changes  40-70 Heart Healthy Diet <= 40 Therapeutic Level Cholesterol Diet  Flowsheet Row Cardiac Rehab from 05/31/2023 in [facility] Cardiac and Pulmonary Rehab  Picture Your Plate Total Score on Admission 67      Picture Your Plate Scores: <59 Unhealthy dietary pattern with much room for improvement. 41-50 Dietary pattern unlikely to meet recommendations for good health and room for improvement. 51-60 More healthful dietary pattern, with some room for improvement.  >60 Healthy dietary pattern, although there may be some specific behaviors that could be improved.    Nutrition Goals Re-Evaluation:   Nutrition Goals Discharge (Final Nutrition Goals Re-Evaluation):   Psychosocial: Target Goals: Acknowledge presence or absence of significant depression and/or stress, maximize coping skills, provide positive support system. Participant is able to verbalize types and ability to use techniques and skills needed for reducing stress and depression.   Education: Stress,  Anxiety, and Depression - Group verbal and visual presentation to define topics covered.  Reviews how body is impacted by stress, anxiety, and depression.  Also discusses healthy ways to reduce stress and to treat/manage anxiety and depression.  Written material given at graduation. Flowsheet Row Cardiac Rehab from 10/19/2021 in [facility] Cardiac and Pulmonary Rehab  Date 09/14/21  Educator [facility]  Instruction Review Code 1- [employer] Understanding       Education: Sleep Hygiene -Provides group verbal and written instruction about how sleep can affect your health.  Define sleep hygiene, discuss sleep cycles and impact of sleep habits. Review good sleep hygiene tips.    Initial Review & Psychosocial Screening:  Initial Psych Review & Screening - 05/24/23 1444       Initial Review   Current issues with Current Depression;Current Psychotropic Meds      Family Dynamics   Good Support System? Yes   wife and children     Barriers   Psychosocial barriers to participate in program There are no identifiable barriers or psychosocial needs.      Screening Interventions   Interventions Encouraged to exercise;To provide support and resources with identified psychosocial needs;Provide feedback about the scores to participant    Expected Outcomes Short Term goal: Utilizing psychosocial counselor, staff and physician to assist with identification of specific Stressors or current issues interfering with healing process. Setting desired goal for each stressor or current issue identified.;Long Term Goal: Stressors or current issues are controlled or eliminated.;Short Term goal: Identification and review with participant of any Quality of Life or Depression concerns found by scoring the questionnaire.;Long Term goal: The participant improves quality of Life and PHQ9 Scores as seen by post  scores and/or verbalization of changes             Quality of Life Scores:   Quality of Life - 05/31/23 1624        Quality of Life   Select Quality of Life      Quality of Life Scores   Health/Function Pre 25.8 %    Socioeconomic Pre 27.38 %    Psych/Spiritual Pre 28.43 %    Family Pre 27.6 %    GLOBAL Pre 26.94 %            Scores of 19 and below usually indicate a poorer quality of life in these areas.  A difference of  2-3 points is a clinically meaningful difference.  A difference of 2-3 points in the total score of the Quality of Life Index has been associated with significant improvement in overall quality of life, self-image, physical symptoms, and general health in studies assessing change in quality of life.  PHQ-9: Review Flowsheet  More data may exist      05/31/2023 10/21/2021 08/08/2021 01/05/2021 11/01/2020  Depression screen PHQ 2/9  Decreased Interest 1 1 0 0 0  Down, Depressed, Hopeless 0 1 0 1 0  PHQ - 2 Score 1 2 0 1 0  Altered sleeping 2 1 0 1 1  Tired, decreased energy 1 1 1 1 1   Change in appetite 1 1 1  0 0  Feeling bad or failure about yourself  0 0 0 0 0  Trouble concentrating 2 2 2 1 1   Moving slowly or fidgety/restless 1 2 0 0 0  Suicidal thoughts 0 0 0 0 0  PHQ-9 Score 8 9 4 4 3   Difficult doing work/chores Not difficult at all Not difficult at all Somewhat difficult Not difficult at all Somewhat difficult   Interpretation of Total Score  Total Score Depression Severity:  1-4 = Minimal depression, 5-9 = Mild depression, 10-14 = Moderate depression, 15-19 = Moderately severe depression, 20-27 = Severe depression   Psychosocial Evaluation and Intervention:  Psychosocial Evaluation - 05/24/23 1454       Psychosocial Evaluation & Interventions   Comments Dequandre is returning to the program after cardiac stent placement. He is ready to complete the program without any barriers. HIs support system is his wife and children.  He is on Zoloft for depression and the meds help control his symptoms.  He is ready to get started.    Expected Outcomes Short: attend cardiac rehab  for education and exercise. Long: develop and maintain positive self care habits.    Continue Psychosocial Services  Follow up required by staff             Psychosocial Re-Evaluation:   Psychosocial Discharge (Final Psychosocial Re-Evaluation):   Vocational Rehabilitation: Provide vocational rehab assistance to qualifying candidates.   Vocational Rehab Evaluation & Intervention:  Vocational Rehab - 05/24/23 1456       Initial Vocational Rehab Evaluation & Intervention   Assessment shows need for Vocational Rehabilitation No      Vocational Rehab Re-Evaulation   Comments no request for VR             Education: Education Goals: Education classes will be provided on a variety of topics geared toward better understanding of heart health and risk factor modification. Participant will state understanding/return demonstration of topics presented as noted by education test scores.  Learning Barriers/Preferences:   General Cardiac Education Topics:  AED/CPR: - Group verbal and written instruction  with the use of models to demonstrate the basic use of the AED with the basic ABC's of resuscitation.   Anatomy and Cardiac Procedures: - Group verbal and visual presentation and models provide information about basic cardiac anatomy and function. Reviews the testing methods done to diagnose heart disease and the outcomes of the test results. Describes the treatment choices: Medical Management, Angioplasty, or Coronary Bypass Surgery for treating various heart conditions including Myocardial Infarction, Angina, Valve Disease, and Cardiac Arrhythmias.  Written material given at graduation. Flowsheet Row Cardiac Rehab from 10/19/2021 in [facility] Cardiac and Pulmonary Rehab  Education need identified 08/08/21  Date 10/05/21  Educator SB  Instruction Review Code 1- Verbalizes Understanding       Medication Safety: - Group verbal and visual instruction to review commonly prescribed  medications for heart and lung disease. Reviews the medication, class of the drug, and side effects. Includes the steps to properly store meds and maintain the prescription regimen.  Written material given at graduation. Flowsheet Row Cardiac Rehab from 10/19/2021 in [facility] Cardiac and Pulmonary Rehab  Education need identified 08/08/21  Date 08/24/21  Educator SB  Instruction Review Code 1- Verbalizes Understanding       Intimacy: - Group verbal instruction through game format to discuss how heart and lung disease can affect sexual intimacy. Written material given at graduation.. Flowsheet Row Cardiac Rehab from 10/19/2021 in [facility] Cardiac and Pulmonary Rehab  Date 09/28/21  Educator [facility]  Instruction Review Code 1- Verbalizes Understanding       Know Your Numbers and Heart Failure: - Group verbal and visual instruction to discuss disease risk factors for cardiac and pulmonary disease and treatment options.  Reviews associated critical values for Overweight/Obesity, Hypertension, Cholesterol, and Diabetes.  Discusses basics of heart failure: signs/symptoms and treatments.  Introduces Heart Failure Zone chart for action plan for heart failure.  Written material given at graduation. Flowsheet Row Cardiac Rehab from 12/29/2020 in [facility] Cardiac and Pulmonary Rehab  Date 12/22/20  Educator SB  Instruction Review Code 1- Verbalizes Understanding       Infection Prevention: - Provides verbal and written material to individual with discussion of infection control including proper hand washing and proper equipment cleaning during exercise session. Flowsheet Row Cardiac Rehab from 05/31/2023 in [facility] Cardiac and Pulmonary Rehab  Date 05/31/23  Educator MB  Instruction Review Code 1- Verbalizes Understanding       Falls Prevention: - Provides verbal and written material to individual with discussion of falls prevention and safety. Flowsheet Row Cardiac Rehab from 05/31/2023 in [facility] Cardiac and  Pulmonary Rehab  Date 05/31/23  Educator MB  Instruction Review Code 1- Verbalizes Understanding       Other: -Provides group and verbal instruction on various topics (see comments)   Knowledge Questionnaire Score:  Knowledge Questionnaire Score - 05/31/23 1628       Knowledge Questionnaire Score   Pre Score 25/26             Core Components/Risk Factors/Patient Goals at Admission:  Personal Goals and Risk Factors at Admission - 05/31/23 1628       Core Components/Risk Factors/Patient Goals on Admission    Weight Management Yes;Weight Loss    Intervention Weight Management: Develop a combined nutrition and exercise program designed to reach desired caloric intake, while maintaining appropriate intake of nutrient and fiber, sodium and fats, and appropriate energy expenditure required for the weight goal.;Weight Management: Provide education and appropriate resources to help participant work on and attain dietary  goals.;Weight Management/Obesity: Establish reasonable short term and long term weight goals.    Admit Weight 173 lb 9.6 oz (78.7 kg)    Goal Weight: Short Term 170 lb (77.1 kg)    Goal Weight: Long Term 155 lb (70.3 kg)    Expected Outcomes Short Term: Continue to assess and modify interventions until short term weight is achieved;Long Term: Adherence to nutrition and physical activity/exercise program aimed toward attainment of established weight goal;Weight Loss: Understanding of general recommendations for a balanced deficit meal plan, which promotes 1-2 lb weight loss per week and includes a negative energy balance of [phone] kcal/d;Understanding recommendations for meals to include 15-35% energy as protein, 25-35% energy from fat, 35-60% energy from carbohydrates, less than 200mg  of dietary cholesterol, 20-35 gm of total fiber daily;Understanding of distribution of calorie intake throughout the day with the consumption of 4-5 meals/snacks    Diabetes Yes     Intervention Provide education about signs/symptoms and action to take for hypo/hyperglycemia.;Provide education about proper nutrition, including hydration, and aerobic/resistive exercise prescription along with prescribed medications to achieve blood glucose in normal ranges: Fasting glucose 65-99 mg/dL    Expected Outcomes Short Term: Participant verbalizes understanding of the signs/symptoms and immediate care of hyper/hypoglycemia, proper foot care and importance of medication, aerobic/resistive exercise and nutrition plan for blood glucose control.;Long Term: Attainment of HbA1C < 7%.    Hypertension Yes    Intervention Provide education on lifestyle modifcations including regular physical activity/exercise, weight management, moderate sodium restriction and increased consumption of fresh fruit, vegetables, and low fat dairy, alcohol moderation, and smoking cessation.;Monitor prescription use compliance.    Expected Outcomes Short Term: Continued assessment and intervention until BP is < 140/71mm HG in hypertensive participants. < 130/57mm HG in hypertensive participants with diabetes, heart failure or chronic kidney disease.;Long Term: Maintenance of blood pressure at goal levels.    Lipids Yes    Intervention Provide education and support for participant on nutrition & aerobic/resistive exercise along with prescribed medications to achieve LDL 70mg , HDL >40mg .    Expected Outcomes Short Term: Participant states understanding of desired cholesterol values and is compliant with medications prescribed. Participant is following exercise prescription and nutrition guidelines.;Long Term: Cholesterol controlled with medications as prescribed, with individualized exercise RX and with personalized nutrition plan. Value goals: LDL < 70mg , HDL > 40 mg.             Education:Diabetes - Individual verbal and written instruction to review signs/symptoms of diabetes, desired ranges of glucose level  fasting, after meals and with exercise. Acknowledge that pre and post exercise glucose checks will be done for 3 sessions at entry of program. Flowsheet Row Cardiac Rehab from 05/31/2023 in [facility] Cardiac and Pulmonary Rehab  Date 05/31/23  Educator MB  Instruction Review Code 1- Verbalizes Understanding       Core Components/Risk Factors/Patient Goals Review:    Core Components/Risk Factors/Patient Goals at Discharge (Final Review):    ITP Comments:  ITP Comments     Row Name 05/24/23 1457 05/31/23 1611         ITP Comments Virtual orientation call completed today. he has an appointment on Date: 05/31/2023  for EP eval and gym Orientation.  Documentation of diagnosis can be found in [facility] Date: 05/02/2023 . Completed and gym orientation. Initial ITP created and sent for review to Dr. Oneil Pinal, Medical Director.               Comments: Initial ITP

## 2023-05-31 NOTE — Patient Instructions (Signed)
 Patient Instructions  Patient Details  Name: Frank Wong MRN: [id number] Date of Birth: 09-19-1955 Referring Provider:  Ammon Blunt, MD  Below are your personal goals for exercise, nutrition, and risk factors. Our goal is to help you stay on track towards obtaining and maintaining these goals. We will be discussing your progress on these goals with you throughout the program.  Initial Exercise Prescription:  Initial Exercise Prescription - 05/31/23 1600       Date of Initial Exercise RX and Referring Provider   Date 05/31/23    Referring Provider Ammon Blunt, MD      Oxygen   Maintain Oxygen Saturation 88% or higher      Treadmill   MPH 3.3    Grade 0.5    Minutes 15    METs 3.75      Elliptical   Level 3    Speed 3    Minutes 15    METs 3.76      REL-XR   Level 3    Speed 50    Minutes 15    METs 3.76      Prescription Details   Frequency (times per week) 3    Duration Progress to 30 minutes of continuous aerobic without signs/symptoms of physical distress      Intensity   THRR 40-80% of Max Heartrate 110-140    Ratings of Perceived Exertion 11-13    Perceived Dyspnea 0-4      Progression   Progression Continue to progress workloads to maintain intensity without signs/symptoms of physical distress.      Resistance Training   Training Prescription Yes    Weight 7 lb    Reps 10-15             Exercise Goals: Frequency: Be able to perform aerobic exercise two to three times per week in program working toward 2-5 days per week of home exercise.  Intensity: Work with a perceived exertion of 11 (fairly light) - 15 (hard) while following your exercise prescription.  We will make changes to your prescription with you as you progress through the program.   Duration: Be able to do 30 to 45 minutes of continuous aerobic exercise in addition to a 5 minute warm-up and a 5 minute cool-down routine.   Nutrition Goals: Your personal nutrition  goals will be established when you do your nutrition analysis with the dietician.  The following are general nutrition guidelines to follow: Cholesterol < 200mg /day Sodium < 1500mg /day Fiber: Men over 50 yrs - 30 grams per day  Personal Goals:  Personal Goals and Risk Factors at Admission - 05/31/23 1628       Core Components/Risk Factors/Patient Goals on Admission    Weight Management Yes;Weight Loss    Intervention Weight Management: Develop a combined nutrition and exercise program designed to reach desired caloric intake, while maintaining appropriate intake of nutrient and fiber, sodium and fats, and appropriate energy expenditure required for the weight goal.;Weight Management: Provide education and appropriate resources to help participant work on and attain dietary goals.;Weight Management/Obesity: Establish reasonable short term and long term weight goals.    Admit Weight 173 lb 9.6 oz (78.7 kg)    Goal Weight: Short Term 170 lb (77.1 kg)    Goal Weight: Long Term 155 lb (70.3 kg)    Expected Outcomes Short Term: Continue to assess and modify interventions until short term weight is achieved;Long Term: Adherence to nutrition and physical activity/exercise program aimed toward attainment of established  weight goal;Weight Loss: Understanding of general recommendations for a balanced deficit meal plan, which promotes 1-2 lb weight loss per week and includes a negative energy balance of [phone] kcal/d;Understanding recommendations for meals to include 15-35% energy as protein, 25-35% energy from fat, 35-60% energy from carbohydrates, less than 200mg  of dietary cholesterol, 20-35 gm of total fiber daily;Understanding of distribution of calorie intake throughout the day with the consumption of 4-5 meals/snacks    Diabetes Yes    Intervention Provide education about signs/symptoms and action to take for hypo/hyperglycemia.;Provide education about proper nutrition, including hydration, and  aerobic/resistive exercise prescription along with prescribed medications to achieve blood glucose in normal ranges: Fasting glucose 65-99 mg/dL    Expected Outcomes Short Term: Participant verbalizes understanding of the signs/symptoms and immediate care of hyper/hypoglycemia, proper foot care and importance of medication, aerobic/resistive exercise and nutrition plan for blood glucose control.;Long Term: Attainment of HbA1C < 7%.    Hypertension Yes    Intervention Provide education on lifestyle modifcations including regular physical activity/exercise, weight management, moderate sodium restriction and increased consumption of fresh fruit, vegetables, and low fat dairy, alcohol moderation, and smoking cessation.;Monitor prescription use compliance.    Expected Outcomes Short Term: Continued assessment and intervention until BP is < 140/23mm HG in hypertensive participants. < 130/28mm HG in hypertensive participants with diabetes, heart failure or chronic kidney disease.;Long Term: Maintenance of blood pressure at goal levels.    Lipids Yes    Intervention Provide education and support for participant on nutrition & aerobic/resistive exercise along with prescribed medications to achieve LDL 70mg , HDL >40mg .    Expected Outcomes Short Term: Participant states understanding of desired cholesterol values and is compliant with medications prescribed. Participant is following exercise prescription and nutrition guidelines.;Long Term: Cholesterol controlled with medications as prescribed, with individualized exercise RX and with personalized nutrition plan. Value goals: LDL < 70mg , HDL > 40 mg.             Tobacco Use Initial Evaluation: Social History   Tobacco Use  Smoking Status Former   Current packs/day: 0.00   Types: Cigarettes   Quit date: 05/29/1993   Years since quitting: 30.0  Smokeless Tobacco Former   Types: Snuff, Chew   Quit date: 05/29/1978    Exercise Goals and Review:   Exercise Goals     Row Name 05/31/23 1622             Exercise Goals   Increase Physical Activity Yes       Intervention Provide advice, education, support and counseling about physical activity/exercise needs.;Develop an individualized exercise prescription for aerobic and resistive training based on initial evaluation findings, risk stratification, comorbidities and participant's personal goals.       Expected Outcomes Long Term: Add in home exercise to make exercise part of routine and to increase amount of physical activity.;Short Term: Attend rehab on a regular basis to increase amount of physical activity.;Long Term: Exercising regularly at least 3-5 days a week.       Increase Strength and Stamina Yes       Intervention Provide advice, education, support and counseling about physical activity/exercise needs.;Develop an individualized exercise prescription for aerobic and resistive training based on initial evaluation findings, risk stratification, comorbidities and participant's personal goals.       Expected Outcomes Short Term: Increase workloads from initial exercise prescription for resistance, speed, and METs.;Short Term: Perform resistance training exercises routinely during rehab and add in resistance training at home;Long Term: Improve cardiorespiratory fitness,  muscular endurance and strength as measured by increased METs and functional capacity ( )       Able to understand and use rate of perceived exertion (RPE) scale Yes       Intervention Provide education and explanation on how to use RPE scale       Expected Outcomes Long Term:  Able to use RPE to guide intensity level when exercising independently;Short Term: Able to use RPE daily in rehab to express subjective intensity level       Able to understand and use Dyspnea scale Yes       Intervention Provide education and explanation on how to use Dyspnea scale       Expected Outcomes Short Term: Able to use Dyspnea scale  daily in rehab to express subjective sense of shortness of breath during exertion;Long Term: Able to use Dyspnea scale to guide intensity level when exercising independently       Knowledge and understanding of Target Heart Rate Range (THRR) Yes       Intervention Provide education and explanation of THRR including how the numbers were predicted and where they are located for reference       Expected Outcomes Short Term: Able to state/look up THRR;Long Term: Able to use THRR to govern intensity when exercising independently;Short Term: Able to use daily as guideline for intensity in rehab       Able to check pulse independently Yes       Intervention Provide education and demonstration on how to check pulse in carotid and radial arteries.;Review the importance of being able to check your own pulse for safety during independent exercise       Expected Outcomes Short Term: Able to explain why pulse checking is important during independent exercise;Long Term: Able to check pulse independently and accurately       Understanding of Exercise Prescription Yes       Intervention Provide education, explanation, and written materials on patient's individual exercise prescription       Expected Outcomes Short Term: Able to explain program exercise prescription;Long Term: Able to explain home exercise prescription to exercise independently

## 2023-06-06 ENCOUNTER — Encounter: Payer: Medicare Other | Admitting: *Deleted

## 2023-06-06 DIAGNOSIS — Z955 Presence of coronary angioplasty implant and graft: Secondary | ICD-10-CM

## 2023-06-06 LAB — GLUCOSE, CAPILLARY
Glucose-Capillary: 121 mg/dL — ABNORMAL HIGH (ref 70–99)
Glucose-Capillary: 126 mg/dL — ABNORMAL HIGH (ref 70–99)

## 2023-06-06 NOTE — Progress Notes (Signed)
 Daily Session Note  Patient Details  Name: Frank Wong MRN: [id number] Date of Birth: 1955/08/15 Referring Provider:   Flowsheet Row Cardiac Rehab from 05/31/2023 in [facility] Cardiac and Pulmonary Rehab  Referring Provider Ammon Blunt, MD       Encounter Date: 06/06/2023  Check In:  Session Check In - 06/06/23 1404       Check-In   Supervising physician immediately available to respond to emergencies See telemetry face sheet for immediately available ER MD    Location [facility]-Cardiac & Pulmonary Rehab    Staff Present Hoy Rodney, RN Monico Hint, BS, ACSM CEP, Exercise Physiologist;Megan Claudene, RN, ADN;Susanne Bice, RN, BSN, CCRP    Virtual Visit No    Medication changes reported     No    Fall or balance concerns reported    No    Warm-up and Cool-down Performed on first and last piece of equipment    Resistance Training Performed Yes    VAD Patient? No    PAD/SET Patient? No      Pain Assessment   Currently in Pain? No/denies                Social History   Tobacco Use  Smoking Status Former   Current packs/day: 0.00   Types: Cigarettes   Quit date: 05/29/1993   Years since quitting: 30.0  Smokeless Tobacco Former   Types: Snuff, Chew   Quit date: 05/29/1978    Goals Met:  Independence with exercise equipment Exercise tolerated well No report of concerns or symptoms today Strength training completed today  Goals Unmet:  Not Applicable  Comments: First full day of exercise!  Patient was oriented to gym and equipment including functions, settings, policies, and procedures.  Patient's individual exercise prescription and treatment plan were reviewed.  All starting workloads were established based on the results of the 6 minute walk test done at initial orientation visit.  The plan for exercise progression was also introduced and progression will be customized based on patient's performance and goals.   Dr. Oneil Pinal is Medical Director for  [facility] Cardiac Rehabilitation.  Dr. Fuad Aleskerov is Medical Director for [facility] Pulmonary Rehabilitation.

## 2023-06-07 ENCOUNTER — Encounter: Payer: Medicare Other | Admitting: *Deleted

## 2023-06-07 DIAGNOSIS — Z955 Presence of coronary angioplasty implant and graft: Secondary | ICD-10-CM

## 2023-06-07 LAB — GLUCOSE, CAPILLARY
Glucose-Capillary: 130 mg/dL — ABNORMAL HIGH (ref 70–99)
Glucose-Capillary: 110 mg/dL — ABNORMAL HIGH (ref 70–99)

## 2023-06-07 NOTE — Progress Notes (Signed)
 Daily Session Note  Patient Details  Name: Frank Wong MRN: [id number] Date of Birth: December 24, 1955 Referring Provider:   Flowsheet Row Cardiac Rehab from 05/31/2023 in [facility] Cardiac and Pulmonary Rehab  Referring Provider Ammon Blunt, MD       Encounter Date: 06/07/2023  Check In:  Session Check In - 06/07/23 1450       Check-In   Supervising physician immediately available to respond to emergencies See telemetry face sheet for immediately available ER MD    Location [facility]-Cardiac & Pulmonary Rehab    Staff Present Devaughn Jaeger, BS, Exercise Physiologist;Meredith Tressa, RN BSN;Joseph Rolinda [place] Arzella Claudene, RN, [place]    Virtual Visit No    Medication changes reported     No    Fall or balance concerns reported    No    Warm-up and Cool-down Performed on first and last piece of equipment    Resistance Training Performed Yes    VAD Patient? No    PAD/SET Patient? No      Pain Assessment   Currently in Pain? No/denies                Social History   Tobacco Use  Smoking Status Former   Current packs/day: 0.00   Types: Cigarettes   Quit date: 05/29/1993   Years since quitting: 30.0  Smokeless Tobacco Former   Types: Snuff, Chew   Quit date: 05/29/1978    Goals Met:  Independence with exercise equipment Exercise tolerated well No report of concerns or symptoms today Strength training completed today  Goals Unmet:  Not Applicable  Comments: Pt able to follow exercise prescription today without complaint.  Will continue to monitor for progression.    Dr. Oneil Pinal is Medical Director for [facility] Cardiac Rehabilitation.  Dr. Fuad Aleskerov is Medical Director for [facility] Pulmonary Rehabilitation.

## 2023-06-11 ENCOUNTER — Encounter: Payer: Medicare Other | Admitting: *Deleted

## 2023-06-11 DIAGNOSIS — Z955 Presence of coronary angioplasty implant and graft: Secondary | ICD-10-CM

## 2023-06-11 LAB — GLUCOSE, CAPILLARY
Glucose-Capillary: 105 mg/dL — ABNORMAL HIGH (ref 70–99)
Glucose-Capillary: 94 mg/dL (ref 70–99)

## 2023-06-11 NOTE — Progress Notes (Signed)
 Daily Session Note  Patient Details  Name: Frank Wong MRN: [id number] Date of Birth: 1955/08/30 Referring Provider:   Flowsheet Row Cardiac Rehab from 05/31/2023 in [facility] Cardiac and Pulmonary Rehab  Referring Provider Ammon Blunt, MD       Encounter Date: 06/11/2023  Check In:  Session Check In - 06/11/23 1403       Check-In   Supervising physician immediately available to respond to emergencies See telemetry face sheet for immediately available ER MD    Location [facility]-Cardiac & Pulmonary Rehab    Staff Present Hoy Rodney, RN BSN;Joseph Rolinda, RCP,RRT,BSRT;Maxon [place], , Exercise Physiologist;Laureen Delores, BS, RRT, CPFT    Virtual Visit No    Medication changes reported     No    Fall or balance concerns reported    No    Warm-up and Cool-down Performed on first and last piece of equipment    Resistance Training Performed Yes    VAD Patient? No    PAD/SET Patient? No      Pain Assessment   Currently in Pain? No/denies                Social History   Tobacco Use  Smoking Status Former   Current packs/day: 0.00   Types: Cigarettes   Quit date: 05/29/1993   Years since quitting: 30.0  Smokeless Tobacco Former   Types: Snuff, Chew   Quit date: 05/29/1978    Goals Met:  Independence with exercise equipment Exercise tolerated well No report of concerns or symptoms today Strength training completed today  Goals Unmet:  Not Applicable  Comments: Pt able to follow exercise prescription today without complaint.  Will continue to monitor for progression.    Dr. Oneil Pinal is Medical Director for [facility] Cardiac Rehabilitation.  Dr. Fuad Aleskerov is Medical Director for [facility] Pulmonary Rehabilitation.

## 2023-06-13 ENCOUNTER — Encounter: Payer: Medicare Other | Admitting: *Deleted

## 2023-06-13 DIAGNOSIS — Z955 Presence of coronary angioplasty implant and graft: Secondary | ICD-10-CM

## 2023-06-13 DIAGNOSIS — Z951 Presence of aortocoronary bypass graft: Secondary | ICD-10-CM

## 2023-06-13 NOTE — Progress Notes (Addendum)
 Cardiac Individual Treatment Plan  Patient Details  Name: Frank Wong MRN: [id number] Date of Birth: 03-27-1956 Referring Provider:   Flowsheet Row Cardiac Rehab from 05/31/2023 in [facility] Cardiac and Pulmonary Rehab  Referring Provider Frank Brace, MD       Initial Encounter Date:  Flowsheet Row Cardiac Rehab from 05/31/2023 in [facility] Cardiac and Pulmonary Rehab  Date 05/31/23       Visit Diagnosis: Status post coronary artery stent placement  S/P CABG x 2  Patient's Home Medications on Admission:  Current Outpatient Medications:    acidophilus (RISAQUAD) CAPS capsule, Take 1 capsule by mouth daily., Disp: , Rfl:    amphetamine-dextroamphetamine (ADDERALL XR) 10 MG 24 hr capsule, Take 10 mg by mouth daily., Disp: , Rfl:    aspirin  EC 81 MG tablet, Take 81 mg by mouth in the morning., Disp: , Rfl:    atorvastatin  (LIPITOR ) 80 MG tablet, Take 80 mg by mouth in the morning., Disp: , Rfl:    B Complex-C (B-COMPLEX WITH VITAMIN C) tablet, Take 1 tablet by mouth in the morning. (Patient not taking: Reported on 05/24/2023), Disp: , Rfl:    Cholecalciferol (VITAMIN D-1000 MAX ST) 25 MCG (1000 UT) tablet, Take 1,000 Units by mouth daily. (Patient not taking: Reported on 05/24/2023), Disp: , Rfl:    clonazePAM  (KLONOPIN ) 2 MG tablet, Take 1 mg by mouth at bedtime as needed (sleep/ anxiety)., Disp: , Rfl:    Dulaglutide (TRULICITY) 1.5 MG/0.5ML SOPN, Inject 0.5 mLs into the skin every Sunday., Disp: , Rfl:    fexofenadine (ALLEGRA) 180 MG tablet, Take 180 mg by mouth in the morning., Disp: , Rfl:    Fluticasone -Umeclidin-Vilant 100-62.5-25 MCG/ACT AEPB, Inhale 1 puff into the lungs daily., Disp: , Rfl:    gabapentin  (NEURONTIN ) 600 MG tablet, Take 600 mg by mouth 2 (two) times daily., Disp: , Rfl:    glipiZIDE  (GLUCOTROL  XL) 2.5 MG 24 hr tablet, Take 2.5 mg by mouth daily., Disp: , Rfl:    isosorbide mononitrate (IMDUR) 30 MG 24 hr tablet, Take 30 mg by mouth daily., Disp: , Rfl:     JARDIANCE  25 MG TABS tablet, Take 25 mg by mouth in the morning., Disp: , Rfl:    levalbuterol (XOPENEX HFA) 45 MCG/ACT inhaler, Inhale 1-2 puffs into the lungs every 6 (six) hours as needed for wheezing or shortness of breath., Disp: , Rfl:    levocetirizine (XYZAL ) 5 MG tablet, Take 5 mg by mouth every evening., Disp: , Rfl:    lisinopril (ZESTRIL) 2.5 MG tablet, Take 2.5 mg by mouth daily., Disp: , Rfl:    magnesium  oxide (MAG-OX) 400 MG tablet, Take 400 mg by mouth at bedtime. (Patient not taking: Reported on 05/24/2023), Disp: , Rfl:    metFORMIN (GLUCOPHAGE) 1000 MG tablet, Take 1,000 mg by mouth 2 (two) times daily with a meal. , Disp: , Rfl:    montelukast  (SINGULAIR ) 10 MG tablet, Take 10 mg by mouth at bedtime., Disp: , Rfl:    Multiple Vitamin (MULTIVITAMIN WITH MINERALS) TABS tablet, Take 1 tablet by mouth in the morning., Disp: , Rfl:    nitroGLYCERIN  (NITROSTAT ) 0.4 MG SL tablet, Place 0.4 mg under the tongue every 5 (five) minutes as needed for chest pain., Disp: , Rfl:    NON FORMULARY, Pt uses a bi-pap nightly, Disp: , Rfl:    pantoprazole  (PROTONIX ) 40 MG tablet, Take 40 mg by mouth at bedtime., Disp: , Rfl:    prasugrel  (EFFIENT ) 10 MG TABS tablet,  Take 1 tablet (10 mg total) by mouth daily., Disp: 30 tablet, Rfl: 0   sertraline (ZOLOFT) 25 MG tablet, Take 25 mg by mouth daily., Disp: , Rfl:    Testosterone  1.62 % GEL, APPLY TWO (2) PUMPS TOPICALLY IN THE MORNING TO THE SHOULDERS AS DIRECTED (Patient not taking: Reported on 04/25/2023), Disp: 75 g, Rfl: 2   traZODone (DESYREL) 50 MG tablet, Take 50 mg by mouth at bedtime., Disp: , Rfl:   Past Medical History: Past Medical History:  Diagnosis Date   Alcoholism (HCC)    Asthma    CAD (coronary artery disease)    Cardiac arrest (HCC)    Chronic cardiac arrhythmia    Chronic kidney disease    COPD (chronic obstructive pulmonary disease) (HCC)    Depression    Diabetes mellitus without complication (HCC)    Essential tremor     GERD (gastroesophageal reflux disease)    Heart attack (HCC)    Heart murmur    Hyperlipidemia    Hypertension    IBS (irritable bowel syndrome)    Shoulder problem 2010   right   Sinoatrial node dysfunction (HCC)    Sleep apnea     Tobacco Use: Social History   Tobacco Use  Smoking Status Former   Current packs/day: 0.00   Types: Cigarettes   Quit date: 05/29/1993   Years since quitting: 30.0  Smokeless Tobacco Former   Types: Snuff, Chew   Quit date: 05/29/1978    Labs: Review Flowsheet        No data to display           Exercise Target Goals: Exercise Program Goal: Individual exercise prescription set using results from initial 6 min walk test and THRR while considering  patient's activity barriers and safety.   Exercise Prescription Goal: Initial exercise prescription builds to 30-45 minutes a day of aerobic activity, 2-3 days per week.  Home exercise guidelines will be given to patient during program as part of exercise prescription that the participant will acknowledge.   Education: Aerobic Exercise: - Group verbal and visual presentation on the components of exercise prescription. Introduces F.I.T.T principle from ACSM for exercise prescriptions.  Reviews F.I.T.T. principles of aerobic exercise including progression. Written material given at graduation. Flowsheet Row Cardiac Rehab from 10/19/2021 in [facility] Cardiac and Pulmonary Rehab  Date 09/28/21  Educator [facility]  Instruction Review Code 1- Verbalizes Understanding       Education: Resistance Exercise: - Group verbal and visual presentation on the components of exercise prescription. Introduces F.I.T.T principle from ACSM for exercise prescriptions  Reviews F.I.T.T. principles of resistance exercise including progression. Written material given at graduation. Flowsheet Row Cardiac Rehab from 10/19/2021 in [facility] Cardiac and Pulmonary Rehab  Date 10/05/21  Educator [facility]  Instruction Review Code 1- Verbalizes  Understanding        Education: Exercise & Equipment Safety: - Individual verbal instruction and demonstration of equipment use and safety with use of the equipment. Flowsheet Row Cardiac Rehab from 05/31/2023 in [facility] Cardiac and Pulmonary Rehab  Date 05/31/23  Educator [facility]  Instruction Review Code 1- Verbalizes Understanding       Education: Exercise Physiology & General Exercise Guidelines: - Group verbal and written instruction with models to review the exercise physiology of the cardiovascular system and associated critical values. Provides general exercise guidelines with specific guidelines to those with heart or lung disease.  Flowsheet Row Cardiac Rehab from 10/19/2021 in [facility] Cardiac and Pulmonary Rehab  Date 09/21/21  Educator [facility]  Instruction Review Code 1- Verbalizes Understanding       Education: Flexibility, Balance, Mind/Body Relaxation: - Group verbal and visual presentation with interactive activity on the components of exercise prescription. Introduces F.I.T.T principle from ACSM for exercise prescriptions. Reviews F.I.T.T. principles of flexibility and balance exercise training including progression. Also discusses the mind body connection.  Reviews various relaxation techniques to help reduce and manage stress (i.e. Deep breathing, progressive muscle relaxation, and visualization). Balance handout provided to take home. Written material given at graduation. Flowsheet Row Cardiac Rehab from 12/29/2020 in [facility] Cardiac and Pulmonary Rehab  Date 12/01/20  Educator [facility]  Instruction Review Code 1- Verbalizes Understanding       Activity Barriers & Risk Stratification:  Activity Barriers & Cardiac Risk Stratification - 05/31/23 1614       Activity Barriers & Cardiac Risk Stratification   Activity Barriers Shortness of Breath;Joint Problems   has COPD; joint issues: bilateral shoulders, bilateral hips, bilateral knees; L shoulder surgery scheduled   Cardiac Risk  Stratification Moderate             6 Minute Walk:  6 Minute Walk     Row Name 05/31/23 1613         6 Minute Walk   Phase Initial     Distance 1705 feet     Walk Time 6 minutes     # of Rest Breaks 0     MPH 3.23     METS 3.76     RPE 8     Perceived Dyspnea  1     VO2 Peak 13.14     Symptoms No     Resting HR 80 bpm     Resting BP 100/60     Resting Oxygen Saturation  93 %     Exercise Oxygen Saturation  during 6 min walk 97 %     Max Ex. HR 108 bpm     Max Ex. BP 100/60     2 Minute Post BP 98/60              Oxygen Initial Assessment:   Oxygen Re-Evaluation:   Oxygen Discharge (Final Oxygen Re-Evaluation):   Initial Exercise Prescription:  Initial Exercise Prescription - 05/31/23 1600       Date of Initial Exercise RX and Referring Provider   Date 05/31/23    Referring Provider Frank Brace, MD      Oxygen   Maintain Oxygen Saturation 88% or higher      Treadmill   MPH 3.3    Grade 0.5    Minutes 15    METs 3.75      Elliptical   Level 3    Speed 3    Minutes 15    METs 3.76      REL-XR   Level 3    Speed 50    Minutes 15    METs 3.76      Prescription Details   Frequency (times per week) 3    Duration Progress to 30 minutes of continuous aerobic without signs/symptoms of physical distress      Intensity   THRR 40-80% of Max Heartrate 110-140    Ratings of Perceived Exertion 11-13    Perceived Dyspnea 0-4      Progression   Progression Continue to progress workloads to maintain intensity without signs/symptoms of physical distress.      Resistance Training   Training Prescription Yes    Weight 7 lb  Reps 10-15             Perform Capillary Blood Glucose checks as needed.  Exercise Prescription Changes:   Exercise Prescription Changes     Row Name 05/31/23 1600             Response to Exercise   Blood Pressure (Admit) 100/60       Blood Pressure (Exercise) 100/60       Blood Pressure  (Exit) 98/60       Heart Rate (Admit) 80 bpm       Heart Rate (Exercise) 108 bpm       Heart Rate (Exit) 86 bpm       Oxygen Saturation (Admit) 93 %       Oxygen Saturation (Exercise) 97 %       Oxygen Saturation (Exit) 96 %       Rating of Perceived Exertion (Exercise) 8       Perceived Dyspnea (Exercise) 1       Symptoms none       Comments results       Duration Progress to 30 minutes of  aerobic without signs/symptoms of physical distress       Intensity THRR New         Progression   Progression Continue to progress workloads to maintain intensity without signs/symptoms of physical distress.       Average METs 3.76                Exercise Comments:   Exercise Comments     Row Name 06/06/23 1405           Exercise Comments First full day of exercise!  Patient was oriented to gym and equipment including functions, settings, policies, and procedures.  Patient's individual exercise prescription and treatment plan were reviewed.  All starting workloads were established based on the results of the 6 minute walk test done at initial orientation visit.  The plan for exercise progression was also introduced and progression will be customized based on patient's performance and goals.                Exercise Goals and Review:   Exercise Goals     Row Name 05/31/23 1622             Exercise Goals   Increase Physical Activity Yes       Intervention Provide advice, education, support and counseling about physical activity/exercise needs.;Develop an individualized exercise prescription for aerobic and resistive training based on initial evaluation findings, risk stratification, comorbidities and participant's personal goals.       Expected Outcomes Long Term: Add in home exercise to make exercise part of routine and to increase amount of physical activity.;Short Term: Attend rehab on a regular basis to increase amount of physical activity.;Long Term: Exercising  regularly at least 3-5 days a week.       Increase Strength and Stamina Yes       Intervention Provide advice, education, support and counseling about physical activity/exercise needs.;Develop an individualized exercise prescription for aerobic and resistive training based on initial evaluation findings, risk stratification, comorbidities and participant's personal goals.       Expected Outcomes Short Term: Increase workloads from initial exercise prescription for resistance, speed, and METs.;Short Term: Perform resistance training exercises routinely during rehab and add in resistance training at home;Long Term: Improve cardiorespiratory fitness, muscular endurance and strength as measured by increased METs and functional capacity ( )  Able to understand and use rate of perceived exertion (RPE) scale Yes       Intervention Provide education and explanation on how to use RPE scale       Expected Outcomes Long Term:  Able to use RPE to guide intensity level when exercising independently;Short Term: Able to use RPE daily in rehab to express subjective intensity level       Able to understand and use Dyspnea scale Yes       Intervention Provide education and explanation on how to use Dyspnea scale       Expected Outcomes Short Term: Able to use Dyspnea scale daily in rehab to express subjective sense of shortness of breath during exertion;Long Term: Able to use Dyspnea scale to guide intensity level when exercising independently       Knowledge and understanding of Target Heart Rate Range (THRR) Yes       Intervention Provide education and explanation of THRR including how the numbers were predicted and where they are located for reference       Expected Outcomes Short Term: Able to state/look up THRR;Long Term: Able to use THRR to govern intensity when exercising independently;Short Term: Able to use daily as guideline for intensity in rehab       Able to check pulse independently Yes        Intervention Provide education and demonstration on how to check pulse in carotid and radial arteries.;Review the importance of being able to check your own pulse for safety during independent exercise       Expected Outcomes Short Term: Able to explain why pulse checking is important during independent exercise;Long Term: Able to check pulse independently and accurately       Understanding of Exercise Prescription Yes       Intervention Provide education, explanation, and written materials on patient's individual exercise prescription       Expected Outcomes Short Term: Able to explain program exercise prescription;Long Term: Able to explain home exercise prescription to exercise independently                Exercise Goals Re-Evaluation :  Exercise Goals Re-Evaluation     Row Name 06/06/23 1406             Exercise Goal Re-Evaluation   Exercise Goals Review Able to understand and use rate of perceived exertion (RPE) scale;Increase Physical Activity;Knowledge and understanding of Target Heart Rate Range (THRR);Understanding of Exercise Prescription;Increase Strength and Stamina;Able to check pulse independently       Comments Reviewed RPE and dyspnea scale, THR and program prescription with pt today.  Pt voiced understanding and was given a copy of goals to take home.       Expected Outcomes Short: Use RPE daily to regulate intensity.  Long: Follow program prescription in THR.                Discharge Exercise Prescription (Final Exercise Prescription Changes):  Exercise Prescription Changes - 05/31/23 1600       Response to Exercise   Blood Pressure (Admit) 100/60    Blood Pressure (Exercise) 100/60    Blood Pressure (Exit) 98/60    Heart Rate (Admit) 80 bpm    Heart Rate (Exercise) 108 bpm    Heart Rate (Exit) 86 bpm    Oxygen Saturation (Admit) 93 %    Oxygen Saturation (Exercise) 97 %    Oxygen Saturation (Exit) 96 %    Rating of Perceived Exertion (Exercise) 8  Perceived Dyspnea (Exercise) 1    Symptoms none    Comments results    Duration Progress to 30 minutes of  aerobic without signs/symptoms of physical distress    Intensity THRR New      Progression   Progression Continue to progress workloads to maintain intensity without signs/symptoms of physical distress.    Average METs 3.76             Nutrition:  Target Goals: Understanding of nutrition guidelines, daily intake of sodium 1500mg , cholesterol 200mg , calories 30% from fat and 7% or less from saturated fats, daily to have 5 or more servings of fruits and vegetables.  Education: All About Nutrition: -Group instruction provided by verbal, written material, interactive activities, discussions, models, and posters to present general guidelines for heart healthy nutrition including fat, fiber, MyPlate, the role of sodium in heart healthy nutrition, utilization of the nutrition label, and utilization of this knowledge for meal planning. Follow up email sent as well. Written material given at graduation. Flowsheet Row Cardiac Rehab from 05/31/2023 in [facility] Cardiac and Pulmonary Rehab  Education need identified 05/31/23       Biometrics:  Pre Biometrics - 05/31/23 1623       Pre Biometrics   Height 5' 7.5" (1.715 m)    Weight 173 lb 9.6 oz (78.7 kg)    Waist Circumference 39 inches    Hip Circumference 36.5 inches    Waist to Hip Ratio 1.07 %    BMI (Calculated) 26.77    Single Leg Stand 19.3 seconds              Nutrition Therapy Plan and Nutrition Goals:  Nutrition Therapy & Goals - 05/31/23 1625       Nutrition Therapy   RD appointment deferred Yes      Personal Nutrition Goals   Nutrition Goal RD appointment deferred      Intervention Plan   Intervention Prescribe, educate and counsel regarding individualized specific dietary modifications aiming towards targeted core components such as weight, hypertension, lipid management, diabetes, heart failure and  other comorbidities.    Expected Outcomes Short Term Goal: Understand basic principles of dietary content, such as calories, fat, sodium, cholesterol and nutrients.             Nutrition Assessments:  MEDIFICTS Score Key: >=70 Need to make dietary changes  40-70 Heart Healthy Diet <= 40 Therapeutic Level Cholesterol Diet  Flowsheet Row Cardiac Rehab from 05/31/2023 in [facility] Cardiac and Pulmonary Rehab  Picture Your Plate Total Score on Admission 67      Picture Your Plate Scores: <69 Unhealthy dietary pattern with much room for improvement. 41-50 Dietary pattern unlikely to meet recommendations for good health and room for improvement. 51-60 More healthful dietary pattern, with some room for improvement.  >60 Healthy dietary pattern, although there may be some specific behaviors that could be improved.    Nutrition Goals Re-Evaluation:   Nutrition Goals Discharge (Final Nutrition Goals Re-Evaluation):   Psychosocial: Target Goals: Acknowledge presence or absence of significant depression and/or stress, maximize coping skills, provide positive support system. Participant is able to verbalize types and ability to use techniques and skills needed for reducing stress and depression.   Education: Stress, Anxiety, and Depression - Group verbal and visual presentation to define topics covered.  Reviews how body is impacted by stress, anxiety, and depression.  Also discusses healthy ways to reduce stress and to treat/manage anxiety and depression.  Written material given at graduation.  Flowsheet Row Cardiac Rehab from 10/19/2021 in [facility] Cardiac and Pulmonary Rehab  Date 09/14/21  Educator [facility]  Instruction Review Code 1- [employer] Understanding       Education: Sleep Hygiene -Provides group verbal and written instruction about how sleep can affect your health.  Define sleep hygiene, discuss sleep cycles and impact of sleep habits. Review good sleep hygiene tips.    Initial  Review & Psychosocial Screening:  Initial Psych Review & Screening - 05/24/23 1444       Initial Review   Current issues with Current Depression;Current Psychotropic Meds      Family Dynamics   Good Support System? Yes   wife and children     Barriers   Psychosocial barriers to participate in program There are no identifiable barriers or psychosocial needs.      Screening Interventions   Interventions Encouraged to exercise;To provide support and resources with identified psychosocial needs;Provide feedback about the scores to participant    Expected Outcomes Short Term goal: Utilizing psychosocial counselor, staff and physician to assist with identification of specific Stressors or current issues interfering with healing process. Setting desired goal for each stressor or current issue identified.;Long Term Goal: Stressors or current issues are controlled or eliminated.;Short Term goal: Identification and review with participant of any Quality of Life or Depression concerns found by scoring the questionnaire.;Long Term goal: The participant improves quality of Life and PHQ9 Scores as seen by post scores and/or verbalization of changes             Quality of Life Scores:   Quality of Life - 05/31/23 1624       Quality of Life   Select Quality of Life      Quality of Life Scores   Health/Function Pre 25.8 %    Socioeconomic Pre 27.38 %    Psych/Spiritual Pre 28.43 %    Family Pre 27.6 %    GLOBAL Pre 26.94 %            Scores of 19 and below usually indicate a poorer quality of life in these areas.  A difference of  2-3 points is a clinically meaningful difference.  A difference of 2-3 points in the total score of the Quality of Life Index has been associated with significant improvement in overall quality of life, self-image, physical symptoms, and general health in studies assessing change in quality of life.  PHQ-9: Review Flowsheet  More data may exist      05/31/2023  10/21/2021 08/08/2021 01/05/2021 11/01/2020  Depression screen PHQ 2/9  Decreased Interest 1 1 0 0 0  Down, Depressed, Hopeless 0 1 0 1 0  PHQ - 2 Score 1 2 0 1 0  Altered sleeping 2 1 0 1 1  Tired, decreased energy 1 1 1 1 1   Change in appetite 1 1 1  0 0  Feeling bad or failure about yourself  0 0 0 0 0  Trouble concentrating 2 2 2 1 1   Moving slowly or fidgety/restless 1 2 0 0 0  Suicidal thoughts 0 0 0 0 0  PHQ-9 Score 8 9 4 4 3   Difficult doing work/chores Not difficult at all Not difficult at all Somewhat difficult Not difficult at all Somewhat difficult   Interpretation of Total Score  Total Score Depression Severity:  1-4 = Minimal depression, 5-9 = Mild depression, 10-14 = Moderate depression, 15-19 = Moderately severe depression, 20-27 = Severe depression   Psychosocial Evaluation and Intervention:  Psychosocial  Evaluation - 05/24/23 1454       Psychosocial Evaluation & Interventions   Comments Toye is returning to the program after cardiac stent placement. He is ready to complete the program without any barriers. HIs support system is his wife and children.  He is on Zoloft for depression and the meds help control his symptoms.  He is ready to get started.    Expected Outcomes Short: attend cardiac rehab for education and exercise. Long: develop and maintain positive self care habits.    Continue Psychosocial Services  Follow up required by staff             Psychosocial Re-Evaluation:   Psychosocial Discharge (Final Psychosocial Re-Evaluation):   Vocational Rehabilitation: Provide vocational rehab assistance to qualifying candidates.   Vocational Rehab Evaluation & Intervention:  Vocational Rehab - 05/24/23 1456       Initial Vocational Rehab Evaluation & Intervention   Assessment shows need for Vocational Rehabilitation No      Vocational Rehab Re-Evaulation   Comments no request for VR             Education: Education Goals: Education classes will  be provided on a variety of topics geared toward better understanding of heart health and risk factor modification. Participant will state understanding/return demonstration of topics presented as noted by education test scores.  Learning Barriers/Preferences:   General Cardiac Education Topics:  AED/CPR: - Group verbal and written instruction with the use of models to demonstrate the basic use of the AED with the basic ABC's of resuscitation.   Anatomy and Cardiac Procedures: - Group verbal and visual presentation and models provide information about basic cardiac anatomy and function. Reviews the testing methods done to diagnose heart disease and the outcomes of the test results. Describes the treatment choices: Medical Management, Angioplasty, or Coronary Bypass Surgery for treating various heart conditions including Myocardial Infarction, Angina, Valve Disease, and Cardiac Arrhythmias.  Written material given at graduation. Flowsheet Row Cardiac Rehab from 10/19/2021 in [facility] Cardiac and Pulmonary Rehab  Education need identified 08/08/21  Date 10/05/21  Educator SB  Instruction Review Code 1- Verbalizes Understanding       Medication Safety: - Group verbal and visual instruction to review commonly prescribed medications for heart and lung disease. Reviews the medication, class of the drug, and side effects. Includes the steps to properly store meds and maintain the prescription regimen.  Written material given at graduation. Flowsheet Row Cardiac Rehab from 10/19/2021 in [facility] Cardiac and Pulmonary Rehab  Education need identified 08/08/21  Date 08/24/21  Educator SB  Instruction Review Code 1- Verbalizes Understanding       Intimacy: - Group verbal instruction through game format to discuss how heart and lung disease can affect sexual intimacy. Written material given at graduation.. Flowsheet Row Cardiac Rehab from 10/19/2021 in [facility] Cardiac and Pulmonary Rehab  Date 09/28/21   Educator [facility]  Instruction Review Code 1- Verbalizes Understanding       Know Your Numbers and Heart Failure: - Group verbal and visual instruction to discuss disease risk factors for cardiac and pulmonary disease and treatment options.  Reviews associated critical values for Overweight/Obesity, Hypertension, Cholesterol, and Diabetes.  Discusses basics of heart failure: signs/symptoms and treatments.  Introduces Heart Failure Zone chart for action plan for heart failure.  Written material given at graduation. Flowsheet Row Cardiac Rehab from 12/29/2020 in [facility] Cardiac and Pulmonary Rehab  Date 12/22/20  Educator SB  Instruction Review Code 1- Verbalizes Understanding  Infection Prevention: - Provides verbal and written material to individual with discussion of infection control including proper hand washing and proper equipment cleaning during exercise session. Flowsheet Row Cardiac Rehab from 05/31/2023 in [facility] Cardiac and Pulmonary Rehab  Date 05/31/23  Educator MB  Instruction Review Code 1- Verbalizes Understanding       Falls Prevention: - Provides verbal and written material to individual with discussion of falls prevention and safety. Flowsheet Row Cardiac Rehab from 05/31/2023 in [facility] Cardiac and Pulmonary Rehab  Date 05/31/23  Educator MB  Instruction Review Code 1- Verbalizes Understanding       Other: -Provides group and verbal instruction on various topics (see comments)   Knowledge Questionnaire Score:  Knowledge Questionnaire Score - 05/31/23 1628       Knowledge Questionnaire Score   Pre Score 25/26             Core Components/Risk Factors/Patient Goals at Admission:  Personal Goals and Risk Factors at Admission - 05/31/23 1628       Core Components/Risk Factors/Patient Goals on Admission    Weight Management Yes;Weight Loss    Intervention Weight Management: Develop a combined nutrition and exercise program designed to reach desired caloric  intake, while maintaining appropriate intake of nutrient and fiber, sodium and fats, and appropriate energy expenditure required for the weight goal.;Weight Management: Provide education and appropriate resources to help participant work on and attain dietary goals.;Weight Management/Obesity: Establish reasonable short term and long term weight goals.    Admit Weight 173 lb 9.6 oz (78.7 kg)    Goal Weight: Short Term 170 lb (77.1 kg)    Goal Weight: Long Term 155 lb (70.3 kg)    Expected Outcomes Short Term: Continue to assess and modify interventions until short term weight is achieved;Long Term: Adherence to nutrition and physical activity/exercise program aimed toward attainment of established weight goal;Weight Loss: Understanding of general recommendations for a balanced deficit meal plan, which promotes 1-2 lb weight loss per week and includes a negative energy balance of [phone] kcal/d;Understanding recommendations for meals to include 15-35% energy as protein, 25-35% energy from fat, 35-60% energy from carbohydrates, less than 200mg  of dietary cholesterol, 20-35 gm of total fiber daily;Understanding of distribution of calorie intake throughout the day with the consumption of 4-5 meals/snacks    Diabetes Yes    Intervention Provide education about signs/symptoms and action to take for hypo/hyperglycemia.;Provide education about proper nutrition, including hydration, and aerobic/resistive exercise prescription along with prescribed medications to achieve blood glucose in normal ranges: Fasting glucose 65-99 mg/dL    Expected Outcomes Short Term: Participant verbalizes understanding of the signs/symptoms and immediate care of hyper/hypoglycemia, proper foot care and importance of medication, aerobic/resistive exercise and nutrition plan for blood glucose control.;Long Term: Attainment of HbA1C < 7%.    Hypertension Yes    Intervention Provide education on lifestyle modifcations including regular  physical activity/exercise, weight management, moderate sodium restriction and increased consumption of fresh fruit, vegetables, and low fat dairy, alcohol moderation, and smoking cessation.;Monitor prescription use compliance.    Expected Outcomes Short Term: Continued assessment and intervention until BP is < 140/73mm HG in hypertensive participants. < 130/67mm HG in hypertensive participants with diabetes, heart failure or chronic kidney disease.;Long Term: Maintenance of blood pressure at goal levels.    Lipids Yes    Intervention Provide education and support for participant on nutrition & aerobic/resistive exercise along with prescribed medications to achieve LDL 70mg , HDL >40mg .    Expected Outcomes Short Term: Participant  states understanding of desired cholesterol values and is compliant with medications prescribed. Participant is following exercise prescription and nutrition guidelines.;Long Term: Cholesterol controlled with medications as prescribed, with individualized exercise RX and with personalized nutrition plan. Value goals: LDL < 70mg , HDL > 40 mg.             Education:Diabetes - Individual verbal and written instruction to review signs/symptoms of diabetes, desired ranges of glucose level fasting, after meals and with exercise. Acknowledge that pre and post exercise glucose checks will be done for 3 sessions at entry of program. Flowsheet Row Cardiac Rehab from 05/31/2023 in [facility] Cardiac and Pulmonary Rehab  Date 05/31/23  Educator MB  Instruction Review Code 1- Verbalizes Understanding       Core Components/Risk Factors/Patient Goals Review:    Core Components/Risk Factors/Patient Goals at Discharge (Final Review):    ITP Comments:  ITP Comments     Row Name 05/24/23 1457 05/31/23 1611 06/06/23 1405 06/13/23 1255     ITP Comments Virtual orientation call completed today. he has an appointment on Date: 05/31/2023  for EP eval and gym Orientation.  Documentation of  diagnosis can be found in [facility] Date: 05/02/2023 . Completed and gym orientation. Initial ITP created and sent for review to Dr. Firman Hughes, Medical Director. First full day of exercise!  Patient was oriented to gym and equipment including functions, settings, policies, and procedures.  Patient's individual exercise prescription and treatment plan were reviewed.  All starting workloads were established based on the results of the 6 minute walk test done at initial orientation visit.  The plan for exercise progression was also introduced and progression will be customized based on patient's performance and goals. 30 Day review completed. Medical Director ITP review done, changes made as directed, and signed approval by Medical Director.             Comments: 30 day ITP.

## 2023-06-13 NOTE — Progress Notes (Signed)
 Daily Session Note  Patient Details  Name: Frank Wong MRN: [id number] Date of Birth: [birth year] Referring Provider:   Flowsheet Row Cardiac Rehab from 05/31/2023 in [facility] Cardiac and Pulmonary Rehab  Referring Provider Percival Brace, MD       Encounter Date: 06/13/2023  Check In:  Session Check In - 06/13/23 1428       Check-In   Supervising physician immediately available to respond to emergencies See telemetry face sheet for immediately available ER MD    Location [facility]-Cardiac & Pulmonary Rehab    Staff Present [name] BS, Exercise Physiologist;Laureen Bevin Bucks, BS, RRT, CPFT;Markevius Trombetta Manson Seitz RN,BSN;Joseph Hood RCP,RRT,BSRT    Virtual Visit No    Medication changes reported     No    Fall or balance concerns reported    No    Warm-up and Cool-down Performed on first and last piece of equipment    Resistance Training Performed Yes    VAD Patient? No    PAD/SET Patient? No      Pain Assessment   Currently in Pain? No/denies                Social History   Tobacco Use  Smoking Status Former   Current packs/day: 0.00   Types: Cigarettes   Quit date: 05/29/1993   Years since quitting: 30.0  Smokeless Tobacco Former   Types: Snuff, Chew   Quit date: 05/29/1978    Goals Met:  Independence with exercise equipment Exercise tolerated well No report of concerns or symptoms today Strength training completed today  Goals Unmet:  Not Applicable  Comments: Pt able to follow exercise prescription today without complaint.  Will continue to monitor for progression.    Dr. Firman Hughes is Medical Director for [facility] Cardiac Rehabilitation.  Dr. Fuad Aleskerov is Medical Director for [facility] Pulmonary Rehabilitation.

## 2023-06-14 ENCOUNTER — Encounter: Payer: Medicare Other | Admitting: *Deleted

## 2023-06-14 DIAGNOSIS — Z955 Presence of coronary angioplasty implant and graft: Secondary | ICD-10-CM | POA: Diagnosis not present

## 2023-06-14 NOTE — Progress Notes (Signed)
Daily Session Note  Patient Details  Name: Frank Wong MRN: [id number] Date of Birth: 09/02/1955 Referring Provider:   Flowsheet Row Cardiac Rehab from 05/31/2023 in [facility] Cardiac and Pulmonary Rehab  Referring Provider [name], MD       Encounter Date: 06/14/2023  Check In:  Session Check In - 06/14/23 1437       Check-In   Supervising physician immediately available to respond to emergencies See telemetry face sheet for immediately available ER MD    Location [facility]-Cardiac & Pulmonary Rehab    Staff Present [name] BS, Exercise Physiologist;[name], BS, Exercise Physiologist;Joseph Hood RCP,RRT,BSRT;Cora Collum, RN, BSN, CCRP    Virtual Visit No    Medication changes reported     No    Fall or balance concerns reported    No    Warm-up and Cool-down Performed on first and last piece of equipment    Resistance Training Performed Yes    VAD Patient? No    PAD/SET Patient? No      Pain Assessment   Currently in Pain? No/denies                Social History   Tobacco Use  Smoking Status Former   Current packs/day: 0.00   Types: Cigarettes   Quit date: 05/29/1993   Years since quitting: 30.0  Smokeless Tobacco Former   Types: Snuff, Chew   Quit date: 05/29/1978    Goals Met:  Independence with exercise equipment Exercise tolerated well No report of concerns or symptoms today  Goals Unmet:  Not Applicable  Comments: Pt able to follow exercise prescription today without complaint.  Will continue to monitor for progression.    Dr. Bethann Punches is Medical Director for [facility] Cardiac Rehabilitation.  Dr. Vida Rigger is Medical Director for [facility] Pulmonary Rehabilitation.

## 2023-06-18 ENCOUNTER — Encounter: Payer: Medicare Other | Admitting: *Deleted

## 2023-06-18 DIAGNOSIS — Z955 Presence of coronary angioplasty implant and graft: Secondary | ICD-10-CM | POA: Diagnosis not present

## 2023-06-18 NOTE — Progress Notes (Signed)
Daily Session Note  Patient Details  Name: Frank Wong MRN: [id number] Date of Birth: 28-Oct-1955 Referring Provider:   Flowsheet Row Cardiac Rehab from 05/31/2023 in [facility] Cardiac and Pulmonary Rehab  Referring Provider [name], MD       Encounter Date: 06/18/2023  Check In:  Session Check In - 06/18/23 1355       Check-In   Supervising physician immediately available to respond to emergencies See telemetry face sheet for immediately available ER MD    Location [facility]-Cardiac & Pulmonary Rehab    Staff Present [name] BS, Exercise Physiologist;[name], BS, Exercise Physiologist;[name] [name] RN,BSN;Joseph Hood RCP,RRT,BSRT    Virtual Visit No    Medication changes reported     No    Fall or balance concerns reported    No    Warm-up and Cool-down Performed on first and last piece of equipment    Resistance Training Performed Yes    VAD Patient? No    PAD/SET Patient? No      Pain Assessment   Currently in Pain? No/denies                Social History   Tobacco Use  Smoking Status Former   Current packs/day: 0.00   Types: Cigarettes   Quit date: 05/29/1993   Years since quitting: 30.0  Smokeless Tobacco Former   Types: Snuff, Chew   Quit date: 05/29/1978    Goals Met:  Independence with exercise equipment Exercise tolerated well No report of concerns or symptoms today Strength training completed today  Goals Unmet:  Not Applicable  Comments: Pt able to follow exercise prescription today without complaint.  Will continue to monitor for progression.    Dr. Bethann Punches is Medical Director for [facility] Cardiac Rehabilitation.  Dr. Vida Rigger is Medical Director for [facility] Pulmonary Rehabilitation.

## 2023-06-20 ENCOUNTER — Encounter: Payer: Medicare Other | Admitting: *Deleted

## 2023-06-20 DIAGNOSIS — Z955 Presence of coronary angioplasty implant and graft: Secondary | ICD-10-CM | POA: Diagnosis not present

## 2023-06-20 NOTE — Progress Notes (Signed)
Daily Session Note  Patient Details  Name: Frank Wong MRN: [id number] Date of Birth: 1956-04-04 Referring Provider:   Flowsheet Row Cardiac Rehab from 05/31/2023 in [facility] Cardiac and Pulmonary Rehab  Referring Provider [name], MD       Encounter Date: 06/20/2023  Check In:  Session Check In - 06/20/23 1351       Check-In   Supervising physician immediately available to respond to emergencies See telemetry face sheet for immediately available ER MD    Location [facility]-Cardiac & Pulmonary Rehab    Staff Present Tommye Standard BS, ACSM CEP, Exercise Physiologist;Geniya Fulgham [name] RN,BSN;[name], BS, Exercise Physiologist    Virtual Visit No    Medication changes reported     No    Fall or balance concerns reported    No    Warm-up and Cool-down Performed on first and last piece of equipment    Resistance Training Performed Yes    VAD Patient? No    PAD/SET Patient? No      Pain Assessment   Currently in Pain? No/denies                Social History   Tobacco Use  Smoking Status Former   Current packs/day: 0.00   Types: Cigarettes   Quit date: 05/29/1993   Years since quitting: 30.0  Smokeless Tobacco Former   Types: Snuff, Chew   Quit date: 05/29/1978    Goals Met:  Independence with exercise equipment Exercise tolerated well No report of concerns or symptoms today Strength training completed today  Goals Unmet:  Not Applicable  Comments: Pt able to follow exercise prescription today without complaint.  Will continue to monitor for progression.    Dr. Bethann Punches is Medical Director for [facility] Cardiac Rehabilitation.  Dr. Vida Rigger is Medical Director for [facility] Pulmonary Rehabilitation.

## 2023-06-21 ENCOUNTER — Encounter: Payer: Medicare Other | Admitting: *Deleted

## 2023-06-21 DIAGNOSIS — Z955 Presence of coronary angioplasty implant and graft: Secondary | ICD-10-CM | POA: Diagnosis not present

## 2023-06-21 NOTE — Progress Notes (Signed)
Daily Session Note  Patient Details  Name: ISAYAH DUNCANSON MRN: [id number] Date of Birth: [birth year] Referring Provider:   Flowsheet Row Cardiac Rehab from 05/31/2023 in [facility] Cardiac and Pulmonary Rehab  Referring Provider [name], MD       Encounter Date: 06/21/2023  Check In:  Session Check In - 06/21/23 1352       Check-In   Supervising physician immediately available to respond to emergencies See telemetry face sheet for immediately available ER MD    Location [facility]-Cardiac & Pulmonary Rehab    Staff Present [name] BS, Exercise Physiologist;[name], BS, Exercise Physiologist;Susanne Bice, RN, BSN, CCRP;Vernon Ariel RN,BSN    Virtual Visit No    Medication changes reported     No    Fall or balance concerns reported    No    Warm-up and Cool-down Performed on first and last piece of equipment    Resistance Training Performed Yes    VAD Patient? No    PAD/SET Patient? No      Pain Assessment   Currently in Pain? No/denies                Social History   Tobacco Use  Smoking Status Former   Current packs/day: 0.00   Types: Cigarettes   Quit date: 05/29/1993   Years since quitting: 30.0  Smokeless Tobacco Former   Types: Snuff, Chew   Quit date: 05/29/1978    Goals Met:  Independence with exercise equipment Exercise tolerated well No report of concerns or symptoms today Strength training completed today  Goals Unmet:  Not Applicable  Comments: Pt able to follow exercise prescription today without complaint.  Will continue to monitor for progression.    Dr. Bethann Punches is Medical Director for [facility] Cardiac Rehabilitation.  Dr. Vida Rigger is Medical Director for [facility] Pulmonary Rehabilitation.

## 2023-06-22 ENCOUNTER — Other Ambulatory Visit: Payer: Self-pay | Admitting: *Deleted

## 2023-06-22 DIAGNOSIS — E291 Testicular hypofunction: Secondary | ICD-10-CM

## 2023-06-25 ENCOUNTER — Encounter: Payer: Medicare Other | Admitting: *Deleted

## 2023-06-25 DIAGNOSIS — Z955 Presence of coronary angioplasty implant and graft: Secondary | ICD-10-CM | POA: Diagnosis not present

## 2023-06-25 NOTE — Progress Notes (Signed)
Daily Session Note  Patient Details  Name: Frank Wong MRN: [id number] Date of Birth: [birth year] Referring Provider:   Flowsheet Row Cardiac Rehab from 05/31/2023 in [facility] Cardiac and Pulmonary Rehab  Referring Provider [name], MD       Encounter Date: 06/25/2023  Check In:  Session Check In - 06/25/23 1359       Check-In   Supervising physician immediately available to respond to emergencies See telemetry face sheet for immediately available ER MD    Location [facility]-Cardiac & Pulmonary Rehab    Staff Present Susann Givens RN,BSN;[name] BS, Exercise Physiologist;[name], BS, Exercise Physiologist;Joseph Reino Kent RCP,RRT,BSRT    Virtual Visit No    Medication changes reported     No    Fall or balance concerns reported    No    Warm-up and Cool-down Performed on first and last piece of equipment    Resistance Training Performed Yes    VAD Patient? No    PAD/SET Patient? No      Pain Assessment   Currently in Pain? No/denies                Social History   Tobacco Use  Smoking Status Former   Current packs/day: 0.00   Types: Cigarettes   Quit date: 05/29/1993   Years since quitting: 30.0  Smokeless Tobacco Former   Types: Snuff, Chew   Quit date: 05/29/1978    Goals Met:  Independence with exercise equipment Exercise tolerated well No report of concerns or symptoms today Strength training completed today  Goals Unmet:  Not Applicable  Comments: Pt able to follow exercise prescription today without complaint.  Will continue to monitor for progression.    Dr. Bethann Punches is Medical Director for [facility] Cardiac Rehabilitation.  Dr. Vida Rigger is Medical Director for [facility] Pulmonary Rehabilitation.

## 2023-06-27 ENCOUNTER — Other Ambulatory Visit: Payer: Medicare Other

## 2023-06-27 DIAGNOSIS — E291 Testicular hypofunction: Secondary | ICD-10-CM

## 2023-06-28 ENCOUNTER — Encounter: Payer: Medicare Other | Admitting: *Deleted

## 2023-06-28 ENCOUNTER — Other Ambulatory Visit: Payer: Medicare Other

## 2023-06-28 DIAGNOSIS — Z955 Presence of coronary angioplasty implant and graft: Secondary | ICD-10-CM | POA: Diagnosis not present

## 2023-06-28 DIAGNOSIS — Z951 Presence of aortocoronary bypass graft: Secondary | ICD-10-CM

## 2023-06-28 LAB — HEMOGLOBIN AND HEMATOCRIT, BLOOD
Hematocrit: 48.4 % (ref 37.5–51.0)
Hemoglobin: 17 g/dL (ref 13.0–17.7)

## 2023-06-28 NOTE — Progress Notes (Signed)
Daily Session Note  Patient Details  Name: Frank Wong MRN: [id number] Date of Birth: 1955/11/29 Referring Provider:   Flowsheet Row Cardiac Rehab from 05/31/2023 in [facility] Cardiac and Pulmonary Rehab  Referring Provider [name], MD       Encounter Date: 06/28/2023  Check In:  Session Check In - 06/28/23 1344       Check-In   Supervising physician immediately available to respond to emergencies See telemetry face sheet for immediately available ER MD    Location [facility]-Cardiac & Pulmonary Rehab    Staff Present [name] BS, Exercise Physiologist;Joseph Hood RCP,RRT,BSRT;Marrio Scribner RN,BSN;[name], [place], Exercise Physiologist    Virtual Visit No    Medication changes reported     No    Fall or balance concerns reported    No    Warm-up and Cool-down Performed on first and last piece of equipment    Resistance Training Performed Yes    VAD Patient? No    PAD/SET Patient? No      Pain Assessment   Currently in Pain? No/denies                Social History   Tobacco Use  Smoking Status Former   Current packs/day: 0.00   Types: Cigarettes   Quit date: 05/29/1993   Years since quitting: 30.1  Smokeless Tobacco Former   Types: Snuff, Chew   Quit date: 05/29/1978    Goals Met:  Independence with exercise equipment Exercise tolerated well No report of concerns or symptoms today Strength training completed today  Goals Unmet:  Not Applicable  Comments: Pt able to follow exercise prescription today without complaint.  Will continue to monitor for progression.    Dr. Bethann Punches is Medical Director for [facility] Cardiac Rehabilitation.  Dr. Vida Rigger is Medical Director for [facility] Pulmonary Rehabilitation.

## 2023-07-02 ENCOUNTER — Encounter: Payer: 59 | Attending: Cardiology | Admitting: *Deleted

## 2023-07-02 DIAGNOSIS — Z48812 Encounter for surgical aftercare following surgery on the circulatory system: Secondary | ICD-10-CM | POA: Diagnosis present

## 2023-07-02 DIAGNOSIS — Z951 Presence of aortocoronary bypass graft: Secondary | ICD-10-CM | POA: Diagnosis present

## 2023-07-02 DIAGNOSIS — Z955 Presence of coronary angioplasty implant and graft: Secondary | ICD-10-CM | POA: Diagnosis present

## 2023-07-02 NOTE — Progress Notes (Signed)
Daily Session Note  Patient Details  Name: Frank Wong MRN: [id number] Date of Birth: 01-11-1956 Referring Provider:   Flowsheet Row Cardiac Rehab from 05/31/2023 in [facility] Cardiac and Pulmonary Rehab  Referring Provider [name], MD       Encounter Date: 07/02/2023  Check In:  Session Check In - 07/02/23 1351       Check-In   Supervising physician immediately available to respond to emergencies See telemetry face sheet for immediately available ER MD    Location [facility]-Cardiac & Pulmonary Rehab    Staff Present [name] BS, Exercise Physiologist;Sonny Poth [name] RN,BSN;Joseph Hood RCP,RRT,BSRT    Virtual Visit No    Medication changes reported     No    Fall or balance concerns reported    No    Warm-up and Cool-down Performed on first and last piece of equipment    Resistance Training Performed Yes    VAD Patient? No    PAD/SET Patient? No      Pain Assessment   Currently in Pain? No/denies                Social History   Tobacco Use  Smoking Status Former   Current packs/day: 0.00   Types: Cigarettes   Quit date: 05/29/1993   Years since quitting: 30.1  Smokeless Tobacco Former   Types: Snuff, Chew   Quit date: 05/29/1978    Goals Met:  Independence with exercise equipment Exercise tolerated well No report of concerns or symptoms today Strength training completed today  Goals Unmet:  Not Applicable  Comments: Pt able to follow exercise prescription today without complaint.  Will continue to monitor for progression.    Dr. Bethann Punches is Medical Director for [facility] Cardiac Rehabilitation.  Dr. Vida Rigger is Medical Director for [facility] Pulmonary Rehabilitation.

## 2023-07-04 ENCOUNTER — Encounter: Payer: 59 | Admitting: *Deleted

## 2023-07-04 DIAGNOSIS — Z48812 Encounter for surgical aftercare following surgery on the circulatory system: Secondary | ICD-10-CM | POA: Diagnosis not present

## 2023-07-04 DIAGNOSIS — Z955 Presence of coronary angioplasty implant and graft: Secondary | ICD-10-CM

## 2023-07-04 NOTE — Progress Notes (Signed)
 Daily Session Note  Patient Details  Name: Frank Wong MRN: [id number] Date of Birth: 11/27/1955 Referring Provider:   Flowsheet Row Cardiac Rehab from 05/31/2023 in [facility] Cardiac and Pulmonary Rehab  Referring Provider Ammon Blunt, MD       Encounter Date: 07/04/2023  Check In:  Session Check In - 07/04/23 1355       Check-In   Supervising physician immediately available to respond to emergencies See telemetry face sheet for immediately available ER MD    Location [facility]-Cardiac & Pulmonary Rehab    Staff Present Hoy Rodney RN,BSN;Maxon Burnell BS, Exercise Physiologist;Kelly Dyane HECKLE, ACSM CEP, Exercise Physiologist;[name], BS, Exercise Physiologist    Virtual Visit No    Medication changes reported     No    Fall or balance concerns reported    No    Warm-up and Cool-down Performed on first and last piece of equipment    Resistance Training Performed Yes    VAD Patient? No    PAD/SET Patient? No      Pain Assessment   Currently in Pain? No/denies                Social History   Tobacco Use  Smoking Status Former   Current packs/day: 0.00   Types: Cigarettes   Quit date: 05/29/1993   Years since quitting: 30.1  Smokeless Tobacco Former   Types: Snuff, Chew   Quit date: 05/29/1978    Goals Met:  Independence with exercise equipment Exercise tolerated well No report of concerns or symptoms today Strength training completed today  Goals Unmet:  Not Applicable  Comments: Pt able to follow exercise prescription today without complaint.  Will continue to monitor for progression.    Dr. Oneil Pinal is Medical Director for [facility] Cardiac Rehabilitation.  Dr. Fuad Aleskerov is Medical Director for [facility] Pulmonary Rehabilitation.

## 2023-07-11 ENCOUNTER — Encounter: Payer: 59 | Admitting: *Deleted

## 2023-07-11 DIAGNOSIS — Z951 Presence of aortocoronary bypass graft: Secondary | ICD-10-CM

## 2023-07-11 DIAGNOSIS — Z955 Presence of coronary angioplasty implant and graft: Secondary | ICD-10-CM

## 2023-07-11 DIAGNOSIS — Z48812 Encounter for surgical aftercare following surgery on the circulatory system: Secondary | ICD-10-CM | POA: Diagnosis not present

## 2023-07-11 NOTE — Progress Notes (Signed)
Daily Session Note  Patient Details  Name: Frank Wong MRN: [id number] Date of Birth: February 20, 1956 Referring Provider:   Flowsheet Row Cardiac Rehab from 05/31/2023 in [facility]  Referring Provider [name], MD       Encounter Date: 07/11/2023  Check In:  Session Check In - 07/11/23 1352       Check-In   Supervising physician immediately available to respond to emergencies See telemetry face sheet for immediately available ER MD    Location [facility]-Cardiac & Pulmonary Rehab    Staff Present Susann Givens RN,BSN;Maxon Manya Silvas BS, Exercise Physiologist;[name], BS, Exercise Physiologist;Kelly Cloretta Ned, ACSM CEP, Exercise Physiologist    Virtual Visit No    Medication changes reported     No    Fall or balance concerns reported    No    Warm-up and Cool-down Performed on first and last piece of equipment    Resistance Training Performed Yes    VAD Patient? No    PAD/SET Patient? No      Pain Assessment   Currently in Pain? No/denies                Social History   Tobacco Use  Smoking Status Former   Current packs/day: 0.00   Types: Cigarettes   Quit date: 05/29/1993   Years since quitting: 30.1  Smokeless Tobacco Former   Types: Snuff, Chew   Quit date: 05/29/1978    Goals Met:  Independence with exercise equipment Exercise tolerated well No report of concerns or symptoms today Strength training completed today  Goals Unmet:  Not Applicable  Comments: Pt able to follow exercise prescription today without complaint.  Will continue to monitor for progression.    Dr. Bethann Punches is Medical Director for [facility] Cardiac Rehabilitation.  Dr. Vida Rigger is Medical Director for [facility] Pulmonary Rehabilitation.

## 2023-07-11 NOTE — Progress Notes (Signed)
Cardiac Individual Treatment Plan  Patient Details  Name: Frank Wong MRN: [id number] Date of Birth: 06/16/1955 Referring Provider:   Flowsheet Row Cardiac Rehab from 05/31/2023 in [facility] Cardiac and Pulmonary Rehab  Referring Provider [name], MD       Initial Encounter Date:  Flowsheet Row Cardiac Rehab from 05/31/2023 in [facility] Cardiac and Pulmonary Rehab  Date 05/31/23       Visit Diagnosis: Status post coronary artery stent placement  S/P CABG x 2  Patient's Home Medications on Admission:  Current Outpatient Medications:    acidophilus (RISAQUAD) CAPS capsule, Take 1 capsule by mouth daily., Disp: , Rfl:    amphetamine-dextroamphetamine (ADDERALL XR) 10 MG 24 hr capsule, Take 10 mg by mouth daily., Disp: , Rfl:    aspirin EC 81 MG tablet, Take 81 mg by mouth in the morning., Disp: , Rfl:    atorvastatin (LIPITOR) 80 MG tablet, Take 80 mg by mouth in the morning., Disp: , Rfl:    B Complex-C (B-COMPLEX WITH VITAMIN C) tablet, Take 1 tablet by mouth in the morning. (Patient not taking: Reported on 05/24/2023), Disp: , Rfl:    Cholecalciferol (VITAMIN D-1000 MAX ST) 25 MCG (1000 UT) tablet, Take 1,000 Units by mouth daily. (Patient not taking: Reported on 05/24/2023), Disp: , Rfl:    clonazePAM (KLONOPIN) 2 MG tablet, Take 1 mg by mouth at bedtime as needed (sleep/ anxiety)., Disp: , Rfl:    fexofenadine (ALLEGRA) 180 MG tablet, Take 180 mg by mouth in the morning., Disp: , Rfl:    Fluticasone-Umeclidin-Vilant 100-62.5-25 MCG/ACT AEPB, Inhale 1 puff into the lungs daily., Disp: , Rfl:    gabapentin (NEURONTIN) 600 MG tablet, Take 600 mg by mouth 2 (two) times daily., Disp: , Rfl:    glipiZIDE (GLUCOTROL XL) 2.5 MG 24 hr tablet, Take 2.5 mg by mouth daily., Disp: , Rfl:    isosorbide mononitrate (IMDUR) 30 MG 24 hr tablet, Take 30 mg by mouth daily., Disp: , Rfl:    JARDIANCE 25 MG TABS tablet, Take 25 mg by mouth in the morning., Disp: , Rfl:    levalbuterol (XOPENEX  HFA) 45 MCG/ACT inhaler, Inhale 1-2 puffs into the lungs every 6 (six) hours as needed for wheezing or shortness of breath., Disp: , Rfl:    levocetirizine (XYZAL) 5 MG tablet, Take 5 mg by mouth every evening., Disp: , Rfl:    lisinopril (ZESTRIL) 2.5 MG tablet, Take 2.5 mg by mouth daily., Disp: , Rfl:    magnesium oxide (MAG-OX) 400 MG tablet, Take 400 mg by mouth at bedtime. (Patient not taking: Reported on 05/24/2023), Disp: , Rfl:    metFORMIN (GLUCOPHAGE) 1000 MG tablet, Take 1,000 mg by mouth 2 (two) times daily with a meal. , Disp: , Rfl:    montelukast (SINGULAIR) 10 MG tablet, Take 10 mg by mouth at bedtime., Disp: , Rfl:    Multiple Vitamin (MULTIVITAMIN WITH MINERALS) TABS tablet, Take 1 tablet by mouth in the morning., Disp: , Rfl:    nitroGLYCERIN (NITROSTAT) 0.4 MG SL tablet, Place 0.4 mg under the tongue every 5 (five) minutes as needed for chest pain., Disp: , Rfl:    NON FORMULARY, Pt uses a bi-pap nightly, Disp: , Rfl:    pantoprazole (PROTONIX) 40 MG tablet, Take 40 mg by mouth at bedtime., Disp: , Rfl:    prasugrel (EFFIENT) 10 MG TABS tablet, Take 1 tablet (10 mg total) by mouth daily., Disp: 30 tablet, Rfl: 0   sertraline (ZOLOFT) 25  MG tablet, Take 25 mg by mouth daily., Disp: , Rfl:    Testosterone 1.62 % GEL, APPLY TWO (2) PUMPS TOPICALLY IN THE MORNING TO THE SHOULDERS AS DIRECTED (Patient not taking: Reported on 04/25/2023), Disp: 75 g, Rfl: 2   traZODone (DESYREL) 50 MG tablet, Take 50 mg by mouth at bedtime., Disp: , Rfl:   Past Medical History: Past Medical History:  Diagnosis Date   Alcoholism (HCC)    Asthma    CAD (coronary artery disease)    Cardiac arrest (HCC)    Chronic cardiac arrhythmia    Chronic kidney disease    COPD (chronic obstructive pulmonary disease) (HCC)    Depression    Diabetes mellitus without complication (HCC)    Essential tremor    GERD (gastroesophageal reflux disease)    Heart attack (HCC)    Heart murmur    Hyperlipidemia     Hypertension    IBS (irritable bowel syndrome)    Shoulder problem 2010   right   Sinoatrial node dysfunction (HCC)    Sleep apnea     Tobacco Use: Social History   Tobacco Use  Smoking Status Former   Current packs/day: 0.00   Types: Cigarettes   Quit date: 05/29/1993   Years since quitting: 30.1  Smokeless Tobacco Former   Types: Snuff, Chew   Quit date: 05/29/1978    Labs: Review Flowsheet        No data to display           Exercise Target Goals: Exercise Program Goal: Individual exercise prescription set using results from initial 6 min walk test and THRR while considering  patient's activity barriers and safety.   Exercise Prescription Goal: Initial exercise prescription builds to 30-45 minutes a day of aerobic activity, 2-3 days per week.  Home exercise guidelines will be given to patient during program as part of exercise prescription that the participant will acknowledge.   Education: Aerobic Exercise: - Group verbal and visual presentation on the components of exercise prescription. Introduces F.I.T.T principle from ACSM for exercise prescriptions.  Reviews F.I.T.T. principles of aerobic exercise including progression. Written material given at graduation. Flowsheet Row Cardiac Rehab from 10/19/2021 in [facility] Cardiac and Pulmonary Rehab  Date 09/28/21  Educator [facility]  Instruction Review Code 1- Verbalizes Understanding       Education: Resistance Exercise: - Group verbal and visual presentation on the components of exercise prescription. Introduces F.I.T.T principle from ACSM for exercise prescriptions  Reviews F.I.T.T. principles of resistance exercise including progression. Written material given at graduation. Flowsheet Row Cardiac Rehab from 10/19/2021 in [facility] Cardiac and Pulmonary Rehab  Date 10/05/21  Educator [facility]  Instruction Review Code 1- Verbalizes Understanding        Education: Exercise & Equipment Safety: - Individual verbal instruction and  demonstration of equipment use and safety with use of the equipment. Flowsheet Row Cardiac Rehab from 07/04/2023 in [facility] Cardiac and Pulmonary Rehab  Date 05/31/23  Educator [facility]  Instruction Review Code 1- Verbalizes Understanding       Education: Exercise Physiology & General Exercise Guidelines: - Group verbal and written instruction with models to review the exercise physiology of the cardiovascular system and associated critical values. Provides general exercise guidelines with specific guidelines to those with heart or lung disease.  Flowsheet Row Cardiac Rehab from 10/19/2021 in [facility] Cardiac and Pulmonary Rehab  Date 09/21/21  Educator [facility]  Instruction Review Code 1- [employer] Understanding       Education: Flexibility, Balance, Mind/Body  Relaxation: - Group verbal and visual presentation with interactive activity on the components of exercise prescription. Introduces F.I.T.T principle from ACSM for exercise prescriptions. Reviews F.I.T.T. principles of flexibility and balance exercise training including progression. Also discusses the mind body connection.  Reviews various relaxation techniques to help reduce and manage stress (i.e. Deep breathing, progressive muscle relaxation, and visualization). Balance handout provided to take home. Written material given at graduation. Flowsheet Row Cardiac Rehab from 12/29/2020 in [facility] Cardiac and Pulmonary Rehab  Date 12/01/20  Educator [facility]  Instruction Review Code 1- Verbalizes Understanding       Activity Barriers & Risk Stratification:  Activity Barriers & Cardiac Risk Stratification - 05/31/23 1614       Activity Barriers & Cardiac Risk Stratification   Activity Barriers Shortness of Breath;Joint Problems   has COPD; joint issues: bilateral shoulders, bilateral hips, bilateral knees; L shoulder surgery scheduled   Cardiac Risk Stratification Moderate             6 Minute Walk:  6 Minute Walk     Row Name 05/31/23 1613          6 Minute Walk   Phase Initial     Distance 1705 feet     Walk Time 6 minutes     # of Rest Breaks 0     MPH 3.23     METS 3.76     RPE 8     Perceived Dyspnea  1     VO2 Peak 13.14     Symptoms No     Resting HR 80 bpm     Resting BP 100/60     Resting Oxygen Saturation  93 %     Exercise Oxygen Saturation  during 6 min walk 97 %     Max Ex. HR 108 bpm     Max Ex. BP 100/60     2 Minute Post BP 98/60              Oxygen Initial Assessment:   Oxygen Re-Evaluation:   Oxygen Discharge (Final Oxygen Re-Evaluation):   Initial Exercise Prescription:  Initial Exercise Prescription - 05/31/23 1600       Date of Initial Exercise RX and Referring Provider   Date 05/31/23    Referring Provider [name], MD      Oxygen   Maintain Oxygen Saturation 88% or higher      Treadmill   MPH 3.3    Grade 0.5    Minutes 15    METs 3.75      Elliptical   Level 3    Speed 3    Minutes 15    METs 3.76      REL-XR   Level 3    Speed 50    Minutes 15    METs 3.76      Prescription Details   Frequency (times per week) 3    Duration Progress to 30 minutes of continuous aerobic without signs/symptoms of physical distress      Intensity   THRR 40-80% of Max Heartrate 110-140    Ratings of Perceived Exertion 11-13    Perceived Dyspnea 0-4      Progression   Progression Continue to progress workloads to maintain intensity without signs/symptoms of physical distress.      Resistance Training   Training Prescription Yes    Weight 7 lb    Reps 10-15             Perform Capillary  Blood Glucose checks as needed.  Exercise Prescription Changes:   Exercise Prescription Changes     Row Name 05/31/23 1600 06/21/23 1100 07/05/23 0800         Response to Exercise   Blood Pressure (Admit) 100/60 110/60 102/58     Blood Pressure (Exercise) 100/60 160/62 152/62     Blood Pressure (Exit) 98/60 90/56 104/64     Heart Rate (Admit) 80 bpm 73 bpm 58 bpm      Heart Rate (Exercise) 108 bpm 138 bpm 155 bpm     Heart Rate (Exit) 86 bpm 101 bpm 108 bpm     Oxygen Saturation (Admit) 93 % -- --     Oxygen Saturation (Exercise) 97 % -- --     Oxygen Saturation (Exit) 96 % -- --     Rating of Perceived Exertion (Exercise) 8 15 16      Perceived Dyspnea (Exercise) 1 -- --     Symptoms none none none     Comments results First two weeks of exercise --     Duration Progress to 30 minutes of  aerobic without signs/symptoms of physical distress Continue with 30 min of aerobic exercise without signs/symptoms of physical distress. Continue with 30 min of aerobic exercise without signs/symptoms of physical distress.     Intensity THRR New THRR unchanged THRR unchanged       Progression   Progression Continue to progress workloads to maintain intensity without signs/symptoms of physical distress. Continue to progress workloads to maintain intensity without signs/symptoms of physical distress. Continue to progress workloads to maintain intensity without signs/symptoms of physical distress.     Average METs 3.76 7.68 8.37       Resistance Training   Training Prescription -- Yes Yes     Weight -- 7 lb 8 lb     Reps -- 10-15 10-15       Interval Training   Interval Training -- No No       Treadmill   MPH -- 4 4     Grade -- 11 12     Minutes -- 15 15     METs -- 10.13 10.68       Elliptical   Level -- 3 9     Speed -- 6 3.8     Minutes -- 15 15     METs -- -- 4.1       REL-XR   Level -- 5 8     Minutes -- 15 15     METs -- 7.5 7.5       Oxygen   Maintain Oxygen Saturation -- 88% or higher 88% or higher              Exercise Comments:   Exercise Comments     Row Name 06/06/23 1405           Exercise Comments First full day of exercise!  Patient was oriented to gym and equipment including functions, settings, policies, and procedures.  Patient's individual exercise prescription and treatment plan were reviewed.  All starting  workloads were established based on the results of the 6 minute walk test done at initial orientation visit.  The plan for exercise progression was also introduced and progression will be customized based on patient's performance and goals.                Exercise Goals and Review:   Exercise Goals     Row Name 05/31/23 [phone]  Exercise Goals   Increase Physical Activity Yes       Intervention Provide advice, education, support and counseling about physical activity/exercise needs.;Develop an individualized exercise prescription for aerobic and resistive training based on initial evaluation findings, risk stratification, comorbidities and participant's personal goals.       Expected Outcomes Long Term: Add in home exercise to make exercise part of routine and to increase amount of physical activity.;Short Term: Attend rehab on a regular basis to increase amount of physical activity.;Long Term: Exercising regularly at least 3-5 days a week.       Increase Strength and Stamina Yes       Intervention Provide advice, education, support and counseling about physical activity/exercise needs.;Develop an individualized exercise prescription for aerobic and resistive training based on initial evaluation findings, risk stratification, comorbidities and participant's personal goals.       Expected Outcomes Short Term: Increase workloads from initial exercise prescription for resistance, speed, and METs.;Short Term: Perform resistance training exercises routinely during rehab and add in resistance training at home;Long Term: Improve cardiorespiratory fitness, muscular endurance and strength as measured by increased METs and functional capacity ( )       Able to understand and use rate of perceived exertion (RPE) scale Yes       Intervention Provide education and explanation on how to use RPE scale       Expected Outcomes Long Term:  Able to use RPE to guide intensity level when exercising  independently;Short Term: Able to use RPE daily in rehab to express subjective intensity level       Able to understand and use Dyspnea scale Yes       Intervention Provide education and explanation on how to use Dyspnea scale       Expected Outcomes Short Term: Able to use Dyspnea scale daily in rehab to express subjective sense of shortness of breath during exertion;Long Term: Able to use Dyspnea scale to guide intensity level when exercising independently       Knowledge and understanding of Target Heart Rate Range (THRR) Yes       Intervention Provide education and explanation of THRR including how the numbers were predicted and where they are located for reference       Expected Outcomes Short Term: Able to state/look up THRR;Long Term: Able to use THRR to govern intensity when exercising independently;Short Term: Able to use daily as guideline for intensity in rehab       Able to check pulse independently Yes       Intervention Provide education and demonstration on how to check pulse in carotid and radial arteries.;Review the importance of being able to check your own pulse for safety during independent exercise       Expected Outcomes Short Term: Able to explain why pulse checking is important during independent exercise;Long Term: Able to check pulse independently and accurately       Understanding of Exercise Prescription Yes       Intervention Provide education, explanation, and written materials on patient's individual exercise prescription       Expected Outcomes Short Term: Able to explain program exercise prescription;Long Term: Able to explain home exercise prescription to exercise independently                Exercise Goals Re-Evaluation :  Exercise Goals Re-Evaluation     Row Name 06/06/23 1406 06/21/23 1128 07/05/23 0808         Exercise Goal Re-Evaluation   Exercise  Goals Review Able to understand and use rate of perceived exertion (RPE) scale;Increase Physical  Activity;Knowledge and understanding of Target Heart Rate Range (THRR);Understanding of Exercise Prescription;Increase Strength and Stamina;Able to check pulse independently Increase Physical Activity;Understanding of Exercise Prescription;Increase Strength and Stamina Increase Physical Activity;Understanding of Exercise Prescription;Increase Strength and Stamina     Comments Reviewed RPE and dyspnea scale, THR and program prescription with pt today.  Pt voiced understanding and was given a copy of goals to take home. Decari is off to a good start in the program. He has done well on the treadmill and has increased is workload up to a speed of 4 mph with an 11% incline. He also has done well at level 5 on the XR and level 3 on the elliptical. We will continue to monitor his progress in the program. Chuckie continues to do well in rehab. He recently increased his incline on the treadmill to 12% while maintaining a speed of 4 mph. He also improved to level 9 on the elliptical and level 8 on the XR, and increased to 8 lb hand weights for resistance training. We will continue to monitor his progress in the program.     Expected Outcomes Short: Use RPE daily to regulate intensity.  Long: Follow program prescription in THR. Short: Continue to progressively increase workloads. Long: Continue exercise to improve strength and stamina. Short: Continue to progressively increase workloads. Long: Continue exercise to improve strength and stamina.              Discharge Exercise Prescription (Final Exercise Prescription Changes):  Exercise Prescription Changes - 07/05/23 0800       Response to Exercise   Blood Pressure (Admit) 102/58    Blood Pressure (Exercise) 152/62    Blood Pressure (Exit) 104/64    Heart Rate (Admit) 58 bpm    Heart Rate (Exercise) 155 bpm    Heart Rate (Exit) 108 bpm    Rating of Perceived Exertion (Exercise) 16    Symptoms none    Duration Continue with 30 min of aerobic exercise without  signs/symptoms of physical distress.    Intensity THRR unchanged      Progression   Progression Continue to progress workloads to maintain intensity without signs/symptoms of physical distress.    Average METs 8.37      Resistance Training   Training Prescription Yes    Weight 8 lb    Reps 10-15      Interval Training   Interval Training No      Treadmill   MPH 4    Grade 12    Minutes 15    METs 10.68      Elliptical   Level 9    Speed 3.8    Minutes 15    METs 4.1      REL-XR   Level 8    Minutes 15    METs 7.5      Oxygen   Maintain Oxygen Saturation 88% or higher             Nutrition:  Target Goals: Understanding of nutrition guidelines, daily intake of sodium 1500mg , cholesterol 200mg , calories 30% from fat and 7% or less from saturated fats, daily to have 5 or more servings of fruits and vegetables.  Education: All About Nutrition: -Group instruction provided by verbal, written material, interactive activities, discussions, models, and posters to present general guidelines for heart healthy nutrition including fat, fiber, MyPlate, the role of sodium in heart healthy nutrition,  utilization of the nutrition label, and utilization of this knowledge for meal planning. Follow up email sent as well. Written material given at graduation. Flowsheet Row Cardiac Rehab from 07/04/2023 in [facility] Cardiac and Pulmonary Rehab  Education need identified 05/31/23  Date 06/13/23  [part 2 06/20/23]  Educator Ottis Stain  Instruction Review Code 1- Verbalizes Understanding       Biometrics:  Pre Biometrics - 05/31/23 1623       Pre Biometrics   Height 5' 7.5" (1.715 m)    Weight 173 lb 9.6 oz (78.7 kg)    Waist Circumference 39 inches    Hip Circumference 36.5 inches    Waist to Hip Ratio 1.07 %    BMI (Calculated) 26.77    Single Leg Stand 19.3 seconds              Nutrition Therapy Plan and Nutrition Goals:  Nutrition Therapy & Goals - 05/31/23 1625        Nutrition Therapy   RD appointment deferred Yes      Personal Nutrition Goals   Nutrition Goal RD appointment deferred      Intervention Plan   Intervention Prescribe, educate and counsel regarding individualized specific dietary modifications aiming towards targeted core components such as weight, hypertension, lipid management, diabetes, heart failure and other comorbidities.    Expected Outcomes Short Term Goal: Understand basic principles of dietary content, such as calories, fat, sodium, cholesterol and nutrients.             Nutrition Assessments:  MEDIFICTS Score Key: >=70 Need to make dietary changes  40-70 Heart Healthy Diet <= 40 Therapeutic Level Cholesterol Diet  Flowsheet Row Cardiac Rehab from 05/31/2023 in [facility] Cardiac and Pulmonary Rehab  Picture Your Plate Total Score on Admission 67      Picture Your Plate Scores: <98 Unhealthy dietary pattern with much room for improvement. 41-50 Dietary pattern unlikely to meet recommendations for good health and room for improvement. 51-60 More healthful dietary pattern, with some room for improvement.  >60 Healthy dietary pattern, although there may be some specific behaviors that could be improved.    Nutrition Goals Re-Evaluation:  Nutrition Goals Re-Evaluation     Row Name 06/25/23 1440             Goals   Nutrition Goal RD appointment deferred                Nutrition Goals Discharge (Final Nutrition Goals Re-Evaluation):  Nutrition Goals Re-Evaluation - 06/25/23 1440       Goals   Nutrition Goal RD appointment deferred             Psychosocial: Target Goals: Acknowledge presence or absence of significant depression and/or stress, maximize coping skills, provide positive support system. Participant is able to verbalize types and ability to use techniques and skills needed for reducing stress and depression.   Education: Stress, Anxiety, and Depression - Group verbal and visual presentation  to define topics covered.  Reviews how body is impacted by stress, anxiety, and depression.  Also discusses healthy ways to reduce stress and to treat/manage anxiety and depression.  Written material given at graduation. Flowsheet Row Cardiac Rehab from 10/19/2021 in [facility] Cardiac and Pulmonary Rehab  Date 09/14/21  Educator [facility]  Instruction Review Code 1- [employer] Understanding       Education: Sleep Hygiene -Provides group verbal and written instruction about how sleep can affect your health.  Define sleep hygiene, discuss sleep cycles and impact of  sleep habits. Review good sleep hygiene tips.    Initial Review & Psychosocial Screening:  Initial Psych Review & Screening - 05/24/23 1444       Initial Review   Current issues with Current Depression;Current Psychotropic Meds      Family Dynamics   Good Support System? Yes   wife and children     Barriers   Psychosocial barriers to participate in program There are no identifiable barriers or psychosocial needs.      Screening Interventions   Interventions Encouraged to exercise;To provide support and resources with identified psychosocial needs;Provide feedback about the scores to participant    Expected Outcomes Short Term goal: Utilizing psychosocial counselor, staff and physician to assist with identification of specific Stressors or current issues interfering with healing process. Setting desired goal for each stressor or current issue identified.;Long Term Goal: Stressors or current issues are controlled or eliminated.;Short Term goal: Identification and review with participant of any Quality of Life or Depression concerns found by scoring the questionnaire.;Long Term goal: The participant improves quality of Life and PHQ9 Scores as seen by post scores and/or verbalization of changes             Quality of Life Scores:   Quality of Life - 05/31/23 1624       Quality of Life   Select Quality of Life      Quality of Life  Scores   Health/Function Pre 25.8 %    Socioeconomic Pre 27.38 %    Psych/Spiritual Pre 28.43 %    Family Pre 27.6 %    GLOBAL Pre 26.94 %            Scores of 19 and below usually indicate a poorer quality of life in these areas.  A difference of  2-3 points is a clinically meaningful difference.  A difference of 2-3 points in the total score of the Quality of Life Index has been associated with significant improvement in overall quality of life, self-image, physical symptoms, and general health in studies assessing change in quality of life.  PHQ-9: Review Flowsheet  More data may exist      05/31/2023 10/21/2021 08/08/2021 01/05/2021 11/01/2020  Depression screen PHQ 2/9  Decreased Interest 1 1 0 0 0  Down, Depressed, Hopeless 0 1 0 1 0  PHQ - 2 Score 1 2 0 1 0  Altered sleeping 2 1 0 1 1  Tired, decreased energy 1 1 1 1 1   Change in appetite 1 1 1  0 0  Feeling bad or failure about yourself  0 0 0 0 0  Trouble concentrating 2 2 2 1 1   Moving slowly or fidgety/restless 1 2 0 0 0  Suicidal thoughts 0 0 0 0 0  PHQ-9 Score 8 9 4 4 3   Difficult doing work/chores Not difficult at all Not difficult at all Somewhat difficult Not difficult at all Somewhat difficult   Interpretation of Total Score  Total Score Depression Severity:  1-4 = Minimal depression, 5-9 = Mild depression, 10-14 = Moderate depression, 15-19 = Moderately severe depression, 20-27 = Severe depression   Psychosocial Evaluation and Intervention:  Psychosocial Evaluation - 05/24/23 1454       Psychosocial Evaluation & Interventions   Comments Leigh is returning to the program after cardiac stent placement. He is ready to complete the program without any barriers. HIs support system is his wife and children.  He is on Zoloft for depression and the meds help control his  symptoms.  He is ready to get started.    Expected Outcomes Short: attend cardiac rehab for education and exercise. Long: develop and maintain positive  self care habits.    Continue Psychosocial Services  Follow up required by staff             Psychosocial Re-Evaluation:  Psychosocial Re-Evaluation     Row Name 06/25/23 1440             Psychosocial Re-Evaluation   Current issues with History of Depression       Comments Jeanpaul reports no major stressors at this tie. He reports that he continues to take his anti-depressant medication and reports that it has worked well to manage his depression symptoms. He does report having trouble falling asleep but he has medications to help with that. His wife, kids, and dog are a good support sytem for him.       Expected Outcomes Short: Continue to exercise for mental boost. Long: Continue to maintain positive outlook.       Interventions Encouraged to attend Cardiac Rehabilitation for the exercise       Continue Psychosocial Services  Follow up required by staff                Psychosocial Discharge (Final Psychosocial Re-Evaluation):  Psychosocial Re-Evaluation - 06/25/23 1440       Psychosocial Re-Evaluation   Current issues with History of Depression    Comments Ramiel reports no major stressors at this tie. He reports that he continues to take his anti-depressant medication and reports that it has worked well to manage his depression symptoms. He does report having trouble falling asleep but he has medications to help with that. His wife, kids, and dog are a good support sytem for him.    Expected Outcomes Short: Continue to exercise for mental boost. Long: Continue to maintain positive outlook.    Interventions Encouraged to attend Cardiac Rehabilitation for the exercise    Continue Psychosocial Services  Follow up required by staff             Vocational Rehabilitation: Provide vocational rehab assistance to qualifying candidates.   Vocational Rehab Evaluation & Intervention:  Vocational Rehab - 05/24/23 1456       Initial Vocational Rehab Evaluation & Intervention    Assessment shows need for Vocational Rehabilitation No      Vocational Rehab Re-Evaulation   Comments no request for VR             Education: Education Goals: Education classes will be provided on a variety of topics geared toward better understanding of heart health and risk factor modification. Participant will state understanding/return demonstration of topics presented as noted by education test scores.  Learning Barriers/Preferences:   General Cardiac Education Topics:  AED/CPR: - Group verbal and written instruction with the use of models to demonstrate the basic use of the AED with the basic ABC's of resuscitation.   Anatomy and Cardiac Procedures: - Group verbal and visual presentation and models provide information about basic cardiac anatomy and function. Reviews the testing methods done to diagnose heart disease and the outcomes of the test results. Describes the treatment choices: Medical Management, Angioplasty, or Coronary Bypass Surgery for treating various heart conditions including Myocardial Infarction, Angina, Valve Disease, and Cardiac Arrhythmias.  Written material given at graduation. Flowsheet Row Cardiac Rehab from 10/19/2021 in [facility] Cardiac and Pulmonary Rehab  Education need identified 08/08/21  Date 10/05/21  Educator SB  Instruction  Review Code 1- Verbalizes Understanding       Medication Safety: - Group verbal and visual instruction to review commonly prescribed medications for heart and lung disease. Reviews the medication, class of the drug, and side effects. Includes the steps to properly store meds and maintain the prescription regimen.  Written material given at graduation. Flowsheet Row Cardiac Rehab from 07/04/2023 in [facility] Cardiac and Pulmonary Rehab  Date 07/04/23  Educator SB  Instruction Review Code 1- Verbalizes Understanding       Intimacy: - Group verbal instruction through game format to discuss how heart and lung disease can  affect sexual intimacy. Written material given at graduation.. Flowsheet Row Cardiac Rehab from 10/19/2021 in [facility] Cardiac and Pulmonary Rehab  Date 09/28/21  Educator [facility]  Instruction Review Code 1- Verbalizes Understanding       Know Your Numbers and Heart Failure: - Group verbal and visual instruction to discuss disease risk factors for cardiac and pulmonary disease and treatment options.  Reviews associated critical values for Overweight/Obesity, Hypertension, Cholesterol, and Diabetes.  Discusses basics of heart failure: signs/symptoms and treatments.  Introduces Heart Failure Zone chart for action plan for heart failure.  Written material given at graduation. Flowsheet Row Cardiac Rehab from 12/29/2020 in [facility] Cardiac and Pulmonary Rehab  Date 12/22/20  Educator SB  Instruction Review Code 1- Verbalizes Understanding       Infection Prevention: - Provides verbal and written material to individual with discussion of infection control including proper hand washing and proper equipment cleaning during exercise session. Flowsheet Row Cardiac Rehab from 07/04/2023 in [facility] Cardiac and Pulmonary Rehab  Date 05/31/23  Educator MB  Instruction Review Code 1- Verbalizes Understanding       Falls Prevention: - Provides verbal and written material to individual with discussion of falls prevention and safety. Flowsheet Row Cardiac Rehab from 07/04/2023 in [facility] Cardiac and Pulmonary Rehab  Date 05/31/23  Educator MB  Instruction Review Code 1- Verbalizes Understanding       Other: -Provides group and verbal instruction on various topics (see comments)   Knowledge Questionnaire Score:  Knowledge Questionnaire Score - 05/31/23 1628       Knowledge Questionnaire Score   Pre Score 25/26             Core Components/Risk Factors/Patient Goals at Admission:  Personal Goals and Risk Factors at Admission - 05/31/23 1628       Core Components/Risk Factors/Patient Goals on Admission     Weight Management Yes;Weight Loss    Intervention Weight Management: Develop a combined nutrition and exercise program designed to reach desired caloric intake, while maintaining appropriate intake of nutrient and fiber, sodium and fats, and appropriate energy expenditure required for the weight goal.;Weight Management: Provide education and appropriate resources to help participant work on and attain dietary goals.;Weight Management/Obesity: Establish reasonable short term and long term weight goals.    Admit Weight 173 lb 9.6 oz (78.7 kg)    Goal Weight: Short Term 170 lb (77.1 kg)    Goal Weight: Long Term 155 lb (70.3 kg)    Expected Outcomes Short Term: Continue to assess and modify interventions until short term weight is achieved;Long Term: Adherence to nutrition and physical activity/exercise program aimed toward attainment of established weight goal;Weight Loss: Understanding of general recommendations for a balanced deficit meal plan, which promotes 1-2 lb weight loss per week and includes a negative energy balance of [phone] kcal/d;Understanding recommendations for meals to include 15-35% energy as protein, 25-35% energy from  fat, 35-60% energy from carbohydrates, less than 200mg  of dietary cholesterol, 20-35 gm of total fiber daily;Understanding of distribution of calorie intake throughout the day with the consumption of 4-5 meals/snacks    Diabetes Yes    Intervention Provide education about signs/symptoms and action to take for hypo/hyperglycemia.;Provide education about proper nutrition, including hydration, and aerobic/resistive exercise prescription along with prescribed medications to achieve blood glucose in normal ranges: Fasting glucose 65-99 mg/dL    Expected Outcomes Short Term: Participant verbalizes understanding of the signs/symptoms and immediate care of hyper/hypoglycemia, proper foot care and importance of medication, aerobic/resistive exercise and nutrition plan for blood  glucose control.;Long Term: Attainment of HbA1C < 7%.    Hypertension Yes    Intervention Provide education on lifestyle modifcations including regular physical activity/exercise, weight management, moderate sodium restriction and increased consumption of fresh fruit, vegetables, and low fat dairy, alcohol moderation, and smoking cessation.;Monitor prescription use compliance.    Expected Outcomes Short Term: Continued assessment and intervention until BP is < 140/21mm HG in hypertensive participants. < 130/77mm HG in hypertensive participants with diabetes, heart failure or chronic kidney disease.;Long Term: Maintenance of blood pressure at goal levels.    Lipids Yes    Intervention Provide education and support for participant on nutrition & aerobic/resistive exercise along with prescribed medications to achieve LDL 70mg , HDL >40mg .    Expected Outcomes Short Term: Participant states understanding of desired cholesterol values and is compliant with medications prescribed. Participant is following exercise prescription and nutrition guidelines.;Long Term: Cholesterol controlled with medications as prescribed, with individualized exercise RX and with personalized nutrition plan. Value goals: LDL < 70mg , HDL > 40 mg.             Education:Diabetes - Individual verbal and written instruction to review signs/symptoms of diabetes, desired ranges of glucose level fasting, after meals and with exercise. Acknowledge that pre and post exercise glucose checks will be done for 3 sessions at entry of program. Flowsheet Row Cardiac Rehab from 07/04/2023 in [facility] Cardiac and Pulmonary Rehab  Date 05/31/23  Educator MB  Instruction Review Code 1- Verbalizes Understanding       Core Components/Risk Factors/Patient Goals Review:   Goals and Risk Factor Review     Row Name 06/25/23 1443             Core Components/Risk Factors/Patient Goals Review   Personal Goals Review Weight  Management/Obesity;Diabetes;Hypertension       Review Savir continues to work towards a weight loss goal of 160 lbs and he weighed in today at 173.7 lbs. He checks his blood sugar everyday and takes his trulicity once a week which keeps his sugars well regulated. He also has his own BP cuff at home which he uses to check his BP regularly. He reports that his BP has stayed within normal ranges.       Expected Outcomes Short: Continue to work towards weight goal through diet and exercise. Long: Continue to manage lifestyle risk factors.                Core Components/Risk Factors/Patient Goals at Discharge (Final Review):   Goals and Risk Factor Review - 06/25/23 1443       Core Components/Risk Factors/Patient Goals Review   Personal Goals Review Weight Management/Obesity;Diabetes;Hypertension    Review Adren continues to work towards a weight loss goal of 160 lbs and he weighed in today at 173.7 lbs. He checks his blood sugar everyday and takes his trulicity once a week which  keeps his sugars well regulated. He also has his own BP cuff at home which he uses to check his BP regularly. He reports that his BP has stayed within normal ranges.    Expected Outcomes Short: Continue to work towards weight goal through diet and exercise. Long: Continue to manage lifestyle risk factors.             ITP Comments:  ITP Comments     Row Name 05/24/23 1457 05/31/23 1611 06/06/23 1405 06/13/23 1255 07/11/23 0811   ITP Comments Virtual orientation call completed today. he has an appointment on Date: 05/31/2023  for EP eval and gym Orientation.  Documentation of diagnosis can be found in [facility] Date: 05/02/2023 . Completed and gym orientation. Initial ITP created and sent for review to Dr. Bethann Punches, Medical Director. First full day of exercise!  Patient was oriented to gym and equipment including functions, settings, policies, and procedures.  Patient's individual exercise prescription and treatment  plan were reviewed.  All starting workloads were established based on the results of the 6 minute walk test done at initial orientation visit.  The plan for exercise progression was also introduced and progression will be customized based on patient's performance and goals. 30 Day review completed. Medical Director ITP review done, changes made as directed, and signed approval by Medical Director. 30 Day review completed. Medical Director ITP review done, changes made as directed, and signed approval by Medical Director.            Comments: 30 day review

## 2023-07-12 ENCOUNTER — Encounter: Payer: 59 | Admitting: *Deleted

## 2023-07-12 DIAGNOSIS — Z955 Presence of coronary angioplasty implant and graft: Secondary | ICD-10-CM

## 2023-07-12 DIAGNOSIS — Z48812 Encounter for surgical aftercare following surgery on the circulatory system: Secondary | ICD-10-CM | POA: Diagnosis not present

## 2023-07-12 NOTE — Progress Notes (Signed)
Daily Session Note  Patient Details  Name: Frank Wong MRN: [id number] Date of Birth: Dec 15, 1955 Referring Provider:   Flowsheet Row Cardiac Rehab from 05/31/2023 in [facility] Cardiac and Pulmonary Rehab  Referring Provider [name], MD       Encounter Date: 07/12/2023  Check In:  Session Check In - 07/12/23 1359       Check-In   Supervising physician immediately available to respond to emergencies See telemetry face sheet for immediately available ER MD    Location [facility]-Cardiac & Pulmonary Rehab    Staff Present Maxon Suzzette Righter, Exercise Physiologist;Zillah Alexie [name] RN,BSN;[name], BS, Exercise Physiologist;Joseph Reino Kent RCP,RRT,BSRT    Virtual Visit No    Medication changes reported     No    Fall or balance concerns reported    No    Warm-up and Cool-down Performed on first and last piece of equipment    Resistance Training Performed Yes    VAD Patient? No    PAD/SET Patient? No      Pain Assessment   Currently in Pain? No/denies                Social History   Tobacco Use  Smoking Status Former   Current packs/day: 0.00   Types: Cigarettes   Quit date: 05/29/1993   Years since quitting: 30.1  Smokeless Tobacco Former   Types: Snuff, Chew   Quit date: 05/29/1978    Goals Met:  Independence with exercise equipment Exercise tolerated well No report of concerns or symptoms today Strength training completed today  Goals Unmet:  Not Applicable  Comments: Pt able to follow exercise prescription today without complaint.  Will continue to monitor for progression.    Dr. Bethann Punches is Medical Director for [facility] Cardiac Rehabilitation.  Dr. Vida Rigger is Medical Director for [facility] Pulmonary Rehabilitation.

## 2023-07-16 ENCOUNTER — Encounter: Payer: 59 | Admitting: *Deleted

## 2023-07-16 DIAGNOSIS — Z48812 Encounter for surgical aftercare following surgery on the circulatory system: Secondary | ICD-10-CM | POA: Diagnosis not present

## 2023-07-16 DIAGNOSIS — Z951 Presence of aortocoronary bypass graft: Secondary | ICD-10-CM

## 2023-07-16 DIAGNOSIS — Z955 Presence of coronary angioplasty implant and graft: Secondary | ICD-10-CM

## 2023-07-16 NOTE — Progress Notes (Signed)
Daily Session Note  Patient Details  Name: Frank Wong MRN: [id number] Date of Birth: 1955/12/07 Referring Provider:   Flowsheet Row Cardiac Rehab from 05/31/2023 in [facility] Cardiac and Pulmonary Rehab  Referring Provider [name], MD       Encounter Date: 07/16/2023  Check In:  Session Check In - 07/16/23 1415       Check-In   Supervising physician immediately available to respond to emergencies See telemetry face sheet for immediately available ER MD    Location [facility]-Cardiac & Pulmonary Rehab    Staff Present Susann Givens RN,BSN;Joseph [facility] Manya Silvas BS, Exercise Physiologist;Kelly Cloretta Ned, ACSM CEP, Exercise Physiologist;[name], BS, Exercise Physiologist    Virtual Visit No    Medication changes reported     No    Fall or balance concerns reported    No    Warm-up and Cool-down Performed on first and last piece of equipment    Resistance Training Performed Yes    VAD Patient? No    PAD/SET Patient? No      Pain Assessment   Currently in Pain? No/denies                Social History   Tobacco Use  Smoking Status Former   Current packs/day: 0.00   Types: Cigarettes   Quit date: 05/29/1993   Years since quitting: 30.1  Smokeless Tobacco Former   Types: Snuff, Chew   Quit date: 05/29/1978    Goals Met:  Independence with exercise equipment Exercise tolerated well No report of concerns or symptoms today Strength training completed today  Goals Unmet:  Not Applicable  Comments: Pt able to follow exercise prescription today without complaint.  Will continue to monitor for progression.    Dr. Bethann Punches is Medical Director for [facility] Cardiac Rehabilitation.  Dr. Vida Rigger is Medical Director for [facility] Pulmonary Rehabilitation.

## 2023-07-19 ENCOUNTER — Encounter: Payer: 59 | Admitting: *Deleted

## 2023-07-19 DIAGNOSIS — Z48812 Encounter for surgical aftercare following surgery on the circulatory system: Secondary | ICD-10-CM | POA: Diagnosis not present

## 2023-07-19 DIAGNOSIS — Z955 Presence of coronary angioplasty implant and graft: Secondary | ICD-10-CM

## 2023-07-19 DIAGNOSIS — Z951 Presence of aortocoronary bypass graft: Secondary | ICD-10-CM

## 2023-07-19 NOTE — Progress Notes (Signed)
Daily Session Note  Patient Details  Name: Frank Wong MRN: [id number] Date of Birth: 26-Jan-1956 Referring Provider:   Flowsheet Row Cardiac Rehab from 05/31/2023 in [facility] Cardiac and Pulmonary Rehab  Referring Provider [name], MD       Encounter Date: 07/19/2023  Check In:  Session Check In - 07/19/23 1410       Check-In   Supervising physician immediately available to respond to emergencies See telemetry face sheet for immediately available ER MD    Location [facility]-Cardiac & Pulmonary Rehab    Staff Present Cora Collum, RN, BSN, CCRP;Margaret Best, MS, Exercise Physiologist;[name] BS, Exercise Physiologist    Virtual Visit No    Medication changes reported     No    Fall or balance concerns reported    No    Warm-up and Cool-down Performed on first and last piece of equipment    Resistance Training Performed Yes    VAD Patient? No    PAD/SET Patient? No      Pain Assessment   Currently in Pain? No/denies                Social History   Tobacco Use  Smoking Status Former   Current packs/day: 0.00   Types: Cigarettes   Quit date: 05/29/1993   Years since quitting: 30.1  Smokeless Tobacco Former   Types: Snuff, Chew   Quit date: 05/29/1978    Goals Met:  Independence with exercise equipment Exercise tolerated well No report of concerns or symptoms today  Goals Unmet:  Not Applicable  Comments: Pt able to follow exercise prescription today without complaint.  Will continue to monitor for progression.    Dr. Bethann Punches is Medical Director for [facility] Cardiac Rehabilitation.  Dr. Vida Rigger is Medical Director for [facility] Pulmonary Rehabilitation.

## 2023-07-24 ENCOUNTER — Ambulatory Visit: Payer: 59

## 2023-07-25 ENCOUNTER — Encounter: Payer: 59 | Admitting: *Deleted

## 2023-07-25 DIAGNOSIS — Z955 Presence of coronary angioplasty implant and graft: Secondary | ICD-10-CM

## 2023-07-25 DIAGNOSIS — Z48812 Encounter for surgical aftercare following surgery on the circulatory system: Secondary | ICD-10-CM | POA: Diagnosis not present

## 2023-07-25 NOTE — Progress Notes (Signed)
 Daily Session Note  Patient Details  Name: Frank Wong MRN: [id number] Date of Birth: 12-19-1955 Referring Provider:   Flowsheet Row Cardiac Rehab from 05/31/2023 in [facility] Cardiac and Pulmonary Rehab  Referring Provider [name], MD       Encounter Date: 07/25/2023  Check In:  Session Check In - 07/25/23 1354       Check-In   Supervising physician immediately available to respond to emergencies See telemetry face sheet for immediately available ER MD    Location [facility]-Cardiac & Pulmonary Rehab    Staff Present Susann Givens RN,BSN;[name] BS, Exercise Physiologist;Margaret Best, MS, Exercise Physiologist;[name], BS, Exercise Physiologist    Virtual Visit No    Medication changes reported     Yes    Comments increased isosorbide to 60mg     Fall or balance concerns reported    No    Warm-up and Cool-down Performed on first and last piece of equipment    Resistance Training Performed Yes    VAD Patient? No    PAD/SET Patient? No      Pain Assessment   Currently in Pain? No/denies                Social History   Tobacco Use  Smoking Status Former   Current packs/day: 0.00   Types: Cigarettes   Quit date: 05/29/1993   Years since quitting: 30.1  Smokeless Tobacco Former   Types: Snuff, Chew   Quit date: 05/29/1978    Goals Met:  Independence with exercise equipment Exercise tolerated well No report of concerns or symptoms today Strength training completed today  Goals Unmet:  Not Applicable  Comments: Pt able to follow exercise prescription today without complaint.  Will continue to monitor for progression.    Dr. Bethann Punches is Medical Director for [facility] Cardiac Rehabilitation.  Dr. Vida Rigger is Medical Director for [facility] Pulmonary Rehabilitation.

## 2023-07-26 ENCOUNTER — Encounter: Payer: 59 | Admitting: *Deleted

## 2023-07-26 DIAGNOSIS — Z955 Presence of coronary angioplasty implant and graft: Secondary | ICD-10-CM

## 2023-07-26 DIAGNOSIS — Z48812 Encounter for surgical aftercare following surgery on the circulatory system: Secondary | ICD-10-CM | POA: Diagnosis not present

## 2023-07-26 NOTE — Progress Notes (Signed)
 Daily Session Note  Patient Details  Name: Frank Wong MRN: [id number] Date of Birth: Oct 10, 1955 Referring Provider:   Flowsheet Row Cardiac Rehab from 05/31/2023 in [facility] Cardiac and Pulmonary Rehab  Referring Provider [name], MD       Encounter Date: 07/26/2023  Check In:  Session Check In - 07/26/23 1359       Check-In   Supervising physician immediately available to respond to emergencies See telemetry face sheet for immediately available ER MD    Location [facility]-Cardiac & Pulmonary Rehab    Staff Present Susann Givens RN,BSN;Joseph Weyman Pedro, [place], Exercise Physiologist    Virtual Visit No    Medication changes reported     No    Fall or balance concerns reported    No    Warm-up and Cool-down Performed on first and last piece of equipment    Resistance Training Performed Yes    VAD Patient? No    PAD/SET Patient? No      Pain Assessment   Currently in Pain? No/denies                Social History   Tobacco Use  Smoking Status Former   Current packs/day: 0.00   Types: Cigarettes   Quit date: 05/29/1993   Years since quitting: 30.1  Smokeless Tobacco Former   Types: Snuff, Chew   Quit date: 05/29/1978    Goals Met:  Independence with exercise equipment Exercise tolerated well No report of concerns or symptoms today Strength training completed today  Goals Unmet:  Not Applicable  Comments: Pt able to follow exercise prescription today without complaint.  Will continue to monitor for progression.    Dr. Bethann Punches is Medical Director for [facility] Cardiac Rehabilitation.  Dr. Vida Rigger is Medical Director for [facility] Pulmonary Rehabilitation.

## 2023-07-27 ENCOUNTER — Other Ambulatory Visit: Payer: Self-pay

## 2023-07-27 ENCOUNTER — Emergency Department: Payer: 59

## 2023-07-27 ENCOUNTER — Encounter: Payer: 59 | Admitting: *Deleted

## 2023-07-27 ENCOUNTER — Emergency Department
Admission: EM | Admit: 2023-07-27 | Discharge: 2023-07-27 | Disposition: A | Discharge status: Home / Self Care | Payer: 59 | Attending: Emergency Medicine | Admitting: Emergency Medicine

## 2023-07-27 DIAGNOSIS — I2510 Atherosclerotic heart disease of native coronary artery without angina pectoris: Secondary | ICD-10-CM | POA: Diagnosis not present

## 2023-07-27 DIAGNOSIS — D72829 Elevated white blood cell count, unspecified: Secondary | ICD-10-CM | POA: Diagnosis not present

## 2023-07-27 DIAGNOSIS — Z955 Presence of coronary angioplasty implant and graft: Secondary | ICD-10-CM | POA: Insufficient documentation

## 2023-07-27 DIAGNOSIS — Z951 Presence of aortocoronary bypass graft: Secondary | ICD-10-CM

## 2023-07-27 DIAGNOSIS — J449 Chronic obstructive pulmonary disease, unspecified: Secondary | ICD-10-CM | POA: Diagnosis not present

## 2023-07-27 DIAGNOSIS — N189 Chronic kidney disease, unspecified: Secondary | ICD-10-CM | POA: Insufficient documentation

## 2023-07-27 DIAGNOSIS — Z48812 Encounter for surgical aftercare following surgery on the circulatory system: Secondary | ICD-10-CM | POA: Diagnosis not present

## 2023-07-27 DIAGNOSIS — I129 Hypertensive chronic kidney disease with stage 1 through stage 4 chronic kidney disease, or unspecified chronic kidney disease: Secondary | ICD-10-CM | POA: Insufficient documentation

## 2023-07-27 DIAGNOSIS — I959 Hypotension, unspecified: Secondary | ICD-10-CM | POA: Insufficient documentation

## 2023-07-27 DIAGNOSIS — J069 Acute upper respiratory infection, unspecified: Secondary | ICD-10-CM | POA: Diagnosis not present

## 2023-07-27 DIAGNOSIS — J019 Acute sinusitis, unspecified: Secondary | ICD-10-CM | POA: Insufficient documentation

## 2023-07-27 LAB — BASIC METABOLIC PANEL
Anion gap: 10 (ref 5–15)
BUN: 18 mg/dL (ref 8–23)
CO2: 24 mmol/L (ref 22–32)
Calcium: 9.1 mg/dL (ref 8.9–10.3)
Chloride: 106 mmol/L (ref 98–111)
Creatinine, Ser: 1.26 mg/dL — ABNORMAL HIGH (ref 0.61–1.24)
GFR, Estimated: >60 mL/min (ref >60)
Glucose, Bld: 99 mg/dL (ref 70–99)
Potassium: 4.1 mmol/L (ref 3.5–5.1)
Sodium: 140 mmol/L (ref 135–145)

## 2023-07-27 LAB — CBC
HCT: 44.7 % (ref 39.0–52.0)
Hemoglobin: 15.7 g/dL (ref 13.0–17.0)
MCH: 33.7 pg (ref 26.0–34.0)
MCHC: 35.1 g/dL (ref 30.0–36.0)
MCV: 95.9 fL (ref 80.0–100.0)
Platelets: 199 10*3/uL (ref 150–400)
RBC: 4.66 MIL/uL (ref 4.22–5.81)
RDW: 13.2 % (ref 11.5–15.5)
WBC: 12.8 10*3/uL — ABNORMAL HIGH (ref 4.0–10.5)
nRBC: 0 % (ref 0.0–0.2)

## 2023-07-27 LAB — RESP PANEL BY RT-PCR (RSV, FLU A&B, COVID)  RVPGX2
Influenza A by PCR: NEGATIVE
Influenza B by PCR: NEGATIVE
Resp Syncytial Virus by PCR: NEGATIVE
SARS Coronavirus 2 by RT PCR: NEGATIVE

## 2023-07-27 LAB — TROPONIN I (HIGH SENSITIVITY): Troponin I (High Sensitivity): 8 ng/L (ref <18)

## 2023-07-27 MED ORDER — FLUTICASONE PROPIONATE 50 MCG/ACT NA SUSP: 1.0000 | Freq: Every day | NASAL | 0 refills | Status: DC

## 2023-07-27 MED ORDER — PSEUDOEPHEDRINE HCL 60 MG PO TABS: 60.0000 mg | ORAL_TABLET | Freq: Four times a day (QID) | ORAL | 0 refills | Status: AC | PRN

## 2023-07-27 MED ORDER — PSEUDOEPHEDRINE HCL 60 MG PO TABS: 60.0000 mg | ORAL_TABLET | Freq: Four times a day (QID) | ORAL | 0 refills | Status: DC | PRN

## 2023-07-27 MED ORDER — FLUTICASONE PROPIONATE 50 MCG/ACT NA SUSP: 1.0000 | Freq: Every day | NASAL | 0 refills | Status: AC

## 2023-07-27 NOTE — Progress Notes (Signed)
 Daily Session Note  Patient Details  Name: Frank Wong MRN: [id number] Date of Birth: 06/28/1955 Referring Provider:   Flowsheet Row Cardiac Rehab from 05/31/2023 in [facility] Cardiac and Pulmonary Rehab  Referring Provider [name], MD       Encounter Date: 07/27/2023  Check In:  Session Check In - 07/27/23 1110       Check-In   Supervising physician immediately available to respond to emergencies See telemetry face sheet for immediately available ER MD    Location [facility]-Cardiac & Pulmonary Rehab    Staff Present Susann Givens RN,BSN;Joseph Weyman Pedro, [place], Exercise Physiologist    Virtual Visit No    Medication changes reported     No    Fall or balance concerns reported    No    Warm-up and Cool-down Performed on first and last piece of equipment    Resistance Training Performed Yes    VAD Patient? No    PAD/SET Patient? No      Pain Assessment   Currently in Pain? No/denies                Social History   Tobacco Use  Smoking Status Former   Current packs/day: 0.00   Types: Cigarettes   Quit date: 05/29/1993   Years since quitting: 30.1  Smokeless Tobacco Former   Types: Snuff, Chew   Quit date: 05/29/1978    Goals Met:  Independence with exercise equipment Exercise tolerated well No report of concerns or symptoms today Strength training completed today  Goals Unmet:  Not Applicable  Comments: Pt able to follow exercise prescription today without complaint.  Will continue to monitor for progression.    Dr. Bethann Punches is Medical Director for [facility] Cardiac Rehabilitation.  Dr. Vida Rigger is Medical Director for [facility] Pulmonary Rehabilitation.

## 2023-07-27 NOTE — ED Notes (Signed)
 First Nurse Note: Pt to ED via [facility] clinic for hypotension. Pt went in for cold and flu like symptoms. Pts initial BP was 79/50. BP was rechecked manually and was 80/66 when rechecked again manually it was 100/60. Pt denies symptoms of dizziness.

## 2023-07-27 NOTE — Discharge Instructions (Addendum)
 Use the Flonase and pseudoephedrine as prescribed.  Continue taking all of your other medications normally.  Follow-up with your primary care doctor and your cardiologist.  Return to the ER for new, worsening, or persistent severe congestion cough, difficulty breathing, weakness or lightheadedness, or low blood pressure readings, or for any other new or worsening symptoms that concern you.

## 2023-07-27 NOTE — ED Provider Notes (Signed)
 [facility] Provider Note    Event Date/Time   First MD Initiated Contact with Patient 07/27/23 1716     (approximate)   History   Hypotension   HPI  Frank Wong is a 68 y.o. male with history of CAD status post stents, and CABG, COPD, CKD, hypertension, hyperlipidemia, and OSA who presents with low blood pressure noted at the walk-in clinic today.  The patient has had multiple symptoms over the last several days including nasal congestion, rhinorrhea, and frontal pressure in his head.  He has also had an intermittent cough and a low-grade temperature yesterday.  The patient reports that he chronically has had streaks of blood when he blows his nose.  This has been occurring for months and has not changed today.  He also has chronic chest tightness which has not changed today.  He reports chronic low blood pressure.  He denies feeling dizzy or lightheaded at all today.  Blood pressure at intake was 80/66, so the patient was sent to the ED.  I reviewed the past medical records.  The patient's most recent outpatient counter was on 2/26 with Dr. Juliann Pares from cardiology for chest pain and follow-up of his chronic cardiac conditions.  He is in cardiac rehab.   Physical Exam   Triage Vital Signs: ED Triage Vitals [07/27/23 1426]  Encounter Vitals Group     BP (!) 119/96     Systolic BP Percentile      Diastolic BP Percentile      Pulse Rate 76     Resp 17     Temp 98.4 F (36.9 C)     Temp Source Oral     SpO2 95 %     Weight 173 lb 8 oz (78.7 kg)     Height 5' 7.5" (1.715 m)     Head Circumference      Peak Flow      Pain Score 3     Pain Loc      Pain Education      Exclude from Growth Chart     Most recent vital signs: Vitals:   07/27/23 1737 07/27/23 1800  BP:  109/65  Pulse: 75 73  Resp:    Temp:    SpO2: 98% 96%     General: Alert, well-appearing, no distress.  CV:  Good peripheral perfusion.  Resp:  Normal effort.  Lungs  CTAB. Abd:  No distention.  Other:  No significant peripheral edema.   ED Results / Procedures / Treatments   Labs (all labs ordered are listed, but only abnormal results are displayed) Labs Reviewed  CBC - Abnormal; Notable for the following components:      Result Value   WBC 12.8 (*)    All other components within normal limits  BASIC METABOLIC PANEL - Abnormal; Notable for the following components:   Creatinine, Ser 1.26 (*)    All other components within normal limits  RESP PANEL BY RT-PCR (RSV, FLU A&B, COVID)  [id number]  TROPONIN I (HIGH SENSITIVITY)     EKG  ED ECG REPORT I, Dionne Bucy, the attending physician, personally viewed and interpreted this ECG.  Date: 07/27/2023 EKG Time: 1429 Rate: 70 Rhythm: normal sinus rhythm QRS Axis: Right axis Intervals: normal ST/T Wave abnormalities: normal Narrative Interpretation: no evidence of acute ischemia    RADIOLOGY  Chest x-ray: I independently viewed and interpreted the images; there is no focal consolidation or edema   PROCEDURES:  Critical  Care performed: No  Procedures   MEDICATIONS ORDERED IN ED: Medications - No data to display   IMPRESSION / MDM / ASSESSMENT AND PLAN / ED COURSE  I reviewed the triage vital signs and the nursing notes.  68 year old male presents with hypotension discovered at the walk-in clinic after he initially presented with nasal congestion and sinus pressure.  He has several other symptoms that are chronic and not acutely changed today.  On exam he is well-appearing.  His vital signs including blood pressure have been normal in the ED.  Physical exam is unremarkable for acute findings.  Differential diagnosis includes, but is not limited to, viral syndrome, dehydration/hypovolemia, medication side effects.  The patient reports that the low blood pressure is generally chronic.  Patient's presentation is most consistent with acute complicated illness / injury requiring  diagnostic workup.  BMP is unremarkable.  CBC shows mild leukocytosis.  Respiratory panel is negative.  Troponin is negative.  Given the duration of the symptoms there is no indication for repeat.  I also obtained a chest x-ray which is negative for acute findings.  ----------------------------------------- 6:27 PM on 07/27/2023 -----------------------------------------  At this time, the patient is stable for discharge home.  His blood pressure has remained stable.  He states he feels comfortable and is well to go home.  I counseled him on the results of the workup.  I will give him a prescription for Flonase and pseudoephedrine for acute sinusitis.  Based on his clinical presentation and the duration of the symptoms there is no indication for antibiotics.  I gave strict return precautions and he expressed understanding.   FINAL CLINICAL IMPRESSION(S) / ED DIAGNOSES   Final diagnoses:  Upper respiratory tract infection, unspecified type  Acute sinusitis, recurrence not specified, unspecified location  Hypotension, unspecified hypotension type     Rx / DC Orders   ED Discharge Orders          Ordered    fluticasone (FLONASE) 50 MCG/ACT nasal spray  Daily        07/27/23 1825    pseudoephedrine (SUDAFED) 60 MG tablet  Every 6 hours PRN        07/27/23 1825             Note:  This document was prepared using Dragon voice recognition software and may include unintentional dictation errors.    Dionne Bucy, MD 07/27/23 [phone]

## 2023-07-27 NOTE — ED Triage Notes (Addendum)
 Pt c/o nasal congestion/drainage x2-3. Reports frontal pressure, around eyes. Reports intermittent productive cough. Reports chest tightness ongoing since stent placed back in November. Reports SOB with exertion, h/o COPD. Reports fever - 99-157f x yesterday, took tylenol x last night. Denies n/v/d. Pt states that he has been having blood when blowing his nose in the ams x 3-4 months, on blood thinners. Denies dizziness, blurry vision. Pt states his bp is normally low but was told to come to the ED d/t his pressure being in the 80s.

## 2023-07-30 ENCOUNTER — Encounter: Payer: 59 | Attending: Cardiology | Admitting: *Deleted

## 2023-07-30 DIAGNOSIS — Z955 Presence of coronary angioplasty implant and graft: Secondary | ICD-10-CM | POA: Diagnosis not present

## 2023-07-30 DIAGNOSIS — Z48812 Encounter for surgical aftercare following surgery on the circulatory system: Secondary | ICD-10-CM | POA: Diagnosis present

## 2023-07-30 NOTE — Progress Notes (Signed)
 Daily Session Note  Patient Details  Name: Frank Wong MRN: [id number] Date of Birth: [birth year] Referring Provider:   Flowsheet Row Cardiac Rehab from 05/31/2023 in [facility] Cardiac and Pulmonary Rehab  Referring Provider [name], MD       Encounter Date: 07/30/2023  Check In:  Session Check In - 07/30/23 1412       Check-In   Supervising physician immediately available to respond to emergencies See telemetry face sheet for immediately available ER MD    Location [facility]-Cardiac & Pulmonary Rehab    Staff Present Susann Givens RN,BSN;Joseph Hollace Kinnier;Betsy Coder PhD, RN,CNS,CEN;[name], BS, Exercise Physiologist    Virtual Visit No    Medication changes reported     No    Fall or balance concerns reported    No    Warm-up and Cool-down Performed on first and last piece of equipment    Resistance Training Performed Yes    VAD Patient? No    PAD/SET Patient? No      Pain Assessment   Currently in Pain? No/denies                Social History   Tobacco Use  Smoking Status Former   Current packs/day: 0.00   Types: Cigarettes   Quit date: 05/29/1993   Years since quitting: 30.1  Smokeless Tobacco Former   Types: Snuff, Chew   Quit date: 05/29/1978    Goals Met:  Independence with exercise equipment Exercise tolerated well No report of concerns or symptoms today Strength training completed today  Goals Unmet:  Not Applicable  Comments: Pt able to follow exercise prescription today without complaint.  Will continue to monitor for progression.    Dr. Bethann Punches is Medical Director for [facility] Cardiac Rehabilitation.  Dr. Vida Rigger is Medical Director for [facility] Pulmonary Rehabilitation.

## 2023-08-02 ENCOUNTER — Encounter: Payer: 59 | Admitting: *Deleted

## 2023-08-02 DIAGNOSIS — Z48812 Encounter for surgical aftercare following surgery on the circulatory system: Secondary | ICD-10-CM | POA: Diagnosis not present

## 2023-08-02 DIAGNOSIS — Z955 Presence of coronary angioplasty implant and graft: Secondary | ICD-10-CM

## 2023-08-02 NOTE — Progress Notes (Signed)
 Daily Session Note  Patient Details  Name: Frank Wong MRN: [id number] Date of Birth: 08-14-1955 Referring Provider:   Flowsheet Row Cardiac Rehab from 05/31/2023 in [facility] Cardiac and Pulmonary Rehab  Referring Provider [name], MD       Encounter Date: 08/02/2023  Check In:  Session Check In - 08/02/23 1408       Check-In   Supervising physician immediately available to respond to emergencies See telemetry face sheet for immediately available ER MD    Location [facility]-Cardiac & Pulmonary Rehab    Staff Present Susann Givens RN,BSN;Joseph Weyman [place], [place], Exercise Physiologist;Margaret Best, MS, Exercise Physiologist    Virtual Visit No    Medication changes reported     No    Fall or balance concerns reported    No    Warm-up and Cool-down Performed on first and last piece of equipment    Resistance Training Performed Yes    VAD Patient? No    PAD/SET Patient? No      Pain Assessment   Currently in Pain? No/denies                Social History   Tobacco Use  Smoking Status Former   Current packs/day: 0.00   Types: Cigarettes   Quit date: 05/29/1993   Years since quitting: 30.1  Smokeless Tobacco Former   Types: Snuff, Chew   Quit date: 05/29/1978    Goals Met:  Independence with exercise equipment Exercise tolerated well No report of concerns or symptoms today Strength training completed today  Goals Unmet:  Not Applicable  Comments: Pt able to follow exercise prescription today without complaint.  Will continue to monitor for progression.    Dr. Bethann Punches is Medical Director for [facility] Cardiac Rehabilitation.  Dr. Vida Rigger is Medical Director for [facility] Pulmonary Rehabilitation.

## 2023-08-03 ENCOUNTER — Encounter

## 2023-08-03 DIAGNOSIS — Z955 Presence of coronary angioplasty implant and graft: Secondary | ICD-10-CM

## 2023-08-03 DIAGNOSIS — Z48812 Encounter for surgical aftercare following surgery on the circulatory system: Secondary | ICD-10-CM | POA: Diagnosis not present

## 2023-08-03 NOTE — Progress Notes (Signed)
 Daily Session Note  Patient Details  Name: Frank Wong MRN: [id number] Date of Birth: 1956/04/19 Referring Provider:   Flowsheet Row Cardiac Rehab from 05/31/2023 in [facility] Cardiac and Pulmonary Rehab  Referring Provider [name], MD       Encounter Date: 08/03/2023  Check In:  Session Check In - 08/03/23 1056       Check-In   Supervising physician immediately available to respond to emergencies See telemetry face sheet for immediately available ER MD    Location [facility]-Cardiac & Pulmonary Rehab    Staff Present Kelton Pillar RN,BSN,MPA;[name], BS, Exercise Physiologist;Joseph Hollace Kinnier    Virtual Visit No    Medication changes reported     No    Fall or balance concerns reported    No    Warm-up and Cool-down Performed on first and last piece of equipment    Resistance Training Performed Yes    VAD Patient? No    PAD/SET Patient? No      Pain Assessment   Currently in Pain? No/denies                Social History   Tobacco Use  Smoking Status Former   Current packs/day: 0.00   Types: Cigarettes   Quit date: 05/29/1993   Years since quitting: 30.2  Smokeless Tobacco Former   Types: Snuff, Chew   Quit date: 05/29/1978    Goals Met:  Independence with exercise equipment Exercise tolerated well No report of concerns or symptoms today Strength training completed today  Goals Unmet:  Not Applicable  Comments: Pt able to follow exercise prescription today without complaint.  Will continue to monitor for progression.    Dr. Bethann Punches is Medical Director for [facility] Cardiac Rehabilitation.  Dr. Vida Rigger is Medical Director for [facility] Pulmonary Rehabilitation.

## 2023-08-07 ENCOUNTER — Encounter: Admitting: *Deleted

## 2023-08-07 VITALS — Ht 67.5 in | Wt 172.6 lb

## 2023-08-07 DIAGNOSIS — Z48812 Encounter for surgical aftercare following surgery on the circulatory system: Secondary | ICD-10-CM | POA: Diagnosis not present

## 2023-08-07 DIAGNOSIS — Z955 Presence of coronary angioplasty implant and graft: Secondary | ICD-10-CM

## 2023-08-07 NOTE — Patient Instructions (Signed)
 Discharge Patient Instructions  Patient Details  Name: Frank Wong MRN: [id number] Date of Birth: 09/09/1955 Referring Provider:  Gracelyn Nurse, MD   Number of Visits: 36  Reason for Discharge:  Patient reached a stable level of exercise. Patient independent in their exercise. Patient has met program and personal goals.  Diagnosis:  Status post coronary artery stent placement  Initial Exercise Prescription:  Initial Exercise Prescription - 05/31/23 1600       Date of Initial Exercise RX and Referring Provider   Date 05/31/23    Referring Provider [name], MD      Oxygen   Maintain Oxygen Saturation 88% or higher      Treadmill   MPH 3.3    Grade 0.5    Minutes 15    METs 3.75      Elliptical   Level 3    Speed 3    Minutes 15    METs 3.76      REL-XR   Level 3    Speed 50    Minutes 15    METs 3.76      Prescription Details   Frequency (times per week) 3    Duration Progress to 30 minutes of continuous aerobic without signs/symptoms of physical distress      Intensity   THRR 40-80% of Max Heartrate 110-140    Ratings of Perceived Exertion 11-13    Perceived Dyspnea 0-4      Progression   Progression Continue to progress workloads to maintain intensity without signs/symptoms of physical distress.      Resistance Training   Training Prescription Yes    Weight 7 lb    Reps 10-15             Discharge Exercise Prescription (Final Exercise Prescription Changes):  Exercise Prescription Changes - 08/02/23 0800       Response to Exercise   Blood Pressure (Admit) 116/58    Blood Pressure (Exit) 114/62    Heart Rate (Admit) 75 bpm    Heart Rate (Exercise) 133 bpm    Heart Rate (Exit) 107 bpm    Rating of Perceived Exertion (Exercise) 15    Symptoms none    Duration Continue with 30 min of aerobic exercise without signs/symptoms of physical distress.    Intensity THRR unchanged      Progression   Progression Continue to  progress workloads to maintain intensity without signs/symptoms of physical distress.    Average METs 6.75      Resistance Training   Training Prescription Yes    Weight 10 lb    Reps 10-15      Interval Training   Interval Training No      Treadmill   MPH 4.1    Grade 11    Minutes 15    METs 10.36      Elliptical   Level 2    Speed 5    Minutes 15    METs 4.1      Oxygen   Maintain Oxygen Saturation 88% or higher             Functional Capacity:  6 Minute Walk     Row Name 05/31/23 1613 08/07/23 1114       6 Minute Walk   Phase Initial Discharge    Distance 1705 feet 2080 feet    Distance % Change -- 22 %    Distance Feet Change -- 375 ft    Walk Time 6  minutes 6 minutes    # of Rest Breaks 0 0    MPH 3.23 3.94    METS 3.76 4.61    RPE 8 12    Perceived Dyspnea  1 0    VO2 Peak 13.14 16.15    Symptoms No No    Resting HR 80 bpm 74 bpm    Resting BP 100/60 116/70    Resting Oxygen Saturation  93 % 94 %    Exercise Oxygen Saturation  during 6 min walk 97 % 89 %    Max Ex. HR 108 bpm 105 bpm    Max Ex. BP 100/60 128/66    2 Minute Post BP 98/60 --            Nutrition & Weight - Outcomes:  Pre Biometrics - 05/31/23 1623       Pre Biometrics   Height 5' 7.5" (1.715 m)    Weight 173 lb 9.6 oz (78.7 kg)    Waist Circumference 39 inches    Hip Circumference 36.5 inches    Waist to Hip Ratio 1.07 %    BMI (Calculated) 26.77    Single Leg Stand 19.3 seconds             Post Biometrics - 08/07/23 1117        Post  Biometrics   Height 5' 7.5" (1.715 m)    Weight 172 lb 9.6 oz (78.3 kg)    Waist Circumference 37 inches    Hip Circumference 36 inches    Waist to Hip Ratio 1.03 %    BMI (Calculated) 26.62    Single Leg Stand 27.5 seconds             Nutrition:  Nutrition Therapy & Goals - 05/31/23 1625       Nutrition Therapy   RD appointment deferred Yes      Personal Nutrition Goals   Nutrition Goal RD appointment  deferred      Intervention Plan   Intervention Prescribe, educate and counsel regarding individualized specific dietary modifications aiming towards targeted core components such as weight, hypertension, lipid management, diabetes, heart failure and other comorbidities.    Expected Outcomes Short Term Goal: Understand basic principles of dietary content, such as calories, fat, sodium, cholesterol and nutrients.

## 2023-08-07 NOTE — Progress Notes (Signed)
 Daily Session Note  Patient Details  Name: Frank Wong MRN: [id number] Date of Birth: February 05, 1956 Referring Provider:   Flowsheet Row Cardiac Rehab from 05/31/2023 in [facility] Cardiac and Pulmonary Rehab  Referring Provider [name], MD       Encounter Date: 08/07/2023  Check In:  Session Check In - 08/07/23 1147       Check-In   Supervising physician immediately available to respond to emergencies See telemetry face sheet for immediately available ER MD    Location [facility]-Cardiac & Pulmonary Rehab    Staff Present Rory Percy, MS, Exercise Physiologist;Othello Sgroi, RN, BSN, CCRP;Laureen Manson Passey, BS, RRT, CPFT;[name], BS, Exercise Physiologist    Virtual Visit No    Medication changes reported     No    Fall or balance concerns reported    No    Warm-up and Cool-down Performed on first and last piece of equipment    Resistance Training Performed Yes    VAD Patient? No    PAD/SET Patient? No      Pain Assessment   Currently in Pain? No/denies                Social History   Tobacco Use  Smoking Status Former   Current packs/day: 0.00   Types: Cigarettes   Quit date: 05/29/1993   Years since quitting: 30.2  Smokeless Tobacco Former   Types: Snuff, Chew   Quit date: 05/29/1978    Goals Met:  Independence with exercise equipment Exercise tolerated well No report of concerns or symptoms today  Goals Unmet:  Not Applicable  Comments: Pt able to follow exercise prescription today without complaint.  Will continue to monitor for progression.    Dr. Bethann Punches is Medical Director for [facility] Cardiac Rehabilitation.  Dr. Vida Rigger is Medical Director for [facility] Pulmonary Rehabilitation.

## 2023-08-08 ENCOUNTER — Encounter: Payer: 59 | Admitting: *Deleted

## 2023-08-08 DIAGNOSIS — Z951 Presence of aortocoronary bypass graft: Secondary | ICD-10-CM

## 2023-08-08 DIAGNOSIS — Z955 Presence of coronary angioplasty implant and graft: Secondary | ICD-10-CM

## 2023-08-08 DIAGNOSIS — Z48812 Encounter for surgical aftercare following surgery on the circulatory system: Secondary | ICD-10-CM | POA: Diagnosis not present

## 2023-08-08 NOTE — Progress Notes (Signed)
 Daily Session Note  Patient Details  Name: KENNEITH STIEF MRN: [id number] Date of Birth: 03/10/1956 Referring Provider:   Flowsheet Row Cardiac Rehab from 05/31/2023 in [facility] Cardiac and Pulmonary Rehab  Referring Provider [name], MD       Encounter Date: 08/08/2023  Check In:  Session Check In - 08/08/23 1417       Check-In   Supervising physician immediately available to respond to emergencies See telemetry face sheet for immediately available ER MD    Location [facility]-Cardiac & Pulmonary Rehab    Staff Present Susann Givens RN,BSN;Susanne Bice, RN, BSN, CCRP;Kelly Hayes BS, ACSM CEP, Exercise Physiologist;[name], BS, Exercise Physiologist    Virtual Visit No    Medication changes reported     No    Fall or balance concerns reported    No    Warm-up and Cool-down Performed on first and last piece of equipment    Resistance Training Performed Yes    VAD Patient? No    PAD/SET Patient? No      Pain Assessment   Currently in Pain? No/denies                Social History   Tobacco Use  Smoking Status Former   Current packs/day: 0.00   Types: Cigarettes   Quit date: 05/29/1993   Years since quitting: 30.2  Smokeless Tobacco Former   Types: Snuff, Chew   Quit date: 05/29/1978    Goals Met:  Independence with exercise equipment Exercise tolerated well No report of concerns or symptoms today Strength training completed today  Goals Unmet:  Not Applicable  Comments: Pt able to follow exercise prescription today without complaint.  Will continue to monitor for progression.    Dr. Bethann Punches is Medical Director for [facility] Cardiac Rehabilitation.  Dr. Vida Rigger is Medical Director for [facility] Pulmonary Rehabilitation.

## 2023-08-08 NOTE — Progress Notes (Signed)
 Cardiac Individual Treatment Plan  Patient Details  Name: Frank Wong MRN: [id number] Date of Birth: 1955/07/26 Referring Provider:   Flowsheet Row Cardiac Rehab from 05/31/2023 in [facility] Cardiac and Pulmonary Rehab  Referring Provider [name], MD       Initial Encounter Date:  Flowsheet Row Cardiac Rehab from 05/31/2023 in [facility] Cardiac and Pulmonary Rehab  Date 05/31/23       Visit Diagnosis: Status post coronary artery stent placement  S/P CABG x 2  Patient's Home Medications on Admission:  Current Outpatient Medications:    acidophilus (RISAQUAD) CAPS capsule, Take 1 capsule by mouth daily., Disp: , Rfl:    amphetamine-dextroamphetamine (ADDERALL XR) 10 MG 24 hr capsule, Take 10 mg by mouth daily., Disp: , Rfl:    aspirin EC 81 MG tablet, Take 81 mg by mouth in the morning., Disp: , Rfl:    atorvastatin (LIPITOR) 80 MG tablet, Take 80 mg by mouth in the morning., Disp: , Rfl:    B Complex-C (B-COMPLEX WITH VITAMIN C) tablet, Take 1 tablet by mouth in the morning. (Patient not taking: Reported on 05/24/2023), Disp: , Rfl:    Cholecalciferol (VITAMIN D-1000 MAX ST) 25 MCG (1000 UT) tablet, Take 1,000 Units by mouth daily. (Patient not taking: Reported on 05/24/2023), Disp: , Rfl:    clonazePAM (KLONOPIN) 2 MG tablet, Take 1 mg by mouth at bedtime as needed (sleep/ anxiety)., Disp: , Rfl:    fexofenadine (ALLEGRA) 180 MG tablet, Take 180 mg by mouth in the morning., Disp: , Rfl:    fluticasone (FLONASE) 50 MCG/ACT nasal spray, Place 1 spray into both nostrils daily for 14 days., Disp: 15 mL, Rfl: 0   Fluticasone-Umeclidin-Vilant 100-62.5-25 MCG/ACT AEPB, Inhale 1 puff into the lungs daily., Disp: , Rfl:    gabapentin (NEURONTIN) 600 MG tablet, Take 600 mg by mouth 2 (two) times daily., Disp: , Rfl:    glipiZIDE (GLUCOTROL XL) 2.5 MG 24 hr tablet, Take 2.5 mg by mouth daily., Disp: , Rfl:    isosorbide mononitrate (IMDUR) 30 MG 24 hr tablet, Take 30 mg by mouth daily.,  Disp: , Rfl:    JARDIANCE 25 MG TABS tablet, Take 25 mg by mouth in the morning., Disp: , Rfl:    levalbuterol (XOPENEX HFA) 45 MCG/ACT inhaler, Inhale 1-2 puffs into the lungs every 6 (six) hours as needed for wheezing or shortness of breath., Disp: , Rfl:    levocetirizine (XYZAL) 5 MG tablet, Take 5 mg by mouth every evening., Disp: , Rfl:    lisinopril (ZESTRIL) 2.5 MG tablet, Take 2.5 mg by mouth daily., Disp: , Rfl:    magnesium oxide (MAG-OX) 400 MG tablet, Take 400 mg by mouth at bedtime. (Patient not taking: Reported on 05/24/2023), Disp: , Rfl:    metFORMIN (GLUCOPHAGE) 1000 MG tablet, Take 1,000 mg by mouth 2 (two) times daily with a meal. , Disp: , Rfl:    montelukast (SINGULAIR) 10 MG tablet, Take 10 mg by mouth at bedtime., Disp: , Rfl:    Multiple Vitamin (MULTIVITAMIN WITH MINERALS) TABS tablet, Take 1 tablet by mouth in the morning., Disp: , Rfl:    nitroGLYCERIN (NITROSTAT) 0.4 MG SL tablet, Place 0.4 mg under the tongue every 5 (five) minutes as needed for chest pain., Disp: , Rfl:    NON FORMULARY, Pt uses a bi-pap nightly, Disp: , Rfl:    pantoprazole (PROTONIX) 40 MG tablet, Take 40 mg by mouth at bedtime., Disp: , Rfl:    prasugrel (EFFIENT)  10 MG TABS tablet, Take 1 tablet (10 mg total) by mouth daily., Disp: 30 tablet, Rfl: 0   pseudoephedrine (SUDAFED) 60 MG tablet, Take 1 tablet (60 mg total) by mouth every 6 (six) hours as needed., Disp: 30 tablet, Rfl: 0   sertraline (ZOLOFT) 25 MG tablet, Take 25 mg by mouth daily., Disp: , Rfl:    Testosterone 1.62 % GEL, APPLY TWO (2) PUMPS TOPICALLY IN THE MORNING TO THE SHOULDERS AS DIRECTED (Patient not taking: Reported on 04/25/2023), Disp: 75 g, Rfl: 2   traZODone (DESYREL) 50 MG tablet, Take 50 mg by mouth at bedtime., Disp: , Rfl:   Past Medical History: Past Medical History:  Diagnosis Date   Alcoholism (HCC)    Asthma    CAD (coronary artery disease)    Cardiac arrest (HCC)    Chronic cardiac arrhythmia    Chronic  kidney disease    COPD (chronic obstructive pulmonary disease) (HCC)    Depression    Diabetes mellitus without complication (HCC)    Essential tremor    GERD (gastroesophageal reflux disease)    Heart attack (HCC)    Heart murmur    Hyperlipidemia    Hypertension    IBS (irritable bowel syndrome)    Shoulder problem 2010   right   Sinoatrial node dysfunction (HCC)    Sleep apnea     Tobacco Use: Social History   Tobacco Use  Smoking Status Former   Current packs/day: 0.00   Types: Cigarettes   Quit date: 05/29/1993   Years since quitting: 30.2  Smokeless Tobacco Former   Types: Snuff, Chew   Quit date: 05/29/1978    Labs: Review Flowsheet        No data to display           Exercise Target Goals: Exercise Program Goal: Individual exercise prescription set using results from initial 6 min walk test and THRR while considering  patient's activity barriers and safety.   Exercise Prescription Goal: Initial exercise prescription builds to 30-45 minutes a day of aerobic activity, 2-3 days per week.  Home exercise guidelines will be given to patient during program as part of exercise prescription that the participant will acknowledge.   Education: Aerobic Exercise: - Group verbal and visual presentation on the components of exercise prescription. Introduces F.I.T.T principle from ACSM for exercise prescriptions.  Reviews F.I.T.T. principles of aerobic exercise including progression. Written material given at graduation. Flowsheet Row Cardiac Rehab from 10/19/2021 in [facility] Cardiac and Pulmonary Rehab  Date 09/28/21  Educator [facility]  Instruction Review Code 1- Verbalizes Understanding       Education: Resistance Exercise: - Group verbal and visual presentation on the components of exercise prescription. Introduces F.I.T.T principle from ACSM for exercise prescriptions  Reviews F.I.T.T. principles of resistance exercise including progression. Written material given at  graduation. Flowsheet Row Cardiac Rehab from 10/19/2021 in [facility] Cardiac and Pulmonary Rehab  Date 10/05/21  Educator [facility]  Instruction Review Code 1- Verbalizes Understanding        Education: Exercise & Equipment Safety: - Individual verbal instruction and demonstration of equipment use and safety with use of the equipment. Flowsheet Row Cardiac Rehab from 07/25/2023 in [facility] Cardiac and Pulmonary Rehab  Date 05/31/23  Educator [facility]  Instruction Review Code 1- Verbalizes Understanding       Education: Exercise Physiology & General Exercise Guidelines: - Group verbal and written instruction with models to review the exercise physiology of the cardiovascular system and associated critical values. Provides  general exercise guidelines with specific guidelines to those with heart or lung disease.  Flowsheet Row Cardiac Rehab from 10/19/2021 in [facility] Cardiac and Pulmonary Rehab  Date 09/21/21  Educator [facility]  Instruction Review Code 1- Verbalizes Understanding       Education: Flexibility, Balance, Mind/Body Relaxation: - Group verbal and visual presentation with interactive activity on the components of exercise prescription. Introduces F.I.T.T principle from ACSM for exercise prescriptions. Reviews F.I.T.T. principles of flexibility and balance exercise training including progression. Also discusses the mind body connection.  Reviews various relaxation techniques to help reduce and manage stress (i.e. Deep breathing, progressive muscle relaxation, and visualization). Balance handout provided to take home. Written material given at graduation. Flowsheet Row Cardiac Rehab from 12/29/2020 in [facility] Cardiac and Pulmonary Rehab  Date 12/01/20  Educator [facility]  Instruction Review Code 1- Verbalizes Understanding       Activity Barriers & Risk Stratification:  Activity Barriers & Cardiac Risk Stratification - 05/31/23 1614       Activity Barriers & Cardiac Risk Stratification   Activity Barriers  Shortness of Breath;Joint Problems   has COPD; joint issues: bilateral shoulders, bilateral hips, bilateral knees; L shoulder surgery scheduled   Cardiac Risk Stratification Moderate             6 Minute Walk:  6 Minute Walk     Row Name 05/31/23 1613 08/07/23 1114       6 Minute Walk   Phase Initial Discharge    Distance 1705 feet 2080 feet    Distance % Change -- 22 %    Distance Feet Change -- 375 ft    Walk Time 6 minutes 6 minutes    # of Rest Breaks 0 0    MPH 3.23 3.94    METS 3.76 4.61    RPE 8 12    Perceived Dyspnea  1 0    VO2 Peak 13.14 16.15    Symptoms No No    Resting HR 80 bpm 74 bpm    Resting BP 100/60 116/70    Resting Oxygen Saturation  93 % 94 %    Exercise Oxygen Saturation  during 6 min walk 97 % 89 %    Max Ex. HR 108 bpm 105 bpm    Max Ex. BP 100/60 128/66    2 Minute Post BP 98/60 --             Oxygen Initial Assessment:   Oxygen Re-Evaluation:   Oxygen Discharge (Final Oxygen Re-Evaluation):   Initial Exercise Prescription:  Initial Exercise Prescription - 05/31/23 1600       Date of Initial Exercise RX and Referring Provider   Date 05/31/23    Referring Provider [name], MD      Oxygen   Maintain Oxygen Saturation 88% or higher      Treadmill   MPH 3.3    Grade 0.5    Minutes 15    METs 3.75      Elliptical   Level 3    Speed 3    Minutes 15    METs 3.76      REL-XR   Level 3    Speed 50    Minutes 15    METs 3.76      Prescription Details   Frequency (times per week) 3    Duration Progress to 30 minutes of continuous aerobic without signs/symptoms of physical distress      Intensity   THRR 40-80% of Max Heartrate  110-140    Ratings of Perceived Exertion 11-13    Perceived Dyspnea 0-4      Progression   Progression Continue to progress workloads to maintain intensity without signs/symptoms of physical distress.      Resistance Training   Training Prescription Yes    Weight 7 lb     Reps 10-15             Perform Capillary Blood Glucose checks as needed.  Exercise Prescription Changes:   Exercise Prescription Changes     Row Name 05/31/23 1600 06/21/23 1100 07/05/23 0800 07/18/23 1100 08/02/23 0800     Response to Exercise   Blood Pressure (Admit) 100/60 110/60 102/58 110/64 116/58   Blood Pressure (Exercise) 100/60 160/62 152/62 -- --   Blood Pressure (Exit) 98/60 90/56 104/64 102/56 114/62   Heart Rate (Admit) 80 bpm 73 bpm 58 bpm 71 bpm 75 bpm   Heart Rate (Exercise) 108 bpm 138 bpm 155 bpm 140 bpm 133 bpm   Heart Rate (Exit) 86 bpm 101 bpm 108 bpm 115 bpm 107 bpm   Oxygen Saturation (Admit) 93 % -- -- -- --   Oxygen Saturation (Exercise) 97 % -- -- -- --   Oxygen Saturation (Exit) 96 % -- -- -- --   Rating of Perceived Exertion (Exercise) 8 15 16 15 15    Perceived Dyspnea (Exercise) 1 -- -- -- --   Symptoms none none none none none   Comments results First two weeks of exercise -- -- --   Duration Progress to 30 minutes of  aerobic without signs/symptoms of physical distress Continue with 30 min of aerobic exercise without signs/symptoms of physical distress. Continue with 30 min of aerobic exercise without signs/symptoms of physical distress. Continue with 30 min of aerobic exercise without signs/symptoms of physical distress. Continue with 30 min of aerobic exercise without signs/symptoms of physical distress.   Intensity THRR New THRR unchanged THRR unchanged THRR unchanged THRR unchanged     Progression   Progression Continue to progress workloads to maintain intensity without signs/symptoms of physical distress. Continue to progress workloads to maintain intensity without signs/symptoms of physical distress. Continue to progress workloads to maintain intensity without signs/symptoms of physical distress. Continue to progress workloads to maintain intensity without signs/symptoms of physical distress. Continue to progress workloads to maintain  intensity without signs/symptoms of physical distress.   Average METs 3.76 7.68 8.37 8.01 6.75     Resistance Training   Training Prescription -- Yes Yes Yes Yes   Weight -- 7 lb 8 lb 10 lb 10 lb   Reps -- 10-15 10-15 10-15 10-15     Interval Training   Interval Training -- No No Yes No   Equipment -- -- -- Treadmill --   Comments -- -- -- intervals of incline 8-11% --     Treadmill   MPH -- 4 4 4  4.1   Grade -- 11 12 11 11    Minutes -- 15 15 15 15    METs -- 10.13 10.68 10.13 10.36     Elliptical   Level -- 3 9 9 2    Speed -- 6 3.8 3.8 5   Minutes -- 15 15 15 15    METs -- -- 4.1 4.1 4.1     REL-XR   Level -- 5 8 8  --   Minutes -- 15 15 15  --   METs -- 7.5 7.5 7.5 --     Oxygen   Maintain Oxygen Saturation -- 88%  or higher 88% or higher 88% or higher 88% or higher            Exercise Comments:   Exercise Comments     Row Name 06/06/23 1405           Exercise Comments First full day of exercise!  Patient was oriented to gym and equipment including functions, settings, policies, and procedures.  Patient's individual exercise prescription and treatment plan were reviewed.  All starting workloads were established based on the results of the 6 minute walk test done at initial orientation visit.  The plan for exercise progression was also introduced and progression will be customized based on patient's performance and goals.                Exercise Goals and Review:   Exercise Goals     Row Name 05/31/23 1622             Exercise Goals   Increase Physical Activity Yes       Intervention Provide advice, education, support and counseling about physical activity/exercise needs.;Develop an individualized exercise prescription for aerobic and resistive training based on initial evaluation findings, risk stratification, comorbidities and participant's personal goals.       Expected Outcomes Long Term: Add in home exercise to make exercise part of routine and to  increase amount of physical activity.;Short Term: Attend rehab on a regular basis to increase amount of physical activity.;Long Term: Exercising regularly at least 3-5 days a week.       Increase Strength and Stamina Yes       Intervention Provide advice, education, support and counseling about physical activity/exercise needs.;Develop an individualized exercise prescription for aerobic and resistive training based on initial evaluation findings, risk stratification, comorbidities and participant's personal goals.       Expected Outcomes Short Term: Increase workloads from initial exercise prescription for resistance, speed, and METs.;Short Term: Perform resistance training exercises routinely during rehab and add in resistance training at home;Long Term: Improve cardiorespiratory fitness, muscular endurance and strength as measured by increased METs and functional capacity ( )       Able to understand and use rate of perceived exertion (RPE) scale Yes       Intervention Provide education and explanation on how to use RPE scale       Expected Outcomes Long Term:  Able to use RPE to guide intensity level when exercising independently;Short Term: Able to use RPE daily in rehab to express subjective intensity level       Able to understand and use Dyspnea scale Yes       Intervention Provide education and explanation on how to use Dyspnea scale       Expected Outcomes Short Term: Able to use Dyspnea scale daily in rehab to express subjective sense of shortness of breath during exertion;Long Term: Able to use Dyspnea scale to guide intensity level when exercising independently       Knowledge and understanding of Target Heart Rate Range (THRR) Yes       Intervention Provide education and explanation of THRR including how the numbers were predicted and where they are located for reference       Expected Outcomes Short Term: Able to state/look up THRR;Long Term: Able to use THRR to govern intensity when  exercising independently;Short Term: Able to use daily as guideline for intensity in rehab       Able to check pulse independently Yes       Intervention  Provide education and demonstration on how to check pulse in carotid and radial arteries.;Review the importance of being able to check your own pulse for safety during independent exercise       Expected Outcomes Short Term: Able to explain why pulse checking is important during independent exercise;Long Term: Able to check pulse independently and accurately       Understanding of Exercise Prescription Yes       Intervention Provide education, explanation, and written materials on patient's individual exercise prescription       Expected Outcomes Short Term: Able to explain program exercise prescription;Long Term: Able to explain home exercise prescription to exercise independently                Exercise Goals Re-Evaluation :  Exercise Goals Re-Evaluation     Row Name 06/06/23 1406 06/21/23 1128 07/05/23 0808 07/18/23 1126 08/02/23 0836     Exercise Goal Re-Evaluation   Exercise Goals Review Able to understand and use rate of perceived exertion (RPE) scale;Increase Physical Activity;Knowledge and understanding of Target Heart Rate Range (THRR);Understanding of Exercise Prescription;Increase Strength and Stamina;Able to check pulse independently Increase Physical Activity;Understanding of Exercise Prescription;Increase Strength and Stamina Increase Physical Activity;Understanding of Exercise Prescription;Increase Strength and Stamina Increase Physical Activity;Understanding of Exercise Prescription;Increase Strength and Stamina Increase Physical Activity;Understanding of Exercise Prescription;Increase Strength and Stamina   Comments Reviewed RPE and dyspnea scale, THR and program prescription with pt today.  Pt voiced understanding and was given a copy of goals to take home. Cephas is off to a good start in the program. He has done well on the  treadmill and has increased is workload up to a speed of 4 mph with an 11% incline. He also has done well at level 5 on the XR and level 3 on the elliptical. We will continue to monitor his progress in the program. Lily continues to do well in rehab. He recently increased his incline on the treadmill to 12% while maintaining a speed of 4 mph. He also improved to level 9 on the elliptical and level 8 on the XR, and increased to 8 lb hand weights for resistance training. We will continue to monitor his progress in the program. Idus is doing well in rehab. He has begun doing intervals of incline on the tradmill at 8-11% at a speed of 4 mph. He also continues to work at level 9 on the elliptical and level 8 on the XR. We will continue to monitor his progress in the program. Curby is doing well in rehab. He recently increased his treadmill workload to a speed of 4.1 mph and an incline of 11%. He also increased his speed on the elliptical to 5 mph at level 2. We will continue to monitor his progress in the program.   Expected Outcomes Short: Use RPE daily to regulate intensity.  Long: Follow program prescription in THR. Short: Continue to progressively increase workloads. Long: Continue exercise to improve strength and stamina. Short: Continue to progressively increase workloads. Long: Continue exercise to improve strength and stamina. Short: Continue to progressively increase workloads. Long: Continue exercise to improve strength and stamina. Short: Continue to progressively increase workloads. Long: Continue exercise to improve strength and stamina.            Discharge Exercise Prescription (Final Exercise Prescription Changes):  Exercise Prescription Changes - 08/02/23 0800       Response to Exercise   Blood Pressure (Admit) 116/58    Blood Pressure (Exit) 114/62  Heart Rate (Admit) 75 bpm    Heart Rate (Exercise) 133 bpm    Heart Rate (Exit) 107 bpm    Rating of Perceived Exertion (Exercise) 15     Symptoms none    Duration Continue with 30 min of aerobic exercise without signs/symptoms of physical distress.    Intensity THRR unchanged      Progression   Progression Continue to progress workloads to maintain intensity without signs/symptoms of physical distress.    Average METs 6.75      Resistance Training   Training Prescription Yes    Weight 10 lb    Reps 10-15      Interval Training   Interval Training No      Treadmill   MPH 4.1    Grade 11    Minutes 15    METs 10.36      Elliptical   Level 2    Speed 5    Minutes 15    METs 4.1      Oxygen   Maintain Oxygen Saturation 88% or higher             Nutrition:  Target Goals: Understanding of nutrition guidelines, daily intake of sodium 1500mg , cholesterol 200mg , calories 30% from fat and 7% or less from saturated fats, daily to have 5 or more servings of fruits and vegetables.  Education: All About Nutrition: -Group instruction provided by verbal, written material, interactive activities, discussions, models, and posters to present general guidelines for heart healthy nutrition including fat, fiber, MyPlate, the role of sodium in heart healthy nutrition, utilization of the nutrition label, and utilization of this knowledge for meal planning. Follow up email sent as well. Written material given at graduation. Flowsheet Row Cardiac Rehab from 07/25/2023 in [facility] Cardiac and Pulmonary Rehab  Education need identified 05/31/23  Date 06/13/23  [part 2 06/20/23]  Educator Ottis Stain  Instruction Review Code 1- Verbalizes Understanding       Biometrics:  Pre Biometrics - 05/31/23 1623       Pre Biometrics   Height 5' 7.5" (1.715 m)    Weight 173 lb 9.6 oz (78.7 kg)    Waist Circumference 39 inches    Hip Circumference 36.5 inches    Waist to Hip Ratio 1.07 %    BMI (Calculated) 26.77    Single Leg Stand 19.3 seconds             Post Biometrics - 08/07/23 1117        Post  Biometrics   Height 5' 7.5"  (1.715 m)    Weight 172 lb 9.6 oz (78.3 kg)    Waist Circumference 37 inches    Hip Circumference 36 inches    Waist to Hip Ratio 1.03 %    BMI (Calculated) 26.62    Single Leg Stand 27.5 seconds             Nutrition Therapy Plan and Nutrition Goals:  Nutrition Therapy & Goals - 05/31/23 1625       Nutrition Therapy   RD appointment deferred Yes      Personal Nutrition Goals   Nutrition Goal RD appointment deferred      Intervention Plan   Intervention Prescribe, educate and counsel regarding individualized specific dietary modifications aiming towards targeted core components such as weight, hypertension, lipid management, diabetes, heart failure and other comorbidities.    Expected Outcomes Short Term Goal: Understand basic principles of dietary content, such as calories, fat, sodium, cholesterol and nutrients.  Nutrition Assessments:  MEDIFICTS Score Key: >=70 Need to make dietary changes  40-70 Heart Healthy Diet <= 40 Therapeutic Level Cholesterol Diet  Flowsheet Row Cardiac Rehab from 05/31/2023 in [facility] Cardiac and Pulmonary Rehab  Picture Your Plate Total Score on Admission 67      Picture Your Plate Scores: <30 Unhealthy dietary pattern with much room for improvement. 41-50 Dietary pattern unlikely to meet recommendations for good health and room for improvement. 51-60 More healthful dietary pattern, with some room for improvement.  >60 Healthy dietary pattern, although there may be some specific behaviors that could be improved.    Nutrition Goals Re-Evaluation:  Nutrition Goals Re-Evaluation     Row Name 06/25/23 1440             Goals   Nutrition Goal RD appointment deferred                Nutrition Goals Discharge (Final Nutrition Goals Re-Evaluation):  Nutrition Goals Re-Evaluation - 06/25/23 1440       Goals   Nutrition Goal RD appointment deferred             Psychosocial: Target Goals: Acknowledge presence  or absence of significant depression and/or stress, maximize coping skills, provide positive support system. Participant is able to verbalize types and ability to use techniques and skills needed for reducing stress and depression.   Education: Stress, Anxiety, and Depression - Group verbal and visual presentation to define topics covered.  Reviews how body is impacted by stress, anxiety, and depression.  Also discusses healthy ways to reduce stress and to treat/manage anxiety and depression.  Written material given at graduation. Flowsheet Row Cardiac Rehab from 07/25/2023 in [facility] Cardiac and Pulmonary Rehab  Date 07/25/23  Educator [facility]  Instruction Review Code 1- [employer] Understanding       Education: Sleep Hygiene -Provides group verbal and written instruction about how sleep can affect your health.  Define sleep hygiene, discuss sleep cycles and impact of sleep habits. Review good sleep hygiene tips.    Initial Review & Psychosocial Screening:  Initial Psych Review & Screening - 05/24/23 1444       Initial Review   Current issues with Current Depression;Current Psychotropic Meds      Family Dynamics   Good Support System? Yes   wife and children     Barriers   Psychosocial barriers to participate in program There are no identifiable barriers or psychosocial needs.      Screening Interventions   Interventions Encouraged to exercise;To provide support and resources with identified psychosocial needs;Provide feedback about the scores to participant    Expected Outcomes Short Term goal: Utilizing psychosocial counselor, staff and physician to assist with identification of specific Stressors or current issues interfering with healing process. Setting desired goal for each stressor or current issue identified.;Long Term Goal: Stressors or current issues are controlled or eliminated.;Short Term goal: Identification and review with participant of any Quality of Life or Depression concerns  found by scoring the questionnaire.;Long Term goal: The participant improves quality of Life and PHQ9 Scores as seen by post scores and/or verbalization of changes             Quality of Life Scores:   Quality of Life - 05/31/23 1624       Quality of Life   Select Quality of Life      Quality of Life Scores   Health/Function Pre 25.8 %    Socioeconomic Pre 27.38 %  Psych/Spiritual Pre 28.43 %    Family Pre 27.6 %    GLOBAL Pre 26.94 %            Scores of 19 and below usually indicate a poorer quality of life in these areas.  A difference of  2-3 points is a clinically meaningful difference.  A difference of 2-3 points in the total score of the Quality of Life Index has been associated with significant improvement in overall quality of life, self-image, physical symptoms, and general health in studies assessing change in quality of life.  PHQ-9: Review Flowsheet  More data may exist      05/31/2023 10/21/2021 08/08/2021 01/05/2021 11/01/2020  Depression screen PHQ 2/9  Decreased Interest 1 1 0 0 0  Down, Depressed, Hopeless 0 1 0 1 0  PHQ - 2 Score 1 2 0 1 0  Altered sleeping 2 1 0 1 1  Tired, decreased energy 1 1 1 1 1   Change in appetite 1 1 1  0 0  Feeling bad or failure about yourself  0 0 0 0 0  Trouble concentrating 2 2 2 1 1   Moving slowly or fidgety/restless 1 2 0 0 0  Suicidal thoughts 0 0 0 0 0  PHQ-9 Score 8 9 4 4 3   Difficult doing work/chores Not difficult at all Not difficult at all Somewhat difficult Not difficult at all Somewhat difficult   Interpretation of Total Score  Total Score Depression Severity:  1-4 = Minimal depression, 5-9 = Mild depression, 10-14 = Moderate depression, 15-19 = Moderately severe depression, 20-27 = Severe depression   Psychosocial Evaluation and Intervention:  Psychosocial Evaluation - 05/24/23 1454       Psychosocial Evaluation & Interventions   Comments Wm is returning to the program after cardiac stent placement. He  is ready to complete the program without any barriers. HIs support system is his wife and children.  He is on Zoloft for depression and the meds help control his symptoms.  He is ready to get started.    Expected Outcomes Short: attend cardiac rehab for education and exercise. Long: develop and maintain positive self care habits.    Continue Psychosocial Services  Follow up required by staff             Psychosocial Re-Evaluation:  Psychosocial Re-Evaluation     Row Name 06/25/23 1440             Psychosocial Re-Evaluation   Current issues with History of Depression       Comments Thos reports no major stressors at this tie. He reports that he continues to take his anti-depressant medication and reports that it has worked well to manage his depression symptoms. He does report having trouble falling asleep but he has medications to help with that. His wife, kids, and dog are a good support sytem for him.       Expected Outcomes Short: Continue to exercise for mental boost. Long: Continue to maintain positive outlook.       Interventions Encouraged to attend Cardiac Rehabilitation for the exercise       Continue Psychosocial Services  Follow up required by staff                Psychosocial Discharge (Final Psychosocial Re-Evaluation):  Psychosocial Re-Evaluation - 06/25/23 1440       Psychosocial Re-Evaluation   Current issues with History of Depression    Comments Basel reports no major stressors at this tie. He reports  that he continues to take his anti-depressant medication and reports that it has worked well to manage his depression symptoms. He does report having trouble falling asleep but he has medications to help with that. His wife, kids, and dog are a good support sytem for him.    Expected Outcomes Short: Continue to exercise for mental boost. Long: Continue to maintain positive outlook.    Interventions Encouraged to attend Cardiac Rehabilitation for the exercise     Continue Psychosocial Services  Follow up required by staff             Vocational Rehabilitation: Provide vocational rehab assistance to qualifying candidates.   Vocational Rehab Evaluation & Intervention:  Vocational Rehab - 05/24/23 1456       Initial Vocational Rehab Evaluation & Intervention   Assessment shows need for Vocational Rehabilitation No      Vocational Rehab Re-Evaulation   Comments no request for VR             Education: Education Goals: Education classes will be provided on a variety of topics geared toward better understanding of heart health and risk factor modification. Participant will state understanding/return demonstration of topics presented as noted by education test scores.  Learning Barriers/Preferences:   General Cardiac Education Topics:  AED/CPR: - Group verbal and written instruction with the use of models to demonstrate the basic use of the AED with the basic ABC's of resuscitation.   Anatomy and Cardiac Procedures: - Group verbal and visual presentation and models provide information about basic cardiac anatomy and function. Reviews the testing methods done to diagnose heart disease and the outcomes of the test results. Describes the treatment choices: Medical Management, Angioplasty, or Coronary Bypass Surgery for treating various heart conditions including Myocardial Infarction, Angina, Valve Disease, and Cardiac Arrhythmias.  Written material given at graduation. Flowsheet Row Cardiac Rehab from 10/19/2021 in [facility] Cardiac and Pulmonary Rehab  Education need identified 08/08/21  Date 10/05/21  Educator SB  Instruction Review Code 1- Verbalizes Understanding       Medication Safety: - Group verbal and visual instruction to review commonly prescribed medications for heart and lung disease. Reviews the medication, class of the drug, and side effects. Includes the steps to properly store meds and maintain the prescription regimen.   Written material given at graduation. Flowsheet Row Cardiac Rehab from 07/25/2023 in [facility] Cardiac and Pulmonary Rehab  Date 07/04/23  Educator SB  Instruction Review Code 1- Verbalizes Understanding       Intimacy: - Group verbal instruction through game format to discuss how heart and lung disease can affect sexual intimacy. Written material given at graduation.. Flowsheet Row Cardiac Rehab from 10/19/2021 in [facility] Cardiac and Pulmonary Rehab  Date 09/28/21  Educator [facility]  Instruction Review Code 1- Verbalizes Understanding       Know Your Numbers and Heart Failure: - Group verbal and visual instruction to discuss disease risk factors for cardiac and pulmonary disease and treatment options.  Reviews associated critical values for Overweight/Obesity, Hypertension, Cholesterol, and Diabetes.  Discusses basics of heart failure: signs/symptoms and treatments.  Introduces Heart Failure Zone chart for action plan for heart failure.  Written material given at graduation. Flowsheet Row Cardiac Rehab from 07/25/2023 in [facility] Cardiac and Pulmonary Rehab  Date 07/11/23  Educator SB  Instruction Review Code 1- Verbalizes Understanding       Infection Prevention: - Provides verbal and written material to individual with discussion of infection control including proper hand washing and proper equipment  cleaning during exercise session. Flowsheet Row Cardiac Rehab from 07/25/2023 in [facility] Cardiac and Pulmonary Rehab  Date 05/31/23  Educator MB  Instruction Review Code 1- Verbalizes Understanding       Falls Prevention: - Provides verbal and written material to individual with discussion of falls prevention and safety. Flowsheet Row Cardiac Rehab from 07/25/2023 in [facility] Cardiac and Pulmonary Rehab  Date 05/31/23  Educator MB  Instruction Review Code 1- Verbalizes Understanding       Other: -Provides group and verbal instruction on various topics (see comments)   Knowledge Questionnaire  Score:  Knowledge Questionnaire Score - 05/31/23 1628       Knowledge Questionnaire Score   Pre Score 25/26             Core Components/Risk Factors/Patient Goals at Admission:  Personal Goals and Risk Factors at Admission - 05/31/23 1628       Core Components/Risk Factors/Patient Goals on Admission    Weight Management Yes;Weight Loss    Intervention Weight Management: Develop a combined nutrition and exercise program designed to reach desired caloric intake, while maintaining appropriate intake of nutrient and fiber, sodium and fats, and appropriate energy expenditure required for the weight goal.;Weight Management: Provide education and appropriate resources to help participant work on and attain dietary goals.;Weight Management/Obesity: Establish reasonable short term and long term weight goals.    Admit Weight 173 lb 9.6 oz (78.7 kg)    Goal Weight: Short Term 170 lb (77.1 kg)    Goal Weight: Long Term 155 lb (70.3 kg)    Expected Outcomes Short Term: Continue to assess and modify interventions until short term weight is achieved;Long Term: Adherence to nutrition and physical activity/exercise program aimed toward attainment of established weight goal;Weight Loss: Understanding of general recommendations for a balanced deficit meal plan, which promotes 1-2 lb weight loss per week and includes a negative energy balance of [phone] kcal/d;Understanding recommendations for meals to include 15-35% energy as protein, 25-35% energy from fat, 35-60% energy from carbohydrates, less than 200mg  of dietary cholesterol, 20-35 gm of total fiber daily;Understanding of distribution of calorie intake throughout the day with the consumption of 4-5 meals/snacks    Diabetes Yes    Intervention Provide education about signs/symptoms and action to take for hypo/hyperglycemia.;Provide education about proper nutrition, including hydration, and aerobic/resistive exercise prescription along with prescribed  medications to achieve blood glucose in normal ranges: Fasting glucose 65-99 mg/dL    Expected Outcomes Short Term: Participant verbalizes understanding of the signs/symptoms and immediate care of hyper/hypoglycemia, proper foot care and importance of medication, aerobic/resistive exercise and nutrition plan for blood glucose control.;Long Term: Attainment of HbA1C < 7%.    Hypertension Yes    Intervention Provide education on lifestyle modifcations including regular physical activity/exercise, weight management, moderate sodium restriction and increased consumption of fresh fruit, vegetables, and low fat dairy, alcohol moderation, and smoking cessation.;Monitor prescription use compliance.    Expected Outcomes Short Term: Continued assessment and intervention until BP is < 140/33mm HG in hypertensive participants. < 130/57mm HG in hypertensive participants with diabetes, heart failure or chronic kidney disease.;Long Term: Maintenance of blood pressure at goal levels.    Lipids Yes    Intervention Provide education and support for participant on nutrition & aerobic/resistive exercise along with prescribed medications to achieve LDL 70mg , HDL >40mg .    Expected Outcomes Short Term: Participant states understanding of desired cholesterol values and is compliant with medications prescribed. Participant is following exercise prescription and nutrition guidelines.;Long Term: Cholesterol  controlled with medications as prescribed, with individualized exercise RX and with personalized nutrition plan. Value goals: LDL < 70mg , HDL > 40 mg.             Education:Diabetes - Individual verbal and written instruction to review signs/symptoms of diabetes, desired ranges of glucose level fasting, after meals and with exercise. Acknowledge that pre and post exercise glucose checks will be done for 3 sessions at entry of program. Flowsheet Row Cardiac Rehab from 07/25/2023 in [facility] Cardiac and Pulmonary Rehab  Date  05/31/23  Educator MB  Instruction Review Code 1- Verbalizes Understanding       Core Components/Risk Factors/Patient Goals Review:   Goals and Risk Factor Review     Row Name 06/25/23 1443             Core Components/Risk Factors/Patient Goals Review   Personal Goals Review Weight Management/Obesity;Diabetes;Hypertension       Review Lash continues to work towards a weight loss goal of 160 lbs and he weighed in today at 173.7 lbs. He checks his blood sugar everyday and takes his trulicity once a week which keeps his sugars well regulated. He also has his own BP cuff at home which he uses to check his BP regularly. He reports that his BP has stayed within normal ranges.       Expected Outcomes Short: Continue to work towards weight goal through diet and exercise. Long: Continue to manage lifestyle risk factors.                Core Components/Risk Factors/Patient Goals at Discharge (Final Review):   Goals and Risk Factor Review - 06/25/23 1443       Core Components/Risk Factors/Patient Goals Review   Personal Goals Review Weight Management/Obesity;Diabetes;Hypertension    Review Cordarro continues to work towards a weight loss goal of 160 lbs and he weighed in today at 173.7 lbs. He checks his blood sugar everyday and takes his trulicity once a week which keeps his sugars well regulated. He also has his own BP cuff at home which he uses to check his BP regularly. He reports that his BP has stayed within normal ranges.    Expected Outcomes Short: Continue to work towards weight goal through diet and exercise. Long: Continue to manage lifestyle risk factors.             ITP Comments:  ITP Comments     Row Name 05/24/23 1457 05/31/23 1611 06/06/23 1405 06/13/23 1255 07/11/23 0811   ITP Comments Virtual orientation call completed today. he has an appointment on Date: 05/31/2023  for EP eval and gym Orientation.  Documentation of diagnosis can be found in [facility] Date: 05/02/2023 .  Completed and gym orientation. Initial ITP created and sent for review to Dr. Bethann Punches, Medical Director. First full day of exercise!  Patient was oriented to gym and equipment including functions, settings, policies, and procedures.  Patient's individual exercise prescription and treatment plan were reviewed.  All starting workloads were established based on the results of the 6 minute walk test done at initial orientation visit.  The plan for exercise progression was also introduced and progression will be customized based on patient's performance and goals. 30 Day review completed. Medical Director ITP review done, changes made as directed, and signed approval by Medical Director. 30 Day review completed. Medical Director ITP review done, changes made as directed, and signed approval by Medical Director.    Row Name 08/08/23 [phone]  ITP Comments 30 Day review completed. Medical Director ITP review done, changes made as directed, and signed approval by Medical Director.                Comments: 30 Day review completed. Medical Director ITP review done, changes made as directed, and signed approval by Medical Director.

## 2023-08-09 ENCOUNTER — Encounter: Payer: 59 | Admitting: *Deleted

## 2023-08-09 DIAGNOSIS — Z48812 Encounter for surgical aftercare following surgery on the circulatory system: Secondary | ICD-10-CM | POA: Diagnosis not present

## 2023-08-09 DIAGNOSIS — Z955 Presence of coronary angioplasty implant and graft: Secondary | ICD-10-CM

## 2023-08-09 NOTE — Progress Notes (Signed)
 Daily Session Note  Patient Details  Name: Frank Wong MRN: [id number] Date of Birth: Feb 05, 1956 Referring Provider:   Flowsheet Row Cardiac Rehab from 05/31/2023 in [facility] Cardiac and Pulmonary Rehab  Referring Provider [name], MD       Encounter Date: 08/09/2023  Check In:  Session Check In - 08/09/23 1415       Check-In   Supervising physician immediately available to respond to emergencies See telemetry face sheet for immediately available ER MD    Location [facility]-Cardiac & Pulmonary Rehab    Staff Present Susann Givens RN,BSN;Joseph Reino Kent RCP,RRT,BSRT;[name], [place], Exercise Physiologist;[name] BS, Exercise Physiologist    Virtual Visit No    Medication changes reported     No    Fall or balance concerns reported    No    Warm-up and Cool-down Performed on first and last piece of equipment    Resistance Training Performed Yes    VAD Patient? No    PAD/SET Patient? No      Pain Assessment   Currently in Pain? No/denies                Social History   Tobacco Use  Smoking Status Former   Current packs/day: 0.00   Types: Cigarettes   Quit date: 05/29/1993   Years since quitting: 30.2  Smokeless Tobacco Former   Types: Snuff, Chew   Quit date: 05/29/1978    Goals Met:  Independence with exercise equipment Exercise tolerated well No report of concerns or symptoms today Strength training completed today  Goals Unmet:  Not Applicable  Comments: Pt able to follow exercise prescription today without complaint.  Will continue to monitor for progression.    Dr. Bethann Punches is Medical Director for [facility] Cardiac Rehabilitation.  Dr. Vida Rigger is Medical Director for [facility] Pulmonary Rehabilitation.

## 2023-08-13 ENCOUNTER — Encounter: Payer: 59 | Admitting: *Deleted

## 2023-08-13 DIAGNOSIS — Z48812 Encounter for surgical aftercare following surgery on the circulatory system: Secondary | ICD-10-CM | POA: Diagnosis not present

## 2023-08-13 DIAGNOSIS — Z955 Presence of coronary angioplasty implant and graft: Secondary | ICD-10-CM

## 2023-08-13 NOTE — Progress Notes (Signed)
 Daily Session Note  Patient Details  Name: Frank Wong MRN: [id number] Date of Birth: June 25, 1955 Referring Provider:   Flowsheet Row Cardiac Rehab from 05/31/2023 in [facility] Cardiac and Pulmonary Rehab  Referring Provider [name], MD       Encounter Date: 08/13/2023  Check In:  Session Check In - 08/13/23 1350       Check-In   Supervising physician immediately available to respond to emergencies See telemetry face sheet for immediately available ER MD    Location [facility]-Cardiac & Pulmonary Rehab    Staff Present Susann Givens RN,BSN;[name], BS, Exercise Physiologist;Joseph Casimer Lanius, BS, RRT, CPFT    Virtual Visit No    Medication changes reported     No    Fall or balance concerns reported    No    Warm-up and Cool-down Performed on first and last piece of equipment    Resistance Training Performed Yes    VAD Patient? No    PAD/SET Patient? No      Pain Assessment   Currently in Pain? No/denies                Social History   Tobacco Use  Smoking Status Former   Current packs/day: 0.00   Types: Cigarettes   Quit date: 05/29/1993   Years since quitting: 30.2  Smokeless Tobacco Former   Types: Snuff, Chew   Quit date: 05/29/1978    Goals Met:  Independence with exercise equipment Exercise tolerated well No report of concerns or symptoms today Strength training completed today  Goals Unmet:  Not Applicable  Comments: Pt able to follow exercise prescription today without complaint.  Will continue to monitor for progression.    Dr. Bethann Punches is Medical Director for [facility] Cardiac Rehabilitation.  Dr. Vida Rigger is Medical Director for [facility] Pulmonary Rehabilitation.

## 2023-08-16 ENCOUNTER — Encounter: Payer: 59 | Admitting: *Deleted

## 2023-08-16 DIAGNOSIS — Z48812 Encounter for surgical aftercare following surgery on the circulatory system: Secondary | ICD-10-CM | POA: Diagnosis not present

## 2023-08-16 DIAGNOSIS — Z955 Presence of coronary angioplasty implant and graft: Secondary | ICD-10-CM

## 2023-08-16 NOTE — Progress Notes (Signed)
 Daily Session Note  Patient Details  Name: Frank Wong MRN: [id number] Date of Birth: 09-14-1955 Referring Provider:   Flowsheet Row Cardiac Rehab from 05/31/2023 in [facility] Cardiac and Pulmonary Rehab  Referring Provider [name], MD       Encounter Date: 08/16/2023  Check In:  Session Check In - 08/16/23 1354       Check-In   Supervising physician immediately available to respond to emergencies See telemetry face sheet for immediately available ER MD    Location [facility]-Cardiac & Pulmonary Rehab    Staff Present Susann Givens RN,BSN;Joseph [facility] BS, Exercise Physiologist    Virtual Visit No    Medication changes reported     No    Fall or balance concerns reported    No    Warm-up and Cool-down Performed on first and last piece of equipment    Resistance Training Performed Yes    VAD Patient? No    PAD/SET Patient? No      Pain Assessment   Currently in Pain? No/denies                Social History   Tobacco Use  Smoking Status Former   Current packs/day: 0.00   Types: Cigarettes   Quit date: 05/29/1993   Years since quitting: 30.2  Smokeless Tobacco Former   Types: Snuff, Chew   Quit date: 05/29/1978    Goals Met:  Independence with exercise equipment Exercise tolerated well No report of concerns or symptoms today Strength training completed today  Goals Unmet:  Not Applicable  Comments: Pt able to follow exercise prescription today without complaint.  Will continue to monitor for progression.    Dr. Bethann Punches is Medical Director for [facility] Cardiac Rehabilitation.  Dr. Vida Rigger is Medical Director for [facility] Pulmonary Rehabilitation.

## 2023-08-17 ENCOUNTER — Encounter: Admitting: *Deleted

## 2023-08-17 DIAGNOSIS — Z955 Presence of coronary angioplasty implant and graft: Secondary | ICD-10-CM

## 2023-08-17 DIAGNOSIS — Z48812 Encounter for surgical aftercare following surgery on the circulatory system: Secondary | ICD-10-CM | POA: Diagnosis not present

## 2023-08-17 NOTE — Progress Notes (Signed)
 Discharge Note for  Frank Wong     06-23-1955          Frank Wong graduated today from  rehab with 35 sessions completed.  Details of the patient's exercise prescription and what He needs to do in order to continue the prescription and progress were discussed with patient.  Patient was given a copy of prescription and goals.  Patient verbalized understanding. Frank Wong plans to continue to exercise by exercising at home.   6 Minute Walk     Row Name 05/31/23 1613 08/07/23 1114       6 Minute Walk   Phase Initial Discharge    Distance 1705 feet 2080 feet    Distance % Change -- 22 %    Distance Feet Change -- 375 ft    Walk Time 6 minutes 6 minutes    # of Rest Breaks 0 0    MPH 3.23 3.94    METS 3.76 4.61    RPE 8 12    Perceived Dyspnea  1 0    VO2 Peak 13.14 16.15    Symptoms No No    Resting HR 80 bpm 74 bpm    Resting BP 100/60 116/70    Resting Oxygen Saturation  93 % 94 %    Exercise Oxygen Saturation  during 6 min walk 97 % 89 %    Max Ex. HR 108 bpm 105 bpm    Max Ex. BP 100/60 128/66    2 Minute Post BP 98/60 --

## 2023-08-17 NOTE — Progress Notes (Signed)
 Cardiac Individual Treatment Plan  Patient Details  Name: Frank Wong: [id number] Date of Birth: 06-13-1955 Referring Provider:   Flowsheet Row Cardiac Rehab from 05/31/2023 in [facility] Cardiac and Pulmonary Rehab  Referring Provider [name], MD       Initial Encounter Date:  Flowsheet Row Cardiac Rehab from 05/31/2023 in [facility] Cardiac and Pulmonary Rehab  Date 05/31/23       Visit Diagnosis: Status post coronary artery stent placement  Patient's Home Medications on Admission:  Current Outpatient Medications:    acidophilus (RISAQUAD) CAPS capsule, Take 1 capsule by mouth daily., Disp: , Rfl:    amphetamine-dextroamphetamine (ADDERALL XR) 10 MG 24 hr capsule, Take 10 mg by mouth daily., Disp: , Rfl:    aspirin EC 81 MG tablet, Take 81 mg by mouth in the morning., Disp: , Rfl:    atorvastatin (LIPITOR) 80 MG tablet, Take 80 mg by mouth in the morning., Disp: , Rfl:    B Complex-C (B-COMPLEX WITH VITAMIN C) tablet, Take 1 tablet by mouth in the morning. (Patient not taking: Reported on 05/24/2023), Disp: , Rfl:    Cholecalciferol (VITAMIN D-1000 MAX ST) 25 MCG (1000 UT) tablet, Take 1,000 Units by mouth daily. (Patient not taking: Reported on 05/24/2023), Disp: , Rfl:    clonazePAM (KLONOPIN) 2 MG tablet, Take 1 mg by mouth at bedtime as needed (sleep/ anxiety)., Disp: , Rfl:    fexofenadine (ALLEGRA) 180 MG tablet, Take 180 mg by mouth in the morning., Disp: , Rfl:    fluticasone (FLONASE) 50 MCG/ACT nasal spray, Place 1 spray into both nostrils daily for 14 days., Disp: 15 mL, Rfl: 0   Fluticasone-Umeclidin-Vilant 100-62.5-25 MCG/ACT AEPB, Inhale 1 puff into the lungs daily., Disp: , Rfl:    gabapentin (NEURONTIN) 600 MG tablet, Take 600 mg by mouth 2 (two) times daily., Disp: , Rfl:    glipiZIDE (GLUCOTROL XL) 2.5 MG 24 hr tablet, Take 2.5 mg by mouth daily., Disp: , Rfl:    isosorbide mononitrate (IMDUR) 30 MG 24 hr tablet, Take 30 mg by mouth daily., Disp: , Rfl:     JARDIANCE 25 MG TABS tablet, Take 25 mg by mouth in the morning., Disp: , Rfl:    levalbuterol (XOPENEX HFA) 45 MCG/ACT inhaler, Inhale 1-2 puffs into the lungs every 6 (six) hours as needed for wheezing or shortness of breath., Disp: , Rfl:    levocetirizine (XYZAL) 5 MG tablet, Take 5 mg by mouth every evening., Disp: , Rfl:    lisinopril (ZESTRIL) 2.5 MG tablet, Take 2.5 mg by mouth daily., Disp: , Rfl:    magnesium oxide (MAG-OX) 400 MG tablet, Take 400 mg by mouth at bedtime. (Patient not taking: Reported on 05/24/2023), Disp: , Rfl:    metFORMIN (GLUCOPHAGE) 1000 MG tablet, Take 1,000 mg by mouth 2 (two) times daily with a meal. , Disp: , Rfl:    montelukast (SINGULAIR) 10 MG tablet, Take 10 mg by mouth at bedtime., Disp: , Rfl:    Multiple Vitamin (MULTIVITAMIN WITH MINERALS) TABS tablet, Take 1 tablet by mouth in the morning., Disp: , Rfl:    nitroGLYCERIN (NITROSTAT) 0.4 MG SL tablet, Place 0.4 mg under the tongue every 5 (five) minutes as needed for chest pain., Disp: , Rfl:    NON FORMULARY, Pt uses a bi-pap nightly, Disp: , Rfl:    pantoprazole (PROTONIX) 40 MG tablet, Take 40 mg by mouth at bedtime., Disp: , Rfl:    prasugrel (EFFIENT) 10 MG TABS tablet, Take  1 tablet (10 mg total) by mouth daily., Disp: 30 tablet, Rfl: 0   pseudoephedrine (SUDAFED) 60 MG tablet, Take 1 tablet (60 mg total) by mouth every 6 (six) hours as needed., Disp: 30 tablet, Rfl: 0   sertraline (ZOLOFT) 25 MG tablet, Take 25 mg by mouth daily., Disp: , Rfl:    Testosterone 1.62 % GEL, APPLY TWO (2) PUMPS TOPICALLY IN THE MORNING TO THE SHOULDERS AS DIRECTED (Patient not taking: Reported on 04/25/2023), Disp: 75 g, Rfl: 2   traZODone (DESYREL) 50 MG tablet, Take 50 mg by mouth at bedtime., Disp: , Rfl:   Past Medical History: Past Medical History:  Diagnosis Date   Alcoholism (HCC)    Asthma    CAD (coronary artery disease)    Cardiac arrest (HCC)    Chronic cardiac arrhythmia    Chronic kidney disease     COPD (chronic obstructive pulmonary disease) (HCC)    Depression    Diabetes mellitus without complication (HCC)    Essential tremor    GERD (gastroesophageal reflux disease)    Heart attack (HCC)    Heart murmur    Hyperlipidemia    Hypertension    IBS (irritable bowel syndrome)    Shoulder problem 2010   right   Sinoatrial node dysfunction (HCC)    Sleep apnea     Tobacco Use: Social History   Tobacco Use  Smoking Status Former   Current packs/day: 0.00   Types: Cigarettes   Quit date: 05/29/1993   Years since quitting: 30.2  Smokeless Tobacco Former   Types: Snuff, Chew   Quit date: 05/29/1978    Labs: Review Flowsheet        No data to display           Exercise Target Goals: Exercise Program Goal: Individual exercise prescription set using results from initial 6 min walk test and THRR while considering  patient's activity barriers and safety.   Exercise Prescription Goal: Initial exercise prescription builds to 30-45 minutes a day of aerobic activity, 2-3 days per week.  Home exercise guidelines will be given to patient during program as part of exercise prescription that the participant will acknowledge.   Education: Aerobic Exercise: - Group verbal and visual presentation on the components of exercise prescription. Introduces F.I.T.T principle from ACSM for exercise prescriptions.  Reviews F.I.T.T. principles of aerobic exercise including progression. Written material given at graduation. Flowsheet Row Cardiac Rehab from 10/19/2021 in [facility] Cardiac and Pulmonary Rehab  Date 09/28/21  Educator [facility]  Instruction Review Code 1- Verbalizes Understanding       Education: Resistance Exercise: - Group verbal and visual presentation on the components of exercise prescription. Introduces F.I.T.T principle from ACSM for exercise prescriptions  Reviews F.I.T.T. principles of resistance exercise including progression. Written material given at graduation. Flowsheet Row  Cardiac Rehab from 08/08/2023 in [facility] Cardiac and Pulmonary Rehab  Date 08/08/23  Educator [facility]  Instruction Review Code 1- [employer] Understanding        Education: Exercise & Equipment Safety: - Individual verbal instruction and demonstration of equipment use and safety with use of the equipment. Flowsheet Row Cardiac Rehab from 08/08/2023 in [facility] Cardiac and Pulmonary Rehab  Date 05/31/23  Educator [facility]  Instruction Review Code 1- Verbalizes Understanding       Education: Exercise Physiology & General Exercise Guidelines: - Group verbal and written instruction with models to review the exercise physiology of the cardiovascular system and associated critical values. Provides general exercise guidelines with specific  guidelines to those with heart or lung disease.  Flowsheet Row Cardiac Rehab from 10/19/2021 in [facility] Cardiac and Pulmonary Rehab  Date 09/21/21  Educator [facility]  Instruction Review Code 1- Verbalizes Understanding       Education: Flexibility, Balance, Mind/Body Relaxation: - Group verbal and visual presentation with interactive activity on the components of exercise prescription. Introduces F.I.T.T principle from ACSM for exercise prescriptions. Reviews F.I.T.T. principles of flexibility and balance exercise training including progression. Also discusses the mind body connection.  Reviews various relaxation techniques to help reduce and manage stress (i.e. Deep breathing, progressive muscle relaxation, and visualization). Balance handout provided to take home. Written material given at graduation. Flowsheet Row Cardiac Rehab from 08/08/2023 in [facility] Cardiac and Pulmonary Rehab  Date 08/08/23  Educator [facility]  Instruction Review Code 1- Verbalizes Understanding       Activity Barriers & Risk Stratification:  Activity Barriers & Cardiac Risk Stratification - 05/31/23 1614       Activity Barriers & Cardiac Risk Stratification   Activity Barriers Shortness of Breath;Joint  Problems   has COPD; joint issues: bilateral shoulders, bilateral hips, bilateral knees; L shoulder surgery scheduled   Cardiac Risk Stratification Moderate             6 Minute Walk:  6 Minute Walk     Row Name 05/31/23 1613 08/07/23 1114       6 Minute Walk   Phase Initial Discharge    Distance 1705 feet 2080 feet    Distance % Change -- 22 %    Distance Feet Change -- 375 ft    Walk Time 6 minutes 6 minutes    # of Rest Breaks 0 0    MPH 3.23 3.94    METS 3.76 4.61    RPE 8 12    Perceived Dyspnea  1 0    VO2 Peak 13.14 16.15    Symptoms No No    Resting HR 80 bpm 74 bpm    Resting BP 100/60 116/70    Resting Oxygen Saturation  93 % 94 %    Exercise Oxygen Saturation  during 6 min walk 97 % 89 %    Max Ex. HR 108 bpm 105 bpm    Max Ex. BP 100/60 128/66    2 Minute Post BP 98/60 --             Oxygen Initial Assessment:   Oxygen Re-Evaluation:   Oxygen Discharge (Final Oxygen Re-Evaluation):   Initial Exercise Prescription:  Initial Exercise Prescription - 05/31/23 1600       Date of Initial Exercise RX and Referring Provider   Date 05/31/23    Referring Provider [name], MD      Oxygen   Maintain Oxygen Saturation 88% or higher      Treadmill   MPH 3.3    Grade 0.5    Minutes 15    METs 3.75      Elliptical   Level 3    Speed 3    Minutes 15    METs 3.76      REL-XR   Level 3    Speed 50    Minutes 15    METs 3.76      Prescription Details   Frequency (times per week) 3    Duration Progress to 30 minutes of continuous aerobic without signs/symptoms of physical distress      Intensity   THRR 40-80% of Max Heartrate 110-140    Ratings  of Perceived Exertion 11-13    Perceived Dyspnea 0-4      Progression   Progression Continue to progress workloads to maintain intensity without signs/symptoms of physical distress.      Resistance Training   Training Prescription Yes    Weight 7 lb    Reps 10-15              Perform Capillary Blood Glucose checks as needed.  Exercise Prescription Changes:   Exercise Prescription Changes     Row Name 05/31/23 1600 06/21/23 1100 07/05/23 0800 07/18/23 1100 08/02/23 0800     Response to Exercise   Blood Pressure (Admit) 100/60 110/60 102/58 110/64 116/58   Blood Pressure (Exercise) 100/60 160/62 152/62 -- --   Blood Pressure (Exit) 98/60 90/56 104/64 102/56 114/62   Heart Rate (Admit) 80 bpm 73 bpm 58 bpm 71 bpm 75 bpm   Heart Rate (Exercise) 108 bpm 138 bpm 155 bpm 140 bpm 133 bpm   Heart Rate (Exit) 86 bpm 101 bpm 108 bpm 115 bpm 107 bpm   Oxygen Saturation (Admit) 93 % -- -- -- --   Oxygen Saturation (Exercise) 97 % -- -- -- --   Oxygen Saturation (Exit) 96 % -- -- -- --   Rating of Perceived Exertion (Exercise) 8 15 16 15 15    Perceived Dyspnea (Exercise) 1 -- -- -- --   Symptoms none none none none none   Comments results First two weeks of exercise -- -- --   Duration Progress to 30 minutes of  aerobic without signs/symptoms of physical distress Continue with 30 min of aerobic exercise without signs/symptoms of physical distress. Continue with 30 min of aerobic exercise without signs/symptoms of physical distress. Continue with 30 min of aerobic exercise without signs/symptoms of physical distress. Continue with 30 min of aerobic exercise without signs/symptoms of physical distress.   Intensity THRR New THRR unchanged THRR unchanged THRR unchanged THRR unchanged     Progression   Progression Continue to progress workloads to maintain intensity without signs/symptoms of physical distress. Continue to progress workloads to maintain intensity without signs/symptoms of physical distress. Continue to progress workloads to maintain intensity without signs/symptoms of physical distress. Continue to progress workloads to maintain intensity without signs/symptoms of physical distress. Continue to progress workloads to maintain intensity without  signs/symptoms of physical distress.   Average METs 3.76 7.68 8.37 8.01 6.75     Resistance Training   Training Prescription -- Yes Yes Yes Yes   Weight -- 7 lb 8 lb 10 lb 10 lb   Reps -- 10-15 10-15 10-15 10-15     Interval Training   Interval Training -- No No Yes No   Equipment -- -- -- Treadmill --   Comments -- -- -- intervals of incline 8-11% --     Treadmill   MPH -- 4 4 4  4.1   Grade -- 11 12 11 11    Minutes -- 15 15 15 15    METs -- 10.13 10.68 10.13 10.36     Elliptical   Level -- 3 9 9 2    Speed -- 6 3.8 3.8 5   Minutes -- 15 15 15 15    METs -- -- 4.1 4.1 4.1     REL-XR   Level -- 5 8 8  --   Minutes -- 15 15 15  --   METs -- 7.5 7.5 7.5 --     Oxygen   Maintain Oxygen Saturation -- 88% or higher 88% or higher  88% or higher 88% or higher    Row Name 08/16/23 1800             Response to Exercise   Blood Pressure (Admit) 108/56       Blood Pressure (Exit) 102/62       Heart Rate (Admit) 78 bpm       Heart Rate (Exercise) 127 bpm       Heart Rate (Exit) 107 bpm       Oxygen Saturation (Admit) 94 %       Oxygen Saturation (Exercise) 89 %       Oxygen Saturation (Exit) 95 %       Rating of Perceived Exertion (Exercise) 15       Symptoms none       Duration Continue with 30 min of aerobic exercise without signs/symptoms of physical distress.       Intensity THRR unchanged         Progression   Progression Continue to progress workloads to maintain intensity without signs/symptoms of physical distress.       Average METs 8.76         Resistance Training   Training Prescription Yes       Weight 10 lb       Reps 10-15         Treadmill   MPH 4.1       Grade 12       Minutes 15       METs 10.92         Elliptical   Level 2       Speed 5       Minutes 15       METs 3.1         REL-XR   Level 8       Minutes 15         Oxygen   Maintain Oxygen Saturation 88% or higher                Exercise Comments:   Exercise Comments     Row  Name 06/06/23 1405 08/17/23 1227         Exercise Comments First full day of exercise!  Patient was oriented to gym and equipment including functions, settings, policies, and procedures.  Patient's individual exercise prescription and treatment plan were reviewed.  All starting workloads were established based on the results of the 6 minute walk test done at initial orientation visit.  The plan for exercise progression was also introduced and progression will be customized based on patient's performance and goals. Drexler graduated today from  rehab with 35 sessions completed.  Details of the patient's exercise prescription and what He needs to do in order to continue the prescription and progress were discussed with patient.  Patient was given a copy of prescription and goals.  Patient verbalized understanding. Tatsuo plans to continue to exercise by exercising at home.               Exercise Goals and Review:   Exercise Goals     Row Name 05/31/23 1622             Exercise Goals   Increase Physical Activity Yes       Intervention Provide advice, education, support and counseling about physical activity/exercise needs.;Develop an individualized exercise prescription for aerobic and resistive training based on initial evaluation findings, risk stratification, comorbidities and participant's personal goals.  Expected Outcomes Long Term: Add in home exercise to make exercise part of routine and to increase amount of physical activity.;Short Term: Attend rehab on a regular basis to increase amount of physical activity.;Long Term: Exercising regularly at least 3-5 days a week.       Increase Strength and Stamina Yes       Intervention Provide advice, education, support and counseling about physical activity/exercise needs.;Develop an individualized exercise prescription for aerobic and resistive training based on initial evaluation findings, risk stratification, comorbidities and  participant's personal goals.       Expected Outcomes Short Term: Increase workloads from initial exercise prescription for resistance, speed, and METs.;Short Term: Perform resistance training exercises routinely during rehab and add in resistance training at home;Long Term: Improve cardiorespiratory fitness, muscular endurance and strength as measured by increased METs and functional capacity ( )       Able to understand and use rate of perceived exertion (RPE) scale Yes       Intervention Provide education and explanation on how to use RPE scale       Expected Outcomes Long Term:  Able to use RPE to guide intensity level when exercising independently;Short Term: Able to use RPE daily in rehab to express subjective intensity level       Able to understand and use Dyspnea scale Yes       Intervention Provide education and explanation on how to use Dyspnea scale       Expected Outcomes Short Term: Able to use Dyspnea scale daily in rehab to express subjective sense of shortness of breath during exertion;Long Term: Able to use Dyspnea scale to guide intensity level when exercising independently       Knowledge and understanding of Target Heart Rate Range (THRR) Yes       Intervention Provide education and explanation of THRR including how the numbers were predicted and where they are located for reference       Expected Outcomes Short Term: Able to state/look up THRR;Long Term: Able to use THRR to govern intensity when exercising independently;Short Term: Able to use daily as guideline for intensity in rehab       Able to check pulse independently Yes       Intervention Provide education and demonstration on how to check pulse in carotid and radial arteries.;Review the importance of being able to check your own pulse for safety during independent exercise       Expected Outcomes Short Term: Able to explain why pulse checking is important during independent exercise;Long Term: Able to check pulse  independently and accurately       Understanding of Exercise Prescription Yes       Intervention Provide education, explanation, and written materials on patient's individual exercise prescription       Expected Outcomes Short Term: Able to explain program exercise prescription;Long Term: Able to explain home exercise prescription to exercise independently                Exercise Goals Re-Evaluation :  Exercise Goals Re-Evaluation     Row Name 06/06/23 1406 06/21/23 1128 07/05/23 0808 07/18/23 1126 08/02/23 0836     Exercise Goal Re-Evaluation   Exercise Goals Review Able to understand and use rate of perceived exertion (RPE) scale;Increase Physical Activity;Knowledge and understanding of Target Heart Rate Range (THRR);Understanding of Exercise Prescription;Increase Strength and Stamina;Able to check pulse independently Increase Physical Activity;Understanding of Exercise Prescription;Increase Strength and Stamina Increase Physical Activity;Understanding of Exercise Prescription;Increase Strength and Stamina  Increase Physical Activity;Understanding of Exercise Prescription;Increase Strength and Stamina Increase Physical Activity;Understanding of Exercise Prescription;Increase Strength and Stamina   Comments Reviewed RPE and dyspnea scale, THR and program prescription with pt today.  Pt voiced understanding and was given a copy of goals to take home. Emrah is off to a good start in the program. He has done well on the treadmill and has increased is workload up to a speed of 4 mph with an 11% incline. He also has done well at level 5 on the XR and level 3 on the elliptical. We will continue to monitor his progress in the program. Gaberiel continues to do well in rehab. He recently increased his incline on the treadmill to 12% while maintaining a speed of 4 mph. He also improved to level 9 on the elliptical and level 8 on the XR, and increased to 8 lb hand weights for resistance training. We will  continue to monitor his progress in the program. Ahmaud is doing well in rehab. He has begun doing intervals of incline on the tradmill at 8-11% at a speed of 4 mph. He also continues to work at level 9 on the elliptical and level 8 on the XR. We will continue to monitor his progress in the program. Garison is doing well in rehab. He recently increased his treadmill workload to a speed of 4.1 mph and an incline of 11%. He also increased his speed on the elliptical to 5 mph at level 2. We will continue to monitor his progress in the program.   Expected Outcomes Short: Use RPE daily to regulate intensity.  Long: Follow program prescription in THR. Short: Continue to progressively increase workloads. Long: Continue exercise to improve strength and stamina. Short: Continue to progressively increase workloads. Long: Continue exercise to improve strength and stamina. Short: Continue to progressively increase workloads. Long: Continue exercise to improve strength and stamina. Short: Continue to progressively increase workloads. Long: Continue exercise to improve strength and stamina.    Row Name 08/16/23 1815             Exercise Goal Re-Evaluation   Exercise Goals Review Increase Physical Activity;Understanding of Exercise Prescription;Increase Strength and Stamina       Comments Basir is doing well in the program. he recently completed his post-6MWT and was able to improve by 22%. He was also able to increase his treadmill incline to 12%. We will continue to monitor his progress in the program.       Expected Outcomes Short: Continue to progressively increase workloads. Long: Continue exercise to improve strength and stamina.                Discharge Exercise Prescription (Final Exercise Prescription Changes):  Exercise Prescription Changes - 08/16/23 1800       Response to Exercise   Blood Pressure (Admit) 108/56    Blood Pressure (Exit) 102/62    Heart Rate (Admit) 78 bpm    Heart Rate  (Exercise) 127 bpm    Heart Rate (Exit) 107 bpm    Oxygen Saturation (Admit) 94 %    Oxygen Saturation (Exercise) 89 %    Oxygen Saturation (Exit) 95 %    Rating of Perceived Exertion (Exercise) 15    Symptoms none    Duration Continue with 30 min of aerobic exercise without signs/symptoms of physical distress.    Intensity THRR unchanged      Progression   Progression Continue to progress workloads to maintain intensity without signs/symptoms of physical  distress.    Average METs 8.76      Resistance Training   Training Prescription Yes    Weight 10 lb    Reps 10-15      Treadmill   MPH 4.1    Grade 12    Minutes 15    METs 10.92      Elliptical   Level 2    Speed 5    Minutes 15    METs 3.1      REL-XR   Level 8    Minutes 15      Oxygen   Maintain Oxygen Saturation 88% or higher             Nutrition:  Target Goals: Understanding of nutrition guidelines, daily intake of sodium 1500mg , cholesterol 200mg , calories 30% from fat and 7% or less from saturated fats, daily to have 5 or more servings of fruits and vegetables.  Education: All About Nutrition: -Group instruction provided by verbal, written material, interactive activities, discussions, models, and posters to present general guidelines for heart healthy nutrition including fat, fiber, MyPlate, the role of sodium in heart healthy nutrition, utilization of the nutrition label, and utilization of this knowledge for meal planning. Follow up email sent as well. Written material given at graduation. Flowsheet Row Cardiac Rehab from 08/08/2023 in [facility] Cardiac and Pulmonary Rehab  Education need identified 05/31/23  Date 06/13/23  [part 2 06/20/23]  Educator Ottis Stain  Instruction Review Code 1- Verbalizes Understanding       Biometrics:  Pre Biometrics - 05/31/23 1623       Pre Biometrics   Height 5' 7.5" (1.715 m)    Weight 173 lb 9.6 oz (78.7 kg)    Waist Circumference 39 inches    Hip Circumference 36.5  inches    Waist to Hip Ratio 1.07 %    BMI (Calculated) 26.77    Single Leg Stand 19.3 seconds             Post Biometrics - 08/07/23 1117        Post  Biometrics   Height 5' 7.5" (1.715 m)    Weight 172 lb 9.6 oz (78.3 kg)    Waist Circumference 37 inches    Hip Circumference 36 inches    Waist to Hip Ratio 1.03 %    BMI (Calculated) 26.62    Single Leg Stand 27.5 seconds             Nutrition Therapy Plan and Nutrition Goals:  Nutrition Therapy & Goals - 05/31/23 1625       Nutrition Therapy   RD appointment deferred Yes      Personal Nutrition Goals   Nutrition Goal RD appointment deferred      Intervention Plan   Intervention Prescribe, educate and counsel regarding individualized specific dietary modifications aiming towards targeted core components such as weight, hypertension, lipid management, diabetes, heart failure and other comorbidities.    Expected Outcomes Short Term Goal: Understand basic principles of dietary content, such as calories, fat, sodium, cholesterol and nutrients.             Nutrition Assessments:  MEDIFICTS Score Key: >=70 Need to make dietary changes  40-70 Heart Healthy Diet <= 40 Therapeutic Level Cholesterol Diet  Flowsheet Row Cardiac Rehab from 05/31/2023 in [facility] Cardiac and Pulmonary Rehab  Picture Your Plate Total Score on Admission 67      Picture Your Plate Scores: <04 Unhealthy dietary pattern with much room for improvement. 41-50  Dietary pattern unlikely to meet recommendations for good health and room for improvement. 51-60 More healthful dietary pattern, with some room for improvement.  >60 Healthy dietary pattern, although there may be some specific behaviors that could be improved.    Nutrition Goals Re-Evaluation:  Nutrition Goals Re-Evaluation     Row Name 06/25/23 1440             Goals   Nutrition Goal RD appointment deferred                Nutrition Goals Discharge (Final Nutrition  Goals Re-Evaluation):  Nutrition Goals Re-Evaluation - 06/25/23 1440       Goals   Nutrition Goal RD appointment deferred             Psychosocial: Target Goals: Acknowledge presence or absence of significant depression and/or stress, maximize coping skills, provide positive support system. Participant is able to verbalize types and ability to use techniques and skills needed for reducing stress and depression.   Education: Stress, Anxiety, and Depression - Group verbal and visual presentation to define topics covered.  Reviews how body is impacted by stress, anxiety, and depression.  Also discusses healthy ways to reduce stress and to treat/manage anxiety and depression.  Written material given at graduation. Flowsheet Row Cardiac Rehab from 08/08/2023 in [facility] Cardiac and Pulmonary Rehab  Date 07/25/23  Educator [facility]  Instruction Review Code 1- [employer] Understanding       Education: Sleep Hygiene -Provides group verbal and written instruction about how sleep can affect your health.  Define sleep hygiene, discuss sleep cycles and impact of sleep habits. Review good sleep hygiene tips.    Initial Review & Psychosocial Screening:  Initial Psych Review & Screening - 05/24/23 1444       Initial Review   Current issues with Current Depression;Current Psychotropic Meds      Family Dynamics   Good Support System? Yes   wife and children     Barriers   Psychosocial barriers to participate in program There are no identifiable barriers or psychosocial needs.      Screening Interventions   Interventions Encouraged to exercise;To provide support and resources with identified psychosocial needs;Provide feedback about the scores to participant    Expected Outcomes Short Term goal: Utilizing psychosocial counselor, staff and physician to assist with identification of specific Stressors or current issues interfering with healing process. Setting desired goal for each stressor or current  issue identified.;Long Term Goal: Stressors or current issues are controlled or eliminated.;Short Term goal: Identification and review with participant of any Quality of Life or Depression concerns found by scoring the questionnaire.;Long Term goal: The participant improves quality of Life and PHQ9 Scores as seen by post scores and/or verbalization of changes             Quality of Life Scores:   Quality of Life - 05/31/23 1624       Quality of Life   Select Quality of Life      Quality of Life Scores   Health/Function Pre 25.8 %    Socioeconomic Pre 27.38 %    Psych/Spiritual Pre 28.43 %    Family Pre 27.6 %    GLOBAL Pre 26.94 %            Scores of 19 and below usually indicate a poorer quality of life in these areas.  A difference of  2-3 points is a clinically meaningful difference.  A difference of 2-3 points in the  total score of the Quality of Life Index has been associated with significant improvement in overall quality of life, self-image, physical symptoms, and general health in studies assessing change in quality of life.  PHQ-9: Review Flowsheet  More data may exist      05/31/2023 10/21/2021 08/08/2021 01/05/2021 11/01/2020  Depression screen PHQ 2/9  Decreased Interest 1 1 0 0 0  Down, Depressed, Hopeless 0 1 0 1 0  PHQ - 2 Score 1 2 0 1 0  Altered sleeping 2 1 0 1 1  Tired, decreased energy 1 1 1 1 1   Change in appetite 1 1 1  0 0  Feeling bad or failure about yourself  0 0 0 0 0  Trouble concentrating 2 2 2 1 1   Moving slowly or fidgety/restless 1 2 0 0 0  Suicidal thoughts 0 0 0 0 0  PHQ-9 Score 8 9 4 4 3   Difficult doing work/chores Not difficult at all Not difficult at all Somewhat difficult Not difficult at all Somewhat difficult   Interpretation of Total Score  Total Score Depression Severity:  1-4 = Minimal depression, 5-9 = Mild depression, 10-14 = Moderate depression, 15-19 = Moderately severe depression, 20-27 = Severe depression   Psychosocial  Evaluation and Intervention:  Psychosocial Evaluation - 05/24/23 1454       Psychosocial Evaluation & Interventions   Comments Tarvaris is returning to the program after cardiac stent placement. He is ready to complete the program without any barriers. HIs support system is his wife and children.  He is on Zoloft for depression and the meds help control his symptoms.  He is ready to get started.    Expected Outcomes Short: attend cardiac rehab for education and exercise. Long: develop and maintain positive self care habits.    Continue Psychosocial Services  Follow up required by staff             Psychosocial Re-Evaluation:  Psychosocial Re-Evaluation     Row Name 06/25/23 1440             Psychosocial Re-Evaluation   Current issues with History of Depression       Comments Azlaan reports no major stressors at this tie. He reports that he continues to take his anti-depressant medication and reports that it has worked well to manage his depression symptoms. He does report having trouble falling asleep but he has medications to help with that. His wife, kids, and dog are a good support sytem for him.       Expected Outcomes Short: Continue to exercise for mental boost. Long: Continue to maintain positive outlook.       Interventions Encouraged to attend Cardiac Rehabilitation for the exercise       Continue Psychosocial Services  Follow up required by staff                Psychosocial Discharge (Final Psychosocial Re-Evaluation):  Psychosocial Re-Evaluation - 06/25/23 1440       Psychosocial Re-Evaluation   Current issues with History of Depression    Comments Ronda reports no major stressors at this tie. He reports that he continues to take his anti-depressant medication and reports that it has worked well to manage his depression symptoms. He does report having trouble falling asleep but he has medications to help with that. His wife, kids, and dog are a good support sytem for  him.    Expected Outcomes Short: Continue to exercise for mental boost. Long: Continue to maintain  positive outlook.    Interventions Encouraged to attend Cardiac Rehabilitation for the exercise    Continue Psychosocial Services  Follow up required by staff             Vocational Rehabilitation: Provide vocational rehab assistance to qualifying candidates.   Vocational Rehab Evaluation & Intervention:  Vocational Rehab - 05/24/23 1456       Initial Vocational Rehab Evaluation & Intervention   Assessment shows need for Vocational Rehabilitation No      Vocational Rehab Re-Evaulation   Comments no request for VR             Education: Education Goals: Education classes will be provided on a variety of topics geared toward better understanding of heart health and risk factor modification. Participant will state understanding/return demonstration of topics presented as noted by education test scores.  Learning Barriers/Preferences:   General Cardiac Education Topics:  AED/CPR: - Group verbal and written instruction with the use of models to demonstrate the basic use of the AED with the basic ABC's of resuscitation.   Anatomy and Cardiac Procedures: - Group verbal and visual presentation and models provide information about basic cardiac anatomy and function. Reviews the testing methods done to diagnose heart disease and the outcomes of the test results. Describes the treatment choices: Medical Management, Angioplasty, or Coronary Bypass Surgery for treating various heart conditions including Myocardial Infarction, Angina, Valve Disease, and Cardiac Arrhythmias.  Written material given at graduation. Flowsheet Row Cardiac Rehab from 10/19/2021 in [facility] Cardiac and Pulmonary Rehab  Education need identified 08/08/21  Date 10/05/21  Educator SB  Instruction Review Code 1- Verbalizes Understanding       Medication Safety: - Group verbal and visual instruction to review  commonly prescribed medications for heart and lung disease. Reviews the medication, class of the drug, and side effects. Includes the steps to properly store meds and maintain the prescription regimen.  Written material given at graduation. Flowsheet Row Cardiac Rehab from 08/08/2023 in [facility] Cardiac and Pulmonary Rehab  Date 07/04/23  Educator SB  Instruction Review Code 1- Verbalizes Understanding       Intimacy: - Group verbal instruction through game format to discuss how heart and lung disease can affect sexual intimacy. Written material given at graduation.. Flowsheet Row Cardiac Rehab from 10/19/2021 in [facility] Cardiac and Pulmonary Rehab  Date 09/28/21  Educator [facility]  Instruction Review Code 1- Verbalizes Understanding       Know Your Numbers and Heart Failure: - Group verbal and visual instruction to discuss disease risk factors for cardiac and pulmonary disease and treatment options.  Reviews associated critical values for Overweight/Obesity, Hypertension, Cholesterol, and Diabetes.  Discusses basics of heart failure: signs/symptoms and treatments.  Introduces Heart Failure Zone chart for action plan for heart failure.  Written material given at graduation. Flowsheet Row Cardiac Rehab from 08/08/2023 in [facility] Cardiac and Pulmonary Rehab  Date 07/11/23  Educator SB  Instruction Review Code 1- Verbalizes Understanding       Infection Prevention: - Provides verbal and written material to individual with discussion of infection control including proper hand washing and proper equipment cleaning during exercise session. Flowsheet Row Cardiac Rehab from 08/08/2023 in [facility] Cardiac and Pulmonary Rehab  Date 05/31/23  Educator MB  Instruction Review Code 1- Verbalizes Understanding       Falls Prevention: - Provides verbal and written material to individual with discussion of falls prevention and safety. Flowsheet Row Cardiac Rehab from 08/08/2023 in [facility] Cardiac and Pulmonary Rehab  Date 05/31/23  Educator MB  Instruction Review Code 1- Verbalizes Understanding       Other: -Provides group and verbal instruction on various topics (see comments)   Knowledge Questionnaire Score:  Knowledge Questionnaire Score - 05/31/23 1628       Knowledge Questionnaire Score   Pre Score 25/26             Core Components/Risk Factors/Patient Goals at Admission:  Personal Goals and Risk Factors at Admission - 05/31/23 1628       Core Components/Risk Factors/Patient Goals on Admission    Weight Management Yes;Weight Loss    Intervention Weight Management: Develop a combined nutrition and exercise program designed to reach desired caloric intake, while maintaining appropriate intake of nutrient and fiber, sodium and fats, and appropriate energy expenditure required for the weight goal.;Weight Management: Provide education and appropriate resources to help participant work on and attain dietary goals.;Weight Management/Obesity: Establish reasonable short term and long term weight goals.    Admit Weight 173 lb 9.6 oz (78.7 kg)    Goal Weight: Short Term 170 lb (77.1 kg)    Goal Weight: Long Term 155 lb (70.3 kg)    Expected Outcomes Short Term: Continue to assess and modify interventions until short term weight is achieved;Long Term: Adherence to nutrition and physical activity/exercise program aimed toward attainment of established weight goal;Weight Loss: Understanding of general recommendations for a balanced deficit meal plan, which promotes 1-2 lb weight loss per week and includes a negative energy balance of [phone] kcal/d;Understanding recommendations for meals to include 15-35% energy as protein, 25-35% energy from fat, 35-60% energy from carbohydrates, less than 200mg  of dietary cholesterol, 20-35 gm of total fiber daily;Understanding of distribution of calorie intake throughout the day with the consumption of 4-5 meals/snacks    Diabetes Yes    Intervention Provide  education about signs/symptoms and action to take for hypo/hyperglycemia.;Provide education about proper nutrition, including hydration, and aerobic/resistive exercise prescription along with prescribed medications to achieve blood glucose in normal ranges: Fasting glucose 65-99 mg/dL    Expected Outcomes Short Term: Participant verbalizes understanding of the signs/symptoms and immediate care of hyper/hypoglycemia, proper foot care and importance of medication, aerobic/resistive exercise and nutrition plan for blood glucose control.;Long Term: Attainment of HbA1C < 7%.    Hypertension Yes    Intervention Provide education on lifestyle modifcations including regular physical activity/exercise, weight management, moderate sodium restriction and increased consumption of fresh fruit, vegetables, and low fat dairy, alcohol moderation, and smoking cessation.;Monitor prescription use compliance.    Expected Outcomes Short Term: Continued assessment and intervention until BP is < 140/19mm HG in hypertensive participants. < 130/61mm HG in hypertensive participants with diabetes, heart failure or chronic kidney disease.;Long Term: Maintenance of blood pressure at goal levels.    Lipids Yes    Intervention Provide education and support for participant on nutrition & aerobic/resistive exercise along with prescribed medications to achieve LDL 70mg , HDL >40mg .    Expected Outcomes Short Term: Participant states understanding of desired cholesterol values and is compliant with medications prescribed. Participant is following exercise prescription and nutrition guidelines.;Long Term: Cholesterol controlled with medications as prescribed, with individualized exercise RX and with personalized nutrition plan. Value goals: LDL < 70mg , HDL > 40 mg.             Education:Diabetes - Individual verbal and written instruction to review signs/symptoms of diabetes, desired ranges of glucose level fasting, after meals and  with exercise. Acknowledge that pre and post exercise glucose  checks will be done for 3 sessions at entry of program. Flowsheet Row Cardiac Rehab from 08/08/2023 in [facility] Cardiac and Pulmonary Rehab  Date 05/31/23  Educator MB  Instruction Review Code 1- Verbalizes Understanding       Core Components/Risk Factors/Patient Goals Review:   Goals and Risk Factor Review     Row Name 06/25/23 1443             Core Components/Risk Factors/Patient Goals Review   Personal Goals Review Weight Management/Obesity;Diabetes;Hypertension       Review Jerime continues to work towards a weight loss goal of 160 lbs and he weighed in today at 173.7 lbs. He checks his blood sugar everyday and takes his trulicity once a week which keeps his sugars well regulated. He also has his own BP cuff at home which he uses to check his BP regularly. He reports that his BP has stayed within normal ranges.       Expected Outcomes Short: Continue to work towards weight goal through diet and exercise. Long: Continue to manage lifestyle risk factors.                Core Components/Risk Factors/Patient Goals at Discharge (Final Review):   Goals and Risk Factor Review - 06/25/23 1443       Core Components/Risk Factors/Patient Goals Review   Personal Goals Review Weight Management/Obesity;Diabetes;Hypertension    Review Crispin continues to work towards a weight loss goal of 160 lbs and he weighed in today at 173.7 lbs. He checks his blood sugar everyday and takes his trulicity once a week which keeps his sugars well regulated. He also has his own BP cuff at home which he uses to check his BP regularly. He reports that his BP has stayed within normal ranges.    Expected Outcomes Short: Continue to work towards weight goal through diet and exercise. Long: Continue to manage lifestyle risk factors.             ITP Comments:  ITP Comments     Row Name 05/24/23 1457 05/31/23 1611 06/06/23 1405 06/13/23 1255 07/11/23  0811   ITP Comments Virtual orientation call completed today. he has an appointment on Date: 05/31/2023  for EP eval and gym Orientation.  Documentation of diagnosis can be found in [facility] Date: 05/02/2023 . Completed and gym orientation. Initial ITP created and sent for review to Dr. Bethann Punches, Medical Director. First full day of exercise!  Patient was oriented to gym and equipment including functions, settings, policies, and procedures.  Patient's individual exercise prescription and treatment plan were reviewed.  All starting workloads were established based on the results of the 6 minute walk test done at initial orientation visit.  The plan for exercise progression was also introduced and progression will be customized based on patient's performance and goals. 30 Day review completed. Medical Director ITP review done, changes made as directed, and signed approval by Medical Director. 30 Day review completed. Medical Director ITP review done, changes made as directed, and signed approval by Medical Director.    Row Name 08/08/23 0815 08/17/23 1227         ITP Comments 30 Day review completed. Medical Director ITP review done, changes made as directed, and signed approval by Medical Director. Darral graduated today from  rehab with 35 sessions completed.  Details of the patient's exercise prescription and what He needs to do in order to continue the prescription and progress were discussed with patient.  Patient was  given a copy of prescription and goals.  Patient verbalized understanding. Yandel plans to continue to exercise by exercising at home.               Comments: Discharge ITP

## 2023-08-17 NOTE — Progress Notes (Signed)
 Daily Session Note  Patient Details  Name: Frank Wong MRN: [id number] Date of Birth: 1955/07/31 Referring Provider:   Flowsheet Row Cardiac Rehab from 05/31/2023 in [facility] Cardiac and Pulmonary Rehab  Referring Provider [name], MD       Encounter Date: 08/17/2023  Check In:  Session Check In - 08/17/23 1225       Check-In   Supervising physician immediately available to respond to emergencies See telemetry face sheet for immediately available ER MD    Location [facility]-Cardiac & Pulmonary Rehab    Staff Present Rory Percy, MS, Exercise Physiologist;Azariya Freeman, RN, BSN, CCRP;Joseph Hood RCP,RRT,BSRT    Virtual Visit No    Medication changes reported     No    Fall or balance concerns reported    No    Warm-up and Cool-down Performed on first and last piece of equipment    Resistance Training Performed Yes    VAD Patient? No    PAD/SET Patient? No      Pain Assessment   Currently in Pain? No/denies                Social History   Tobacco Use  Smoking Status Former   Current packs/day: 0.00   Types: Cigarettes   Quit date: 05/29/1993   Years since quitting: 30.2  Smokeless Tobacco Former   Types: Snuff, Chew   Quit date: 05/29/1978    Goals Met:  Independence with exercise equipment Exercise tolerated well No report of concerns or symptoms today  Goals Unmet:  Not Applicable  Comments: Pt able to follow exercise prescription today without complaint.    [name] graduated today from  rehab with 35 sessions completed.  Details of the patient's exercise prescription and what He needs to do in order to continue the prescription and progress were discussed with patient.  Patient was given a copy of prescription and goals.  Patient verbalized understanding. [name] plans to continue to exercise by exercising at home.   Dr. Bethann Punches is Medical Director for [facility] Cardiac Rehabilitation.  Dr. Vida Rigger is Medical Director for [facility]  Pulmonary Rehabilitation.

## 2023-09-13 ENCOUNTER — Encounter: Payer: Self-pay | Admitting: Dermatology

## 2023-09-13 ENCOUNTER — Ambulatory Visit: Admitting: Dermatology

## 2023-09-13 DIAGNOSIS — L570 Actinic keratosis: Secondary | ICD-10-CM

## 2023-09-13 DIAGNOSIS — Z872 Personal history of diseases of the skin and subcutaneous tissue: Secondary | ICD-10-CM

## 2023-09-13 NOTE — Patient Instructions (Signed)

## 2023-09-13 NOTE — Progress Notes (Signed)
   Follow-Up Visit   Subjective  Frank Wong is a 68 y.o. male who presents for the following: Spot on nose. >1 month. States has diabetes and areas heal slowly. Itched at first. Has a scab today. Non tender. Some peeling yesterday. Hx of AKs.   The patient has spots, moles and lesions to be evaluated, some may be new or changing and the patient may have concern these could be cancer.    The following portions of the chart were reviewed this encounter and updated as appropriate: medications, allergies, medical history  Review of Systems:  No other skin or systemic complaints except as noted in HPI or Assessment and Plan.  Objective  Well appearing patient in no apparent distress; mood and affect are within normal limits.  A focused examination was performed of the following areas: Face, nose  Relevant physical exam findings are noted in the Assessment and Plan.  Left Tip of Nose x1 Erythematous thin papules/macules with gritty scale.   Assessment & Plan   AK (ACTINIC KERATOSIS) Left Tip of Nose x1 Actinic keratoses are precancerous spots that appear secondary to cumulative UV radiation exposure/sun exposure over time. They are chronic with expected duration over 1 year. A portion of actinic keratoses will progress to squamous cell carcinoma of the skin. It is not possible to reliably predict which spots will progress to skin cancer and so treatment is recommended to prevent development of skin cancer.  Recommend daily broad spectrum sunscreen SPF 30+ to sun-exposed areas, reapply every 2 hours as needed.  Recommend staying in the shade or wearing long sleeves, sun glasses (UVA+UVB protection) and wide brim hats (4-inch brim around the entire circumference of the hat). Call for new or changing lesions.   Recheck at [facility] appointment 02/2024 Destruction of lesion - Left Tip of Nose x1 Complexity: simple   Destruction method: cryotherapy   Informed consent: discussed and  consent obtained   Timeout:  patient name, date of birth, surgical site, and procedure verified Lesion destroyed using liquid nitrogen: Yes   Region frozen until ice ball extended beyond lesion: Yes   Cryo cycles: 1 or 2. Outcome: patient tolerated procedure well with no complications   Post-procedure details: wound care instructions given   Additional details:  Prior to procedure, discussed risks of blister formation, small wound, skin dyspigmentation, or rare scar following cryotherapy. Recommend Vaseline ointment to treated areas while healing.    Return for TBSE As Scheduled, With Dr. Bary Likes.  I, Jill Parcell, CMA, am acting as scribe for Harris Liming, MD.   Documentation: I have reviewed the above documentation for accuracy and completeness, and I agree with the above.  Harris Liming, MD

## 2023-09-25 ENCOUNTER — Ambulatory Visit: Attending: Neurology | Admitting: Speech Pathology

## 2023-09-25 DIAGNOSIS — R41841 Cognitive communication deficit: Secondary | ICD-10-CM | POA: Insufficient documentation

## 2023-09-25 DIAGNOSIS — R413 Other amnesia: Secondary | ICD-10-CM | POA: Insufficient documentation

## 2023-09-25 NOTE — Therapy (Signed)
 OUTPATIENT SPEECH LANGUAGE PATHOLOGY  COGNITION EVALUATION ONLY   Patient Name: Frank Wong MRN: [id number] DOB:Sep 09, 1955, 68 y.o., male Today's Date: 09/25/2023  PCP: Martine Sleek, MD REFERRING PROVIDER: Devora Folks, MD   End of Session - 09/25/23 1245     Visit Number 1    Number of Visits 1    SLP Start Time 1145    SLP Stop Time  1218    SLP Time Calculation (min) 33 min    Activity Tolerance Patient tolerated treatment well             Past Medical History:  Diagnosis Date   Alcoholism (HCC)    Asthma    CAD (coronary artery disease)    Cardiac arrest (HCC)    Chronic cardiac arrhythmia    Chronic kidney disease    COPD (chronic obstructive pulmonary disease) (HCC)    Depression    Diabetes mellitus without complication (HCC)    Essential tremor    GERD (gastroesophageal reflux disease)    Heart attack (HCC)    Heart murmur    Hyperlipidemia    Hypertension    IBS (irritable bowel syndrome)    Shoulder problem 2010   right   Sinoatrial node dysfunction (HCC)    Sleep apnea    Past Surgical History:  Procedure Laterality Date   BALLOON DILATION N/A 06/11/2017   Procedure: BALLOON DILATION;  Surgeon: Cassie Click, MD;  Location: [facility] ENDOSCOPY;  Service: Endoscopy;  Laterality: N/A;   COLONOSCOPY WITH PROPOFOL  N/A 01/10/2021   Procedure: COLONOSCOPY WITH PROPOFOL ;  Surgeon: Shane Darling, MD;  Location: [facility] ENDOSCOPY;  Service: Endoscopy;  Laterality: N/A;   CORONARY ANGIOPLASTY WITH STENT PLACEMENT     CORONARY STENT INTERVENTION N/A 01/16/2018   Procedure: CORONARY STENT INTERVENTION;  Surgeon: Antonette Batters, MD;  Location: [facility] INVASIVE CV LAB;  Service: Cardiovascular;  Laterality: N/A;   CORONARY STENT INTERVENTION N/A 10/06/2020   Procedure: CORONARY STENT INTERVENTION;  Surgeon: Percival Brace, MD;  Location: [facility] INVASIVE CV LAB;  Service: Cardiovascular;  Laterality: N/A;  LAD   CORONARY STENT INTERVENTION N/A  05/02/2023   Procedure: CORONARY STENT INTERVENTION;  Surgeon: Percival Brace, MD;  Location: [facility] INVASIVE CV LAB;  Service: Cardiovascular;  Laterality: N/A;   ESOPHAGOGASTRODUODENOSCOPY (EGD) WITH PROPOFOL  N/A 06/11/2017   Procedure: ESOPHAGOGASTRODUODENOSCOPY (EGD) WITH PROPOFOL ;  Surgeon: Cassie Click, MD;  Location: [facility] ENDOSCOPY;  Service: Endoscopy;  Laterality: N/A;   LEFT HEART CATH AND CORONARY ANGIOGRAPHY Left 01/16/2018   Procedure: LEFT HEART CATH AND CORONARY ANGIOGRAPHY;  Surgeon: Michelle Aid, MD;  Location: [facility] INVASIVE CV LAB;  Service: Cardiovascular;  Laterality: Left;   LEFT HEART CATH AND CORONARY ANGIOGRAPHY N/A 10/06/2020   Procedure: LEFT HEART CATH AND CORONARY ANGIOGRAPHY;  Surgeon: Michelle Aid, MD;  Location: [facility] INVASIVE CV LAB;  Service: Cardiovascular;  Laterality: N/A;   LEFT HEART CATH AND CORONARY ANGIOGRAPHY N/A 06/27/2021   Procedure: LEFT HEART CATH AND CORONARY ANGIOGRAPHY;  Surgeon: Michelle Aid, MD;  Location: [facility] INVASIVE CV LAB;  Service: Cardiovascular;  Laterality: N/A;   LEFT HEART CATH AND CORS/GRAFTS ANGIOGRAPHY Left 05/02/2023   Procedure: LEFT HEART CATH AND CORS/GRAFTS ANGIOGRAPHY;  Surgeon: Percival Brace, MD;  Location: [facility] INVASIVE CV LAB;  Service: Cardiovascular;  Laterality: Left;   RHINOPLASTY     SHOULDER SURGERY     SIGMOIDOSCOPY     Patient Active Problem List   Diagnosis Date Noted   Chronic obstructive pulmonary disease (HCC)  07/22/2021   Bilateral dry eyes 07/09/2021   S/P coronary artery bypass graft x 2 07/08/2021   Major depressive disorder, recurrent episode, moderate with mixed features (HCC) 02/18/2021   Angina pectoris (HCC) 10/06/2020   Unstable angina pectoris (HCC) 10/06/2020   S/P drug eluting coronary stent placement 01/16/2018   Angina decubitus (HCC) 01/10/2018   CKD (chronic kidney disease) stage 3, GFR 30-59 ml/min (HCC) 12/31/2017   Chronic left shoulder pain 04/07/2016    Enteritis presumed infectious 01/27/2016   Benign essential hypertension 10/12/2014   Hyperlipemia, mixed 04/17/2014   Diabetes mellitus type 2, diet-controlled (HCC) 03/25/2014   S/P arthroscopy of shoulder 12/20/2011   CAD (coronary artery disease) 11/22/2011    ONSET DATE: "going on for a year or so"; date of referral 08/27/2023  REFERRING DIAG: R41.3 (ICD-10-CM) - Memory impairment   THERAPY DIAG:  Memory loss  Cognitive communication deficit  Rationale for Evaluation and Treatment Rehabilitation  SUBJECTIVE:   SUBJECTIVE STATEMENT: Pt pleasant, eager, appeared to be good historian Pt accompanied by: self  PERTINENT HISTORY and DIAGNOSTIC FINDINGS:  Pt is a 68 year old male with past medical history type 2 diabetes mellitus and diabetic neuropathy, stage 3b chronic kidney disease, depression, COPD, essential tremor, GERD, heart attack (2010), alcoholism (sober > 40 years).  Pt initially presented to neurologist (06/21/2021) with concern for memory ipairment in a pt with short attention span. Placed on Adderall XR 10 mg and pt also reports some memory deficits "years ago" related to depression. He currently reports that his depression is "well managed" with Zoloft. 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   CT head for presurgical planning for focused ultrasound ablation to treatment essential tremeor at [facility] 09/21/2023 revealed mild bifrontal volume loss with mild sulcal prominence and ex vacuo prominence of the frontal horns. There is no midline shift. No mass or lesion. There is no evident of acute infarct.   MRI 09/21/2023 There are scattered foci of signal abnormality within the preventricular and deep what matter. These are nonspecific but commonly seen with small vessel ischemic changes. mild global cerebral parenchymal volume loss and and proportional ex vacuo dilatation of the CSF-containing spaces. Small focus of chronic microhemorrhage in the  right cerebellum.   PAIN:  Are you having pain? No   FALLS: Has patient fallen in last 6 months?  No  LIVING ENVIRONMENT: Lives with: lives with their spouse Lives in: House/apartment  PLOF:  Level of assistance: Independent with ADLs, Independent with IADLs Employment: Full-time employment   PATIENT GOALS   to assess word finding deficits  OBJECTIVE:   COGNITIVE COMMUNICATION Overall cognitive status: Within functional limits for tasks assessed  AUDITORY COMPREHENSION  Overall auditory comprehension: Appears intact YES/NO questions: Appears intact Following directions: Appears intact Conversation: Complex  EXPRESSION: verbal  VERBAL EXPRESSION:   Overall verbal expression: Appears intact Level of generative/spontaneous verbalization: conversation Automatic speech: name: intact and social response: intact  Repetition: Appears intact Pragmatics: Appears intact Non-verbal means of communication: N/A  WRITTEN EXPRESSION: Dominant hand: right Written expression: Not tested  ORAL MOTOR EXAMINATION Facial : WFL Lingual: WFL Velum: WFL Mandible: WFL Cough: WFL Voice: WFL  MOTOR SPEECH: Overall motor speech: Appears intact Respiration: diaphragmatic/abdominal breathing Phonation: normal Resonance: WFL Articulation: Appears intact Intelligibility: Intelligible Motor planning: Appears intact  STANDARDIZED ASSESSMENTS: The HVLT-R is a list learning test, which consists of 12 nouns within three semantic groups. The test has three learning trials in which the administrator reads the  words aloud and then asks the patient to repeat as many as he/she can remember in any order. The three learning trials are used to calculate a Total Recall Score and Recognition Discrimination Index (RDI).  Immediate Recall: Trial 1: 7 (mean 5.8 / SD: 1.9)   Trial 2: 10 (mean 8.3 / SD: 2) Trial 3: 12 (mean 9.4 / SD: 2) Total Recall Score:  29 (mean 23.4/ SD 5.2)  Delayed  Recognition True positive responses: 12  False-positive errors: 1  Recognition Discrimination Index (RDI): 11 (mean 11)  Pt's performance was above the average range    and  Pt's phonemic and semantic verbal fluency was measured by the abilitiy to generate words beginning with a specific letter (e.g., FAS) and semantic category (e.g., animals). Verbal Fluency has been demonstrated to be sensitive to lesions in the frontal lobe, temporal lobe and caudate nucleus (Butters et al. Arletha Lady), Alzheimer's disease Mosetta Areola, Finley Hugh, Encarnacion Harris, & Sherwood 1996), Huntington's disease Annalee Kiang et al. Arletha Lady), amnesia Annalee Kiang et. al 1987), and traumatic brain injury (Rasking & Rearick, 1996).   Patients were asked to generate as words as possible in 1 minute for the letters F, A, and S (phonemic fluency) with further instructions that nouns and multiple words using the same stem with a different suffix (e.g., friend, friends, friendly) were not acceptable.   When compared to peers of the same and educational level, pt's abilities were considered:   F: 11 A:14 S:15  Total Number of Words Recalled: 50;  giving patient a percentile score 90 when compared to male ages 60-79 years years with an education level of 13-21.   Animals: 24 Giving patient a percentile score of 90 when compared to male ages 51-79 years with an education level of 13-21     PATIENT EDUCATION: Education details: results of this assessment, ability considered to be within functional/normal abilities Person educated: Patient Education method: Explanation Education comprehension: verbalized understanding   HOME EXERCISE PROGRAM:   Continue reading, working, using word finding strategies, board games    ASSESSMENT:  CLINICAL IMPRESSION: Patient is a 68 y.o. male who was seen today for a cognitive communication evaluation d/t report of word finding difficulty. 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 During this assessment, pt presents with wording finding and cognitive communication that are within normal limits for age and gender. Education provided continuing to use word finding strategies and age-related cognitive communication changes. Pt voiced understanding and will seek re-consult should his deficits increase and/or prevent him from participating in functional ADLs and iADLs.    PLAN:  Pt presents with cognitive communication abilities that are considered to be within the average range. No further services are indicated at this time. Pt voiced agreement.    Ardith Lewman B. Garlin Junker, M.S., CCC-SLP, Tree surgeon Certified Brain Injury Specialist [facility]  [facility] Office [phone] Ascom [phone] Fax [phone]

## 2023-09-27 ENCOUNTER — Ambulatory Visit: Admitting: Speech Pathology

## 2023-10-02 ENCOUNTER — Encounter: Admitting: Speech Pathology

## 2023-10-04 ENCOUNTER — Encounter: Admitting: Speech Pathology

## 2023-10-09 ENCOUNTER — Encounter: Admitting: Speech Pathology

## 2023-10-11 ENCOUNTER — Encounter: Admitting: Speech Pathology

## 2023-10-16 ENCOUNTER — Encounter: Admitting: Speech Pathology

## 2023-10-18 ENCOUNTER — Encounter: Admitting: Speech Pathology

## 2023-10-23 ENCOUNTER — Encounter: Admitting: Speech Pathology

## 2023-10-25 ENCOUNTER — Encounter: Admitting: Speech Pathology

## 2023-10-30 ENCOUNTER — Encounter: Admitting: Speech Pathology

## 2023-11-01 ENCOUNTER — Encounter: Admitting: Speech Pathology

## 2023-11-06 ENCOUNTER — Encounter: Admitting: Speech Pathology

## 2023-11-08 ENCOUNTER — Encounter: Admitting: Speech Pathology

## 2024-01-18 ENCOUNTER — Other Ambulatory Visit: Payer: Self-pay | Admitting: Internal Medicine

## 2024-01-18 DIAGNOSIS — R590 Localized enlarged lymph nodes: Secondary | ICD-10-CM

## 2024-01-25 ENCOUNTER — Ambulatory Visit
Admission: RE | Admit: 2024-01-25 | Discharge: 2024-01-25 | Disposition: A | Discharge status: Home / Self Care | Source: Ambulatory Visit | Attending: Internal Medicine | Admitting: Internal Medicine

## 2024-01-25 DIAGNOSIS — R590 Localized enlarged lymph nodes: Secondary | ICD-10-CM | POA: Insufficient documentation

## 2024-01-25 MED ORDER — IOHEXOL 300 MG/ML  SOLN
75.0000 mL | Freq: Once | INTRAMUSCULAR | Status: AC | PRN
  Administered 2024-01-25: 75 mL via INTRAVENOUS

## 2024-03-06 ENCOUNTER — Ambulatory Visit: Admitting: Dermatology

## 2024-03-19 ENCOUNTER — Ambulatory Visit: Payer: 59 | Admitting: Dermatology

## 2024-03-19 ENCOUNTER — Encounter: Payer: Self-pay | Admitting: Dermatology

## 2024-03-19 DIAGNOSIS — L2089 Other atopic dermatitis: Secondary | ICD-10-CM

## 2024-03-19 DIAGNOSIS — L821 Other seborrheic keratosis: Secondary | ICD-10-CM

## 2024-03-19 DIAGNOSIS — L578 Other skin changes due to chronic exposure to nonionizing radiation: Secondary | ICD-10-CM

## 2024-03-19 DIAGNOSIS — L814 Other melanin hyperpigmentation: Secondary | ICD-10-CM | POA: Diagnosis not present

## 2024-03-19 DIAGNOSIS — L570 Actinic keratosis: Secondary | ICD-10-CM | POA: Diagnosis not present

## 2024-03-19 DIAGNOSIS — Z1283 Encounter for screening for malignant neoplasm of skin: Secondary | ICD-10-CM | POA: Diagnosis not present

## 2024-03-19 DIAGNOSIS — Z79899 Other long term (current) drug therapy: Secondary | ICD-10-CM

## 2024-03-19 DIAGNOSIS — L509 Urticaria, unspecified: Secondary | ICD-10-CM

## 2024-03-19 DIAGNOSIS — L719 Rosacea, unspecified: Secondary | ICD-10-CM

## 2024-03-19 DIAGNOSIS — W908XXA Exposure to other nonionizing radiation, initial encounter: Secondary | ICD-10-CM

## 2024-03-19 DIAGNOSIS — D692 Other nonthrombocytopenic purpura: Secondary | ICD-10-CM

## 2024-03-19 DIAGNOSIS — L209 Atopic dermatitis, unspecified: Secondary | ICD-10-CM

## 2024-03-19 DIAGNOSIS — D1801 Hemangioma of skin and subcutaneous tissue: Secondary | ICD-10-CM

## 2024-03-19 DIAGNOSIS — L304 Erythema intertrigo: Secondary | ICD-10-CM

## 2024-03-19 DIAGNOSIS — Z7189 Other specified counseling: Secondary | ICD-10-CM

## 2024-03-19 DIAGNOSIS — R21 Rash and other nonspecific skin eruption: Secondary | ICD-10-CM

## 2024-03-19 DIAGNOSIS — D229 Melanocytic nevi, unspecified: Secondary | ICD-10-CM

## 2024-03-19 MED ORDER — EUCRISA 2 % EX OINT: TOPICAL_OINTMENT | CUTANEOUS | 5 refills | Status: AC

## 2024-03-19 NOTE — Patient Instructions (Signed)

## 2024-03-19 NOTE — Progress Notes (Signed)
 Follow-Up Visit   Subjective  Frank Wong is a 68 y.o. male who presents for the following: Skin Cancer Screening and Full Body Skin Exam  The patient presents for Total-Body Skin Exam (TBSE) for skin cancer screening and mole check. The patient has spots, moles and lesions to be evaluated, some may be new or changing and the patient may have concern these could be cancer.  The following portions of the chart were reviewed this encounter and updated as appropriate: medications, allergies, medical history  Review of Systems:  No other skin or systemic complaints except as noted in HPI or Assessment and Plan.  Objective  Well appearing patient in no apparent distress; mood and affect are within normal limits.  A full examination was performed including scalp, head, eyes, ears, nose, lips, neck, chest, axillae, abdomen, back, buttocks, bilateral upper extremities, bilateral lower extremities, hands, feet, fingers, toes, fingernails, and toenails. All findings within normal limits unless otherwise noted below.   Relevant physical exam findings are noted in the Assessment and Plan. Scalp x 1 Erythematous thin papules/macules with gritty scale.   Assessment & Plan   SKIN CANCER SCREENING PERFORMED TODAY.  ACTINIC DAMAGE - Chronic condition, secondary to cumulative UV/sun exposure - diffuse scaly erythematous macules with underlying dyspigmentation - Recommend daily broad spectrum sunscreen SPF 30+ to sun-exposed areas, reapply every 2 hours as needed.  - Staying in the shade or wearing long sleeves, sun glasses (UVA+UVB protection) and wide brim hats (4-inch brim around the entire circumference of the hat) are also recommended for sun protection.  - Call for new or changing lesions.  LENTIGINES, SEBORRHEIC KERATOSES, HEMANGIOMAS - Benign normal skin lesions - Benign-appearing - Call for any changes  MELANOCYTIC NEVI - Tan-brown and/or pink-flesh-colored symmetric macules and  papules - Benign appearing on exam today - Observation - Call clinic for new or changing moles - Recommend daily use of broad spectrum spf 30+ sunscreen to sun-exposed areas.   INTERTRIGO Exam Erythematous macerated patches of groin Chronic condition with duration or expected duration over one year. Currently well-controlled. Intertrigo is a chronic recurrent rash that occurs in skin fold areas that may be associated with friction; heat; moisture; yeast; fungus; and bacteria.  It is exacerbated by increased movement / activity; sweating; and higher atmospheric temperature. Treatment Plan Continue Skin Medicinals intertrigo mix.  Rash - hx of urticaria Exam: Clear today. Treatment Plan: Continue antihistamine daily (Allegra). Discussed Xolair  (pt has used in the past and didn't help), Dupixent, and oral Rhapsido should patient continue to flare.  ATOPIC DERMATITIS Exam: Peeling with erythema. 8% BSA Chronic and persistent condition with duration or expected duration over one year. Condition is symptomatic/ bothersome to patient. Not currently at goal. Atopic dermatitis (eczema) is a chronic, relapsing, pruritic condition that can significantly affect quality of life. It is often associated with allergic rhinitis and/or asthma and can require treatment with topical medications, phototherapy, or in severe cases biologic injectable medication (Dupixent; Adbry) or Oral JAK inhibitors. Treatment Plan: Pt has tried and failed HC cream. D/C TMC due to potential side effects. Start Eucrisa 2% ointment to aa's BID. May restart TMC 0.1% cream to more stubborn areas not resolving with Eucrisa ointment. Recommend gentle skin care.  Purpura - Chronic; persistent and recurrent.  Treatable, but not curable. - Violaceous macules and patches - Benign - Related to trauma, age, sun damage and/or use of blood thinners, chronic use of topical and/or oral steroids - Observe - Can use OTC arnica  containing moisturizer such as Dermend Bruise Formula if desired - Call for worsening or other concerns  ROSACEA Exam Mid face erythema with telangiectasias Rosacea is a chronic progressive skin condition usually affecting the face of adults, causing redness and/or acne bumps. It is treatable but not curable. It sometimes affects the eyes (ocular rosacea) as well. It may respond to topical and/or systemic medication and can flare with stress, sun exposure, alcohol, exercise, topical steroids (including hydrocortisone/cortisone 10) and some foods.  Daily application of broad spectrum spf 30+ sunscreen to face is recommended to reduce flares. Treatment Plan Mild, no tx needed.  HISTORY OF PRECANCEROUS ACTINIC KERATOSIS - L tip of nose - site(s) of PreCancerous Actinic Keratosis clear today. - these may recur and new lesions may form requiring treatment to prevent transformation into skin cancer - observe for new or changing spots and contact [facility] for appointment if occur - photoprotection with sun protective clothing; sunglasses and broad spectrum sunscreen with SPF of at least 30 + and frequent self skin exams recommended - yearly exams by a dermatologist recommended for persons with history of PreCancerous Actinic Keratoses AK (ACTINIC KERATOSIS) Scalp x 1 Actinic keratoses are precancerous spots that appear secondary to cumulative UV radiation exposure/sun exposure over time. They are chronic with expected duration over 1 year. A portion of actinic keratoses will progress to squamous cell carcinoma of the skin. It is not possible to reliably predict which spots will progress to skin cancer and so treatment is recommended to prevent development of skin cancer.  Recommend daily broad spectrum sunscreen SPF 30+ to sun-exposed areas, reapply every 2 hours as needed.  Recommend staying in the shade or wearing long sleeves, sun glasses (UVA+UVB protection) and wide brim hats (4-inch  brim around the entire circumference of the hat). Call for new or changing lesions. Destruction of lesion - Scalp x 1 Complexity: simple   Destruction method: cryotherapy   Informed consent: discussed and consent obtained   Timeout:  patient name, date of birth, surgical site, and procedure verified Lesion destroyed using liquid nitrogen: Yes   Region frozen until ice ball extended beyond lesion: Yes   Outcome: patient tolerated procedure well with no complications   Post-procedure details: wound care instructions given     Return in about 1 year (around 03/19/2025) for TBSE.  Frank Wong, CMA, am acting as scribe for Alm Rhyme, MD .   Documentation: I have reviewed the above documentation for accuracy and completeness, and I agree with the above.  Alm Rhyme, MD

## 2024-04-28 ENCOUNTER — Ambulatory Visit: Admitting: Urology

## 2024-04-28 NOTE — Progress Notes (Unsigned)
 04/30/2024 9:22 PM   Frank Wong May 24, 1956 [id number]  Referring provider: Rudolpho Norleen BIRCH, MD [address] [facility] [facility] [facility] [place],  [place] [address]  Urological history: 1. Erectile Dysfunction -contributing factors of age, CAD, HTN, COPD, DM, CKD, HLD, depression and history of smoking -failed PDE5i's, Trimix, VED  2. BPH w/LU TS -PSA (03/2024) 0.19  3. Hypogonadism -contributing factors of age, DM and CKD -testosterone  pending -Hemoglobin/hematocrit (03/2024) 16.3/46.4 -testosterone  gel (1.62%), 2 pumps daily   No chief complaint on file.  HPI: Frank Wong is a 68 y.o. man who presents today for follow up.    Previous records reviewed.   He reports good adherence to testosterone  1.62% gel, 2 pumps daily  Denies new complaints of low libido, erectile dysfunction, fatigue, or mood changes.  No complaints of gynecomastia, visual changes, or thromboembolic symptoms.  Energy level, libido and overall sense of wellbeing being reported as stable/ improved compared to prior visit.    Testosterone  level pending  Hemoglobin/hematocrit normal   Liver enzymes alk phos elevated   I PSS ***  He reports sensation of incomplete bladder emptying,   urinary frequency,   urinary intermittency,   urinary urgency,   a weak urinary stream,   having to strain to void,   nocturia x ***,   leaking before being able to reach the restroom,   leaking with coughing,   leaking without awareness,   and post void dribbling.     He is wearing *** pads//depends  daily.    Patient denies any modifying or aggravating factors.  Patient denies any recent UTI's, gross hematuria, dysuria or suprapubic/flank pain.  Patient denies any fevers, chills, nausea or vomiting.  ***  He has a family history of PCa, colon cancer, ovarian cancer and/or breast cancer with ***.   He does not have a family history of PCa, colon cancer, ovarian cancer, and/or breast cancer .***      UA (03/2024) 4+ glucose  PVR***  PSA (03/2024) 0.19  Serum creatinine (03/2024) 1.4, eGFR 55  Hemoglobin A1c (03/2024) 6.3  PMH: Past Medical History:  Diagnosis Date   Alcoholism (HCC)    Asthma    CAD (coronary artery disease)    Cardiac arrest (HCC)    Chronic cardiac arrhythmia    Chronic kidney disease    COPD (chronic obstructive pulmonary disease) (HCC)    Depression    Diabetes mellitus without complication (HCC)    Essential tremor    GERD (gastroesophageal reflux disease)    Heart attack (HCC)    Heart murmur    Hyperlipidemia    Hypertension    IBS (irritable bowel syndrome)    Shoulder problem 2010   right   Sinoatrial node dysfunction (HCC)    Sleep apnea     Surgical History: Past Surgical History:  Procedure Laterality Date   BALLOON DILATION N/A 06/11/2017   Procedure: BALLOON DILATION;  Surgeon: Viktoria Lamar DASEN, MD;  Location: [facility] ENDOSCOPY;  Service: Endoscopy;  Laterality: N/A;   COLONOSCOPY WITH PROPOFOL  N/A 01/10/2021   Procedure: COLONOSCOPY WITH PROPOFOL ;  Surgeon: Maryruth Ole DASEN, MD;  Location: [facility] ENDOSCOPY;  Service: Endoscopy;  Laterality: N/A;   CORONARY ANGIOPLASTY WITH STENT PLACEMENT     CORONARY STENT INTERVENTION N/A 01/16/2018   Procedure: CORONARY STENT INTERVENTION;  Surgeon: Florencio Cara BIRCH, MD;  Location: [facility] INVASIVE CV LAB;  Service: Cardiovascular;  Laterality: N/A;   CORONARY STENT INTERVENTION N/A 10/06/2020   Procedure: CORONARY STENT INTERVENTION;  Surgeon: Ammon Blunt, MD;  Location: [facility] INVASIVE CV LAB;  Service: Cardiovascular;  Laterality: N/A;  LAD   CORONARY STENT INTERVENTION N/A 05/02/2023   Procedure: CORONARY STENT INTERVENTION;  Surgeon: Ammon Blunt, MD;  Location: [facility] INVASIVE CV LAB;  Service: Cardiovascular;  Laterality: N/A;   ESOPHAGOGASTRODUODENOSCOPY (EGD) WITH PROPOFOL  N/A 06/11/2017   Procedure: ESOPHAGOGASTRODUODENOSCOPY (EGD) WITH PROPOFOL ;  Surgeon: Viktoria Lamar DASEN, MD;  Location: [facility] ENDOSCOPY;  Service: Endoscopy;  Laterality: N/A;   LEFT HEART CATH AND CORONARY ANGIOGRAPHY Left 01/16/2018   Procedure: LEFT HEART CATH AND CORONARY ANGIOGRAPHY;  Surgeon: Hester Wolm PARAS, MD;  Location: [facility] INVASIVE CV LAB;  Service: Cardiovascular;  Laterality: Left;   LEFT HEART CATH AND CORONARY ANGIOGRAPHY N/A 10/06/2020   Procedure: LEFT HEART CATH AND CORONARY ANGIOGRAPHY;  Surgeon: Hester Wolm PARAS, MD;  Location: [facility] INVASIVE CV LAB;  Service: Cardiovascular;  Laterality: N/A;   LEFT HEART CATH AND CORONARY ANGIOGRAPHY N/A 06/27/2021   Procedure: LEFT HEART CATH AND CORONARY ANGIOGRAPHY;  Surgeon: Hester Wolm PARAS, MD;  Location: [facility] INVASIVE CV LAB;  Service: Cardiovascular;  Laterality: N/A;   LEFT HEART CATH AND CORS/GRAFTS ANGIOGRAPHY Left 05/02/2023   Procedure: LEFT HEART CATH AND CORS/GRAFTS ANGIOGRAPHY;  Surgeon: Ammon Blunt, MD;  Location: [facility] INVASIVE CV LAB;  Service: Cardiovascular;  Laterality: Left;   RHINOPLASTY     SHOULDER SURGERY     SIGMOIDOSCOPY      Home Medications:  Allergies as of 04/30/2024       Reactions   Penicillamine Hives   Penicillins Other (See Comments)   Has patient had a PCN reaction causing immediate rash, facial/tongue/throat swelling, SOB or lightheadedness with hypotension: No Has patient had a PCN reaction causing severe rash involving mucus membranes or skin necrosis: No Has patient had a PCN reaction that required hospitalization No Has patient had a PCN reaction occurring within the last 10 years: No If all of the above answers are NO, then may proceed with Cephalosporin use.        Medication List        Accurate as of April 28, 2024  9:22 PM. If you have any questions, ask your nurse or doctor.          acidophilus Caps capsule Take 1 capsule by mouth daily.   amphetamine-dextroamphetamine 10 MG 24 hr capsule Commonly known as: ADDERALL XR Take 10 mg by mouth daily.   aspirin   EC 81 MG tablet Take 81 mg by mouth in the morning.   atorvastatin  80 MG tablet Commonly known as: LIPITOR  Take 80 mg by mouth in the morning.   B-complex with vitamin C tablet Take 1 tablet by mouth in the morning.   clonazePAM  2 MG tablet Commonly known as: KLONOPIN  Take 1 mg by mouth at bedtime as needed (sleep/ anxiety).   Eucrisa  2 % Oint Generic drug: Crisaborole  Apply to aa's rash BID PRN.   fexofenadine 180 MG tablet Commonly known as: ALLEGRA Take 180 mg by mouth in the morning.   fluticasone  50 MCG/ACT nasal spray Commonly known as: FLONASE  Place 1 spray into both nostrils daily for 14 days.   Fluticasone -Umeclidin-Vilant 100-62.5-25 MCG/ACT Aepb Inhale 1 puff into the lungs daily.   gabapentin  600 MG tablet Commonly known as: NEURONTIN  Take 600 mg by mouth 2 (two) times daily.   glipiZIDE  2.5 MG 24 hr tablet Commonly known as: GLUCOTROL  XL Take 2.5 mg by mouth daily.   isosorbide mononitrate 30 MG 24 hr tablet Commonly known  as: IMDUR Take 30 mg by mouth daily.   Jardiance  25 MG Tabs tablet Generic drug: empagliflozin  Take 25 mg by mouth in the morning.   levalbuterol 45 MCG/ACT inhaler Commonly known as: XOPENEX HFA Inhale 1-2 puffs into the lungs every 6 (six) hours as needed for wheezing or shortness of breath.   levocetirizine 5 MG tablet Commonly known as: XYZAL  Take 5 mg by mouth every evening.   lisinopril 2.5 MG tablet Commonly known as: ZESTRIL Take 2.5 mg by mouth daily.   magnesium  oxide 400 MG tablet Commonly known as: MAG-OX Take 400 mg by mouth at bedtime.   metFORMIN 1000 MG tablet Commonly known as: GLUCOPHAGE Take 1,000 mg by mouth 2 (two) times daily with a meal.   montelukast  10 MG tablet Commonly known as: SINGULAIR  Take 10 mg by mouth at bedtime.   multivitamin with minerals Tabs tablet Take 1 tablet by mouth in the morning.   nitroGLYCERIN  0.4 MG SL tablet Commonly known as: NITROSTAT  Place 0.4 mg under the  tongue every 5 (five) minutes as needed for chest pain.   NON FORMULARY Pt uses a bi-pap nightly   pantoprazole  40 MG tablet Commonly known as: PROTONIX  Take 40 mg by mouth at bedtime.   prasugrel  10 MG Tabs tablet Commonly known as: Effient  Take 1 tablet (10 mg total) by mouth daily.   pseudoephedrine  60 MG tablet Commonly known as: SUDAFED Take 1 tablet (60 mg total) by mouth every 6 (six) hours as needed.   sertraline 25 MG tablet Commonly known as: ZOLOFT Take 25 mg by mouth daily.   Testosterone  1.62 % Gel APPLY TWO (2) PUMPS TOPICALLY IN THE MORNING TO THE SHOULDERS AS DIRECTED   traZODone 50 MG tablet Commonly known as: DESYREL Take 50 mg by mouth at bedtime.   Vitamin D-1000 Max St 25 MCG (1000 UT) tablet Generic drug: Cholecalciferol Take 1,000 Units by mouth daily.        Allergies:  Allergies  Allergen Reactions   Penicillamine Hives   Penicillins Other (See Comments)    Has patient had a PCN reaction causing immediate rash, facial/tongue/throat swelling, SOB or lightheadedness with hypotension: No Has patient had a PCN reaction causing severe rash involving mucus membranes or skin necrosis: No Has patient had a PCN reaction that required hospitalization No Has patient had a PCN reaction occurring within the last 10 years: No If all of the above answers are NO, then may proceed with Cephalosporin use.     Family History: No family history on file.  Social History:  reports that he quit smoking about 30 years ago. His smoking use included cigarettes. He quit smokeless tobacco use about 45 years ago.  His smokeless tobacco use included snuff and chew. He reports that he does not drink alcohol and does not use drugs.  ROS: Pertinent ROS in HPI  Physical Exam: There were no vitals taken for this visit.  Constitutional:  Well nourished. Alert and oriented, No acute distress. HEENT: [place] AT, moist mucus membranes.  Trachea midline, no  masses. Cardiovascular: No clubbing, cyanosis, or edema. Respiratory: Normal respiratory effort, no increased work of breathing. GI: Abdomen is soft, non tender, non distended, no abdominal masses. Liver and spleen not palpable.  No hernias appreciated.  Stool sample for occult testing is not indicated.   GU: No CVA tenderness.  No bladder fullness or masses.  Patient with circumcised/uncircumcised phallus. ***Foreskin easily retracted***  Urethral meatus is patent.  No penile discharge. No penile  lesions or rashes. Scrotum without lesions, cysts, rashes and/or edema.  Testicles are located scrotally bilaterally. No masses are appreciated in the testicles. Left and right epididymis are normal. Rectal: Patient with  normal sphincter tone. Anus and perineum without scarring or rashes. No rectal masses are appreciated. Prostate is approximately *** grams, *** nodules are appreciated. Seminal vesicles are normal. Skin: No rashes, bruises or suspicious lesions. Lymph: No cervical or inguinal adenopathy. Neurologic: Grossly intact, no focal deficits, moving all 4 extremities. Psychiatric: Normal mood and affect.   Laboratory Data: See Epic and HPI   I have reviewed the labs.   Pertinent Imaging: N/A  Assessment & Plan:    1. Hypogonadism -testosterone  levels pending -Hemoglobin/hematocrit normal  -testosterone  gel 1.62%, 2 pumps daily  2. BPH with LUTS -PSA stable  -UA benign -continue conservative management, avoiding bladder irritants and timed voiding's  3. Erectile dysfunction:    - gave  him Dr. Evetta info for him to make an appointment for IPP cosultation  No follow-ups on file.  These notes generated with voice recognition software. I apologize for typographical errors.  CLOTILDA HELON RIGGERS  [facility] [address]  [address] [place], [place] [address] [phone]

## 2024-04-30 ENCOUNTER — Encounter: Payer: Self-pay | Admitting: Urology

## 2024-04-30 ENCOUNTER — Ambulatory Visit: Admitting: Urology

## 2024-04-30 VITALS — BP 108/69 | HR 80 | Ht 67.0 in | Wt 170.0 lb

## 2024-04-30 DIAGNOSIS — N138 Other obstructive and reflux uropathy: Secondary | ICD-10-CM

## 2024-04-30 DIAGNOSIS — N529 Male erectile dysfunction, unspecified: Secondary | ICD-10-CM

## 2024-04-30 DIAGNOSIS — N401 Enlarged prostate with lower urinary tract symptoms: Secondary | ICD-10-CM

## 2024-04-30 DIAGNOSIS — E291 Testicular hypofunction: Secondary | ICD-10-CM

## 2024-04-30 MED ORDER — TESTOSTERONE 20.25 MG/ACT (1.62%) TD GEL: TRANSDERMAL | 3 refills | Status: DC

## 2024-05-01 ENCOUNTER — Ambulatory Visit: Payer: Self-pay | Admitting: Urology

## 2024-05-01 ENCOUNTER — Other Ambulatory Visit: Payer: Self-pay | Admitting: Urology

## 2024-05-01 DIAGNOSIS — N529 Male erectile dysfunction, unspecified: Secondary | ICD-10-CM

## 2024-05-01 LAB — TESTOSTERONE: Testosterone: 274 ng/dL (ref 264–916)

## 2024-05-01 MED ORDER — AMBULATORY NON FORMULARY MEDICATION: 0 refills | Status: AC

## 2024-05-20 ENCOUNTER — Encounter: Payer: Self-pay | Admitting: *Deleted

## 2024-06-02 ENCOUNTER — Ambulatory Visit
Admission: RE | Admit: 2024-06-02 | Discharge: 2024-06-02 | Disposition: A | Discharge status: Home / Self Care | Attending: Gastroenterology | Admitting: Gastroenterology

## 2024-06-02 ENCOUNTER — Other Ambulatory Visit: Payer: Self-pay

## 2024-06-02 ENCOUNTER — Ambulatory Visit: Admitting: Anesthesiology

## 2024-06-02 ENCOUNTER — Encounter
Admission: RE | Disposition: A | Discharge status: Home / Self Care | Payer: Self-pay | Source: Home / Self Care | Attending: Gastroenterology

## 2024-06-02 DIAGNOSIS — F32A Depression, unspecified: Secondary | ICD-10-CM | POA: Insufficient documentation

## 2024-06-02 DIAGNOSIS — Z7984 Long term (current) use of oral hypoglycemic drugs: Secondary | ICD-10-CM | POA: Insufficient documentation

## 2024-06-02 DIAGNOSIS — N189 Chronic kidney disease, unspecified: Secondary | ICD-10-CM | POA: Insufficient documentation

## 2024-06-02 DIAGNOSIS — R1310 Dysphagia, unspecified: Secondary | ICD-10-CM | POA: Diagnosis present

## 2024-06-02 DIAGNOSIS — K2950 Unspecified chronic gastritis without bleeding: Secondary | ICD-10-CM | POA: Diagnosis not present

## 2024-06-02 DIAGNOSIS — I129 Hypertensive chronic kidney disease with stage 1 through stage 4 chronic kidney disease, or unspecified chronic kidney disease: Secondary | ICD-10-CM | POA: Insufficient documentation

## 2024-06-02 DIAGNOSIS — K3189 Other diseases of stomach and duodenum: Secondary | ICD-10-CM | POA: Insufficient documentation

## 2024-06-02 DIAGNOSIS — E1122 Type 2 diabetes mellitus with diabetic chronic kidney disease: Secondary | ICD-10-CM | POA: Insufficient documentation

## 2024-06-02 DIAGNOSIS — Z7985 Long-term (current) use of injectable non-insulin antidiabetic drugs: Secondary | ICD-10-CM | POA: Insufficient documentation

## 2024-06-02 DIAGNOSIS — K449 Diaphragmatic hernia without obstruction or gangrene: Secondary | ICD-10-CM | POA: Diagnosis not present

## 2024-06-02 DIAGNOSIS — G4730 Sleep apnea, unspecified: Secondary | ICD-10-CM | POA: Insufficient documentation

## 2024-06-02 DIAGNOSIS — I2510 Atherosclerotic heart disease of native coronary artery without angina pectoris: Secondary | ICD-10-CM | POA: Insufficient documentation

## 2024-06-02 DIAGNOSIS — J4489 Other specified chronic obstructive pulmonary disease: Secondary | ICD-10-CM | POA: Insufficient documentation

## 2024-06-02 DIAGNOSIS — I252 Old myocardial infarction: Secondary | ICD-10-CM | POA: Diagnosis not present

## 2024-06-02 HISTORY — PX: ESOPHAGOGASTRODUODENOSCOPY: SHX5428

## 2024-06-02 HISTORY — PX: BIOPSY OF SKIN SUBCUTANEOUS TISSUE AND/OR MUCOUS MEMBRANE: SHX6741

## 2024-06-02 LAB — GLUCOSE, CAPILLARY: Glucose-Capillary: 174 mg/dL — ABNORMAL HIGH (ref 70–99)

## 2024-06-02 SURGERY — EGD (ESOPHAGOGASTRODUODENOSCOPY)
Anesthesia: General

## 2024-06-02 MED ORDER — SODIUM CHLORIDE 0.9 % IV SOLN: INTRAVENOUS | Status: DC

## 2024-06-02 MED ORDER — PROPOFOL 500 MG/50ML IV EMUL
INTRAVENOUS | Status: DC | PRN
  Administered 2024-06-02: 100 mg via INTRAVENOUS
  Administered 2024-06-02: 125 ug/kg/min via INTRAVENOUS

## 2024-06-02 MED ORDER — LIDOCAINE HCL (CARDIAC) PF 100 MG/5ML IV SOSY
PREFILLED_SYRINGE | INTRAVENOUS | Status: DC | PRN
  Administered 2024-06-02: 50 mg via INTRAVENOUS

## 2024-06-02 NOTE — Anesthesia Procedure Notes (Signed)
 Date/Time: 06/02/2024 12:23 PM  Performed by: Dominica Krabbe, CRNAPre-anesthesia Checklist: Patient identified, Emergency Drugs available, Suction available, Patient being monitored and Timeout performed Patient Re-evaluated:Patient Re-evaluated prior to induction Oxygen Delivery Method: Nasal cannula Preoxygenation: Pre-oxygenation with 100% oxygen Induction Type: IV induction

## 2024-06-02 NOTE — Anesthesia Preprocedure Evaluation (Addendum)
 "                                  Anesthesia Evaluation  Patient identified by MRN, date of birth, ID band Patient awake    Reviewed: Allergy & Precautions, NPO status , Patient's Chart, lab work & pertinent test results  Airway Mallampati: II  TM Distance: >3 FB Neck ROM: Full    Dental  (+) Teeth Intact   Pulmonary sleep apnea , COPD, former smoker   Pulmonary exam normal        Cardiovascular Exercise Tolerance: Good hypertension, + CAD, + Past MI, + Cardiac Stents and + CABG  Normal cardiovascular exam Rhythm:Regular Rate:Normal     Neuro/Psych    Depression    negative neurological ROS  negative psych ROS   GI/Hepatic negative GI ROS,GERD  ,,(+)     substance abuse  alcohol use  Endo/Other  diabetes, Type 2, Oral Hypoglycemic Agents    Renal/GU CRFRenal disease  negative genitourinary   Musculoskeletal   Abdominal   Peds negative pediatric ROS (+)  Hematology negative hematology ROS (+)   Anesthesia Other Findings Past Medical History: No date: Alcoholism (HCC) No date: Asthma No date: CAD (coronary artery disease) No date: Cardiac arrest (HCC) No date: Chronic cardiac arrhythmia No date: Chronic kidney disease No date: COPD (chronic obstructive pulmonary disease) (HCC) No date: Depression No date: Diabetes mellitus without complication (HCC) No date: Essential tremor No date: GERD (gastroesophageal reflux disease) No date: Heart attack (HCC) No date: Heart murmur No date: Hyperlipidemia No date: Hypertension No date: IBS (irritable bowel syndrome) 2010: Shoulder problem     Comment:  right No date: Sinoatrial node dysfunction (HCC) No date: Sleep apnea  Past Surgical History: 06/11/2017: BALLOON DILATION; N/A     Comment:  Procedure: BALLOON DILATION;  Surgeon: Viktoria Lamar DASEN, MD;  Location: [facility] ENDOSCOPY;  Service: Endoscopy;                Laterality: N/A; 01/10/2021: COLONOSCOPY WITH PROPOFOL ; N/A      Comment:  Procedure: COLONOSCOPY WITH PROPOFOL ;  Surgeon:               Maryruth Ole DASEN, MD;  Location: [facility] ENDOSCOPY;                Service: Endoscopy;  Laterality: N/A; No date: CORONARY ANGIOPLASTY WITH STENT PLACEMENT 01/16/2018: CORONARY STENT INTERVENTION; N/A     Comment:  Procedure: CORONARY STENT INTERVENTION;  Surgeon:               Florencio Cara BIRCH, MD;  Location: [facility] INVASIVE CV LAB;               Service: Cardiovascular;  Laterality: N/A; 10/06/2020: CORONARY STENT INTERVENTION; N/A     Comment:  Procedure: CORONARY STENT INTERVENTION;  Surgeon:               Ammon Blunt, MD;  Location: [facility] INVASIVE CV               LAB;  Service: Cardiovascular;  Laterality: N/A;  LAD 05/02/2023: CORONARY STENT INTERVENTION; N/A     Comment:  Procedure: CORONARY STENT INTERVENTION;  Surgeon:               Ammon Blunt, MD;  Location: [facility] INVASIVE CV  LAB;  Service: Cardiovascular;  Laterality: N/A; 06/11/2017: ESOPHAGOGASTRODUODENOSCOPY (EGD) WITH PROPOFOL ; N/A     Comment:  Procedure: ESOPHAGOGASTRODUODENOSCOPY (EGD) WITH               PROPOFOL ;  Surgeon: Viktoria Lamar DASEN, MD;  Location:               [facility] ENDOSCOPY;  Service: Endoscopy;  Laterality: N/A; 01/16/2018: LEFT HEART CATH AND CORONARY ANGIOGRAPHY; Left     Comment:  Procedure: LEFT HEART CATH AND CORONARY ANGIOGRAPHY;                Surgeon: Hester Wolm PARAS, MD;  Location: [facility] INVASIVE               CV LAB;  Service: Cardiovascular;  Laterality: Left; 10/06/2020: LEFT HEART CATH AND CORONARY ANGIOGRAPHY; N/A     Comment:  Procedure: LEFT HEART CATH AND CORONARY ANGIOGRAPHY;                Surgeon: Hester Wolm PARAS, MD;  Location: [facility] INVASIVE               CV LAB;  Service: Cardiovascular;  Laterality: N/A; 06/27/2021: LEFT HEART CATH AND CORONARY ANGIOGRAPHY; N/A     Comment:  Procedure: LEFT HEART CATH AND CORONARY ANGIOGRAPHY;                Surgeon: Hester Wolm PARAS, MD;   Location: [facility] INVASIVE               CV LAB;  Service: Cardiovascular;  Laterality: N/A; 05/02/2023: LEFT HEART CATH AND CORS/GRAFTS ANGIOGRAPHY; Left     Comment:  Procedure: LEFT HEART CATH AND CORS/GRAFTS ANGIOGRAPHY;               Surgeon: Ammon Blunt, MD;  Location: [facility]               INVASIVE CV LAB;  Service: Cardiovascular;  Laterality:               Left; No date: RHINOPLASTY No date: SHOULDER SURGERY No date: SIGMOIDOSCOPY  BMI    Body Mass Index: 27.41 kg/m      Reproductive/Obstetrics negative OB ROS                              Anesthesia Physical Anesthesia Plan  ASA: 3  Anesthesia Plan: General   Post-op Pain Management:    Induction: Intravenous  PONV Risk Score and Plan: Propofol  infusion and TIVA  Airway Management Planned: Natural Airway and Nasal Cannula  Additional Equipment:   Intra-op Plan:   Post-operative Plan:   Informed Consent: I have reviewed the patients History and Physical, chart, labs and discussed the procedure including the risks, benefits and alternatives for the proposed anesthesia with the patient or authorized representative who has indicated his/her understanding and acceptance.     Dental Advisory Given  Plan Discussed with: CRNA  Anesthesia Plan Comments:          Anesthesia Quick Evaluation  "

## 2024-06-02 NOTE — Interval H&P Note (Signed)
 History and Physical Interval Note:  06/02/2024 11:57 AM  Frank Wong  has presented today for surgery, with the diagnosis of Esophageal dysphagia (R13.19).  The various methods of treatment have been discussed with the patient and family. After consideration of risks, benefits and other options for treatment, the patient has consented to  Procedures: EGD (ESOPHAGOGASTRODUODENOSCOPY) (N/A) as a surgical intervention.  The patient's history has been reviewed, patient examined, no change in status, stable for surgery.  I have reviewed the patient's chart and labs.  Questions were answered to the patient's satisfaction.     Frank Wong  Ok to proceed with EGD

## 2024-06-02 NOTE — Op Note (Signed)
 [facility] Patient Name: Frank Wong Procedure Date: 06/02/2024 12:19 PM MRN: [id number] Account #: [id number] Date of Birth: [birth year] Admit Type: Outpatient Age: 69 Room: [facility] ENDO ROOM 3 Gender: Male Note Status: Finalized Instrument Name: Upper GI Scope [id number] Procedure:             Upper GI endoscopy Indications:           Dysphagia Providers:             Ole Schick MD, MD Referring MD:          Norleen RONAL Louder, MD (Referring MD) Medicines:             Monitored Anesthesia Care Complications:         No immediate complications. Estimated blood loss:                         Minimal. Procedure:             Pre-Anesthesia Assessment:                        - Prior to the procedure, a History and Physical was                         performed, and patient medications and allergies were                         reviewed. The patient is competent. The risks and                         benefits of the procedure and the sedation options and                         risks were discussed with the patient. All questions                         were answered and informed consent was obtained.                         Patient identification and proposed procedure were                         verified by the physician, the nurse, the                         anesthesiologist, the anesthetist and the technician                         in the endoscopy suite. Mental Status Examination:                         alert and oriented. Airway Examination: normal                         oropharyngeal airway and neck mobility. Respiratory                         Examination: clear to auscultation. CV Examination:  normal. Prophylactic Antibiotics: The patient does not                         require prophylactic antibiotics. Prior                         Anticoagulants: The patient has taken no anticoagulant                         or  antiplatelet agents. ASA Grade Assessment: III - A                         patient with severe systemic disease. After reviewing                         the risks and benefits, the patient was deemed in                         satisfactory condition to undergo the procedure. The                         anesthesia plan was to use monitored anesthesia care                         (MAC). Immediately prior to administration of                         medications, the patient was re-assessed for adequacy                         to receive sedatives. The heart rate, respiratory                         rate, oxygen saturations, blood pressure, adequacy of                         pulmonary ventilation, and response to care were                         monitored throughout the procedure. The physical                         status of the patient was re-assessed after the                         procedure.                        After obtaining informed consent, the endoscope was                         passed under direct vision. Throughout the procedure,                         the patient's blood pressure, pulse, and oxygen                         saturations were monitored continuously. The Endoscope  was introduced through the mouth, and advanced to the                         second part of duodenum. The upper GI endoscopy was                         accomplished without difficulty. The patient tolerated                         the procedure well. Findings:      The lower third of the esophagus was mildly tortuous.      A small hiatal hernia was present.      The exam of the esophagus was otherwise normal.      A few localized small erosions with no stigmata of recent bleeding were       found in the gastric antrum. Biopsies were taken with a cold forceps for       histology. Estimated blood loss was minimal.      The examined duodenum was normal. Impression:             - Tortuous esophagus.                        - Small hiatal hernia.                        - Erosive gastropathy with no stigmata of recent                         bleeding. Biopsied.                        - Normal examined duodenum. Recommendation:        - Discharge patient to home.                        - Resume previous diet.                        - Continue present medications.                        - Await pathology results.                        - Perform a barium swallow using barium in liquid and                         tablet form if symptoms persist.                        - Return to referring physician as previously                         scheduled. Procedure Code(s):     --- Professional ---                        [phone], Esophagogastroduodenoscopy, flexible,                         transoral; with biopsy, single or multiple Diagnosis  Code(s):     --- Professional ---                        Q39.9, Congenital malformation of esophagus,                         unspecified                        K44.9, Diaphragmatic hernia without obstruction or                         gangrene                        K31.89, Other diseases of stomach and duodenum                        R13.10, Dysphagia, unspecified CPT copyright 2022 American Medical Association. All rights reserved. The codes documented in this report are preliminary and upon coder review may  be revised to meet current compliance requirements. Ole Schick MD, MD 06/02/2024 12:37:47 PM Number of Addenda: 0 Note Initiated On: 06/02/2024 12:19 PM Estimated Blood Loss:  Estimated blood loss was minimal.      [facility]

## 2024-06-02 NOTE — Transfer of Care (Signed)
 Immediate Anesthesia Transfer of Care Note  Patient: Frank Wong  Procedure(s) Performed: EGD (ESOPHAGOGASTRODUODENOSCOPY)  Patient Location: Endoscopy Unit  Anesthesia Type:General  Level of Consciousness: sedated, unresponsive, and drowsy  Airway & Oxygen Therapy: Patient Spontanous Breathing  Post-op Assessment: Report given to RN and Post -op Vital signs reviewed and stable  Post vital signs: Reviewed and stable  Last Vitals:  Vitals Value Taken Time  BP 98/63 06/02/24 12:38  Temp 35.7 C 06/02/24 12:37  Pulse 72 06/02/24 12:38  Resp 16 06/02/24 12:38  SpO2 94 % 06/02/24 12:38    Last Pain:  Vitals:   06/02/24 1237  TempSrc: Temporal  PainSc:          Complications: No notable events documented.

## 2024-06-02 NOTE — H&P (Signed)
 Outpatient short stay form Pre-procedure 06/02/2024  Frank ONEIDA Schick, MD  Primary Physician: Rudolpho Norleen BIRCH, MD  Reason for visit:  Dysphagia  History of present illness:    69 y/o gentleman with history of CAD, hypertension, CKD, and DM II here for EGD for solid food dysphagia. Denies blood thinners. No neck surgeries. No known family history of GI malignancies.   Current Medications[1]  Medications Prior to Admission  Medication Sig Dispense Refill Last Dose/Taking   acidophilus (RISAQUAD) CAPS capsule Take 1 capsule by mouth daily.   06/01/2024   aspirin  EC 81 MG tablet Take 81 mg by mouth in the morning.   06/01/2024   atorvastatin  (LIPITOR ) 80 MG tablet Take 80 mg by mouth.   06/01/2024   b complex vitamins capsule Take 1 capsule by mouth daily.   06/01/2024   B Complex-C (B-COMPLEX WITH VITAMIN C) tablet Take 1 tablet by mouth in the morning.   06/01/2024   Cholecalciferol (VITAMIN D-1000 MAX ST) 25 MCG (1000 UT) tablet Take 1,000 Units by mouth daily.   06/01/2024   clonazePAM  (KLONOPIN ) 0.25 MG disintegrating tablet Take 0.25 mg by mouth.   Past Month   Dulaglutide 3 MG/0.5ML SOAJ Inject 3 mg into the skin.   05/26/2024   empagliflozin  (JARDIANCE ) 25 MG TABS tablet Take 25 mg by mouth daily.   06/01/2024   fexofenadine (ALLEGRA) 180 MG tablet Take 180 mg by mouth in the morning.   06/01/2024   Fluticasone -Umeclidin-Vilant 100-62.5-25 MCG/ACT AEPB Inhale 1 puff into the lungs daily.   06/02/2024   gabapentin  (NEURONTIN ) 600 MG tablet Take 600 mg by mouth 2 (two) times daily.   06/01/2024   glipiZIDE  (GLUCOTROL  XL) 2.5 MG 24 hr tablet Take 2.5 mg by mouth.   06/01/2024   isosorbide mononitrate (IMDUR) 30 MG 24 hr tablet Take 30 mg by mouth daily.   06/01/2024   levalbuterol (XOPENEX HFA) 45 MCG/ACT inhaler Inhale 1-2 puffs into the lungs.   Past Month   lisinopril (ZESTRIL) 2.5 MG tablet Take 2.5 mg by mouth daily.   06/01/2024   metFORMIN (GLUCOPHAGE) 1000 MG tablet Take 1,000 mg by mouth 2 (two)  times daily with a meal.    06/01/2024   metoprolol succinate (TOPROL-XL) 25 MG 24 hr tablet Take 12.5 mg by mouth daily.   06/02/2024 at  6:00 AM   montelukast  (SINGULAIR ) 10 MG tablet Take 10 mg by mouth at bedtime.   06/01/2024   Multiple Vitamin (MULTIVITAMIN WITH MINERALS) TABS tablet Take 1 tablet by mouth in the morning.   06/01/2024   pantoprazole  (PROTONIX ) 40 MG tablet Take 40 mg by mouth at bedtime.   06/01/2024   ranolazine (RANEXA) 500 MG 12 hr tablet Take 500 mg by mouth 2 (two) times daily.   06/01/2024   sertraline (ZOLOFT) 50 MG tablet Take 50 mg by mouth daily.   06/01/2024   TRULICITY 1.5 MG/0.5ML SOAJ Inject into the skin.   Past Week   ACCU-CHEK GUIDE TEST test strip SMARTSIG:Strip(s)      alendronate-cholecalciferol (FOSAMAX PLUS D) 70-5600 MG-UNIT tablet Take 1 tablet by mouth. (Patient not taking: Reported on 06/02/2024)   Not Taking   AMBULATORY NON FORMULARY MEDICATION Medication Name: Trimix (30/1/10)-(Pap/Phent/PGE)  Dosage: Inject 0.8 cc per injection   Vial 1ml  Qty #10 Refills 6  [facility] [phone] Fax [phone] 10 mL 0    amphetamine-dextroamphetamine (ADDERALL XR) 10 MG 24 hr capsule Take 10 mg by mouth daily.  Crisaborole  (EUCRISA ) 2 % OINT Apply to aa's rash BID PRN. 60 g 5    fluticasone  (FLONASE ) 50 MCG/ACT nasal spray Place 1 spray into both nostrils daily for 14 days. 15 mL 0    levocetirizine (XYZAL ) 5 MG tablet Take 5 mg by mouth every evening.      magnesium  oxide (MAG-OX) 400 MG tablet Take 400 mg by mouth at bedtime.   05/31/2024   memantine (NAMENDA) 5 MG tablet Oral; Duration: 90 Days      nitroGLYCERIN  (NITROSTAT ) 0.4 MG SL tablet Place 0.4 mg under the tongue every 5 (five) minutes as needed for chest pain.      NON FORMULARY Pt uses a bi-pap nightly      pseudoephedrine  (SUDAFED) 60 MG tablet Take 1 tablet (60 mg total) by mouth every 6 (six) hours as needed. 30 tablet 0    Testosterone  20.25 MG/ACT (1.62%) GEL Apply 2 pumps daily in  the morning to clean, dry, intact skin of the shoulders, upper arms, and/or abdomen. Do NOT apply to genitals, chest, armpits, knees, or back. Wash hands immediately after application. Cover the application site with clothing after the gel dries to prevent secondary exposure to others 75 g 3    traZODone (DESYREL) 50 MG tablet Take 50 mg by mouth at bedtime.      triamcinolone cream (KENALOG) 0.1 % 1 Application.        Allergies[2]   Past Medical History:  Diagnosis Date   Alcoholism (HCC)    Asthma    CAD (coronary artery disease)    Cardiac arrest (HCC)    Chronic cardiac arrhythmia    Chronic kidney disease    COPD (chronic obstructive pulmonary disease) (HCC)    Depression    Diabetes mellitus without complication (HCC)    Essential tremor    GERD (gastroesophageal reflux disease)    Heart attack (HCC)    Heart murmur    Hyperlipidemia    Hypertension    IBS (irritable bowel syndrome)    Shoulder problem 2010   right   Sinoatrial node dysfunction (HCC)    Sleep apnea     Review of systems:  Otherwise negative.    Physical Exam  Gen: Alert, oriented. Appears stated age.  HEENT: PERRLA. Lungs: No respiratory distress CV: RRR Abd: soft, benign, no masses Ext: No edema    Planned procedures: Proceed with EGD. The patient understands the nature of the planned procedure, indications, risks, alternatives and potential complications including but not limited to bleeding, infection, perforation, damage to internal organs and possible oversedation/side effects from anesthesia. The patient agrees and gives consent to proceed.  Please refer to procedure notes for findings, recommendations and patient disposition/instructions.     Frank ONEIDA Schick, MD [facility]         [1]  Current Facility-Administered Medications:    0.9 %  sodium chloride  infusion, , Intravenous, Continuous, Rin Gorton, Frank ONEIDA, MD, Last Rate: 20 mL/hr at 06/02/24 1153, New  Bag at 06/02/24 1153 [2]  Allergies Allergen Reactions   Penicillamine Hives   Penicillins Other (See Comments)    Has patient had a PCN reaction causing immediate rash, facial/tongue/throat swelling, SOB or lightheadedness with hypotension: No Has patient had a PCN reaction causing severe rash involving mucus membranes or skin necrosis: No Has patient had a PCN reaction that required hospitalization No Has patient had a PCN reaction occurring within the last 10 years: No If all of the above answers are NO, then  may proceed with Cephalosporin use.

## 2024-06-02 NOTE — Anesthesia Postprocedure Evaluation (Signed)
"   Anesthesia Post Note  Patient: Frank Wong  Procedure(s) Performed: EGD (ESOPHAGOGASTRODUODENOSCOPY) BIOPSY, GI  Patient location during evaluation: PACU Anesthesia Type: General Level of consciousness: awake Pain management: satisfactory to patient Vital Signs Assessment: post-procedure vital signs reviewed and stable Respiratory status: spontaneous breathing Cardiovascular status: stable Anesthetic complications: no   No notable events documented.   Last Vitals:  Vitals:   06/02/24 1255 06/02/24 1307  BP: 123/86 125/78  Pulse: 73   Resp: 18   Temp:    SpO2: 93% 93%    Last Pain:  Vitals:   06/02/24 1255  TempSrc:   PainSc: 0-No pain                 VAN STAVEREN,Skipper Dacosta      "

## 2024-06-03 ENCOUNTER — Other Ambulatory Visit

## 2024-06-03 DIAGNOSIS — N401 Enlarged prostate with lower urinary tract symptoms: Secondary | ICD-10-CM

## 2024-06-03 LAB — SURGICAL PATHOLOGY

## 2024-06-04 LAB — TESTOSTERONE: Testosterone: 258 ng/dL — ABNORMAL LOW (ref 264–916)

## 2024-06-05 ENCOUNTER — Other Ambulatory Visit: Payer: Self-pay | Admitting: Urology

## 2024-06-05 DIAGNOSIS — E291 Testicular hypofunction: Secondary | ICD-10-CM

## 2024-06-05 MED ORDER — TESTOSTERONE 20.25 MG/ACT (1.62%) TD GEL: TRANSDERMAL | 3 refills | Status: AC

## 2024-07-28 ENCOUNTER — Other Ambulatory Visit

## 2025-03-25 ENCOUNTER — Ambulatory Visit: Admitting: Dermatology
